# Patient Record
Sex: Male | Born: 1945 | Race: White | Hispanic: No | Smoking: Never smoker
Health system: Southern US, Community
[De-identification: ages and names within clinical notes are randomized; demographics above are authoritative.]

## PROBLEM LIST (undated history)

## (undated) DIAGNOSIS — I10 Essential (primary) hypertension: Secondary | ICD-10-CM

## (undated) DIAGNOSIS — I639 Cerebral infarction, unspecified: Secondary | ICD-10-CM

## (undated) DIAGNOSIS — T7840XA Allergy, unspecified, initial encounter: Secondary | ICD-10-CM

## (undated) HISTORY — PX: HERNIA REPAIR: SHX51

## (undated) HISTORY — DX: Cerebral infarction, unspecified: I63.9

## (undated) HISTORY — DX: Essential (primary) hypertension: I10

## (undated) HISTORY — DX: Allergy, unspecified, initial encounter: T78.40XA

## (undated) SURGERY — Surgical Case
Anesthesia: *Unknown

## (undated) SURGERY — POSTERIOR CERVICAL FUSION/FORAMINOTOMY LEVEL 5
Anesthesia: General | Laterality: Bilateral

---

## 2012-03-19 DIAGNOSIS — I69919 Unspecified symptoms and signs involving cognitive functions following unspecified cerebrovascular disease: Secondary | ICD-10-CM | POA: Diagnosis not present

## 2012-03-19 DIAGNOSIS — Z7901 Long term (current) use of anticoagulants: Secondary | ICD-10-CM | POA: Diagnosis not present

## 2012-03-21 DIAGNOSIS — E559 Vitamin D deficiency, unspecified: Secondary | ICD-10-CM | POA: Diagnosis not present

## 2012-03-21 DIAGNOSIS — E669 Obesity, unspecified: Secondary | ICD-10-CM | POA: Diagnosis not present

## 2012-03-21 DIAGNOSIS — R7301 Impaired fasting glucose: Secondary | ICD-10-CM | POA: Diagnosis not present

## 2012-03-21 DIAGNOSIS — E785 Hyperlipidemia, unspecified: Secondary | ICD-10-CM | POA: Diagnosis not present

## 2012-03-21 DIAGNOSIS — L719 Rosacea, unspecified: Secondary | ICD-10-CM | POA: Diagnosis not present

## 2012-03-21 DIAGNOSIS — I6990 Unspecified sequelae of unspecified cerebrovascular disease: Secondary | ICD-10-CM | POA: Diagnosis not present

## 2012-03-21 DIAGNOSIS — I10 Essential (primary) hypertension: Secondary | ICD-10-CM | POA: Diagnosis not present

## 2012-03-21 DIAGNOSIS — L2089 Other atopic dermatitis: Secondary | ICD-10-CM | POA: Diagnosis not present

## 2012-04-24 DIAGNOSIS — S239XXA Sprain of unspecified parts of thorax, initial encounter: Secondary | ICD-10-CM | POA: Diagnosis not present

## 2012-04-24 DIAGNOSIS — M545 Low back pain: Secondary | ICD-10-CM | POA: Diagnosis not present

## 2012-04-24 DIAGNOSIS — M5137 Other intervertebral disc degeneration, lumbosacral region: Secondary | ICD-10-CM | POA: Diagnosis not present

## 2012-04-24 DIAGNOSIS — S298XXA Other specified injuries of thorax, initial encounter: Secondary | ICD-10-CM | POA: Diagnosis not present

## 2012-04-24 DIAGNOSIS — S335XXA Sprain of ligaments of lumbar spine, initial encounter: Secondary | ICD-10-CM | POA: Diagnosis not present

## 2012-04-24 DIAGNOSIS — IMO0002 Reserved for concepts with insufficient information to code with codable children: Secondary | ICD-10-CM | POA: Diagnosis not present

## 2012-04-24 DIAGNOSIS — W07XXXA Fall from chair, initial encounter: Secondary | ICD-10-CM | POA: Diagnosis not present

## 2012-04-24 DIAGNOSIS — Z7901 Long term (current) use of anticoagulants: Secondary | ICD-10-CM | POA: Diagnosis not present

## 2012-04-24 DIAGNOSIS — Z8673 Personal history of transient ischemic attack (TIA), and cerebral infarction without residual deficits: Secondary | ICD-10-CM | POA: Diagnosis not present

## 2012-05-22 DIAGNOSIS — Z7901 Long term (current) use of anticoagulants: Secondary | ICD-10-CM | POA: Diagnosis not present

## 2012-05-22 DIAGNOSIS — I69919 Unspecified symptoms and signs involving cognitive functions following unspecified cerebrovascular disease: Secondary | ICD-10-CM | POA: Diagnosis not present

## 2012-10-24 DIAGNOSIS — H11249 Scarring of conjunctiva, unspecified eye: Secondary | ICD-10-CM | POA: Diagnosis not present

## 2012-10-24 DIAGNOSIS — I6350 Cerebral infarction due to unspecified occlusion or stenosis of unspecified cerebral artery: Secondary | ICD-10-CM | POA: Diagnosis not present

## 2012-11-13 DIAGNOSIS — E785 Hyperlipidemia, unspecified: Secondary | ICD-10-CM | POA: Diagnosis not present

## 2012-11-13 DIAGNOSIS — Z7901 Long term (current) use of anticoagulants: Secondary | ICD-10-CM | POA: Diagnosis not present

## 2013-01-16 DIAGNOSIS — Z7901 Long term (current) use of anticoagulants: Secondary | ICD-10-CM | POA: Diagnosis not present

## 2013-01-28 DIAGNOSIS — I8310 Varicose veins of unspecified lower extremity with inflammation: Secondary | ICD-10-CM | POA: Diagnosis not present

## 2013-01-28 DIAGNOSIS — L719 Rosacea, unspecified: Secondary | ICD-10-CM | POA: Diagnosis not present

## 2013-04-15 DIAGNOSIS — Z7901 Long term (current) use of anticoagulants: Secondary | ICD-10-CM | POA: Diagnosis not present

## 2013-05-15 DIAGNOSIS — Z7901 Long term (current) use of anticoagulants: Secondary | ICD-10-CM | POA: Diagnosis not present

## 2013-06-16 DIAGNOSIS — Z7901 Long term (current) use of anticoagulants: Secondary | ICD-10-CM | POA: Diagnosis not present

## 2013-07-17 DIAGNOSIS — I10 Essential (primary) hypertension: Secondary | ICD-10-CM | POA: Diagnosis not present

## 2013-07-17 DIAGNOSIS — Z7901 Long term (current) use of anticoagulants: Secondary | ICD-10-CM | POA: Diagnosis not present

## 2013-07-17 DIAGNOSIS — I6350 Cerebral infarction due to unspecified occlusion or stenosis of unspecified cerebral artery: Secondary | ICD-10-CM | POA: Diagnosis not present

## 2013-08-12 DIAGNOSIS — Z7901 Long term (current) use of anticoagulants: Secondary | ICD-10-CM | POA: Diagnosis not present

## 2013-08-12 DIAGNOSIS — Z0000 Encounter for general adult medical examination without abnormal findings: Secondary | ICD-10-CM | POA: Diagnosis not present

## 2013-08-12 DIAGNOSIS — Z5181 Encounter for therapeutic drug level monitoring: Secondary | ICD-10-CM | POA: Diagnosis not present

## 2013-08-12 DIAGNOSIS — I10 Essential (primary) hypertension: Secondary | ICD-10-CM | POA: Diagnosis not present

## 2013-09-17 DIAGNOSIS — E785 Hyperlipidemia, unspecified: Secondary | ICD-10-CM | POA: Diagnosis not present

## 2013-09-17 DIAGNOSIS — Z7901 Long term (current) use of anticoagulants: Secondary | ICD-10-CM | POA: Diagnosis not present

## 2013-10-22 DIAGNOSIS — Z7901 Long term (current) use of anticoagulants: Secondary | ICD-10-CM | POA: Diagnosis not present

## 2013-11-27 DIAGNOSIS — Z7901 Long term (current) use of anticoagulants: Secondary | ICD-10-CM | POA: Diagnosis not present

## 2013-12-24 DIAGNOSIS — Z7901 Long term (current) use of anticoagulants: Secondary | ICD-10-CM | POA: Diagnosis not present

## 2014-01-28 DIAGNOSIS — Z7901 Long term (current) use of anticoagulants: Secondary | ICD-10-CM | POA: Diagnosis not present

## 2014-02-03 DIAGNOSIS — E785 Hyperlipidemia, unspecified: Secondary | ICD-10-CM | POA: Diagnosis not present

## 2014-02-03 DIAGNOSIS — I10 Essential (primary) hypertension: Secondary | ICD-10-CM | POA: Diagnosis not present

## 2014-02-03 DIAGNOSIS — R319 Hematuria, unspecified: Secondary | ICD-10-CM | POA: Diagnosis not present

## 2014-02-03 DIAGNOSIS — Z7901 Long term (current) use of anticoagulants: Secondary | ICD-10-CM | POA: Diagnosis not present

## 2014-03-04 DIAGNOSIS — Z7901 Long term (current) use of anticoagulants: Secondary | ICD-10-CM | POA: Diagnosis not present

## 2014-03-31 DIAGNOSIS — Z7901 Long term (current) use of anticoagulants: Secondary | ICD-10-CM | POA: Diagnosis not present

## 2014-05-13 DIAGNOSIS — Z7901 Long term (current) use of anticoagulants: Secondary | ICD-10-CM | POA: Diagnosis not present

## 2014-06-17 DIAGNOSIS — Z5181 Encounter for therapeutic drug level monitoring: Secondary | ICD-10-CM | POA: Diagnosis not present

## 2014-07-13 DIAGNOSIS — J309 Allergic rhinitis, unspecified: Secondary | ICD-10-CM | POA: Diagnosis not present

## 2014-07-13 DIAGNOSIS — I639 Cerebral infarction, unspecified: Secondary | ICD-10-CM | POA: Diagnosis not present

## 2014-07-13 DIAGNOSIS — I10 Essential (primary) hypertension: Secondary | ICD-10-CM | POA: Diagnosis not present

## 2014-08-04 DIAGNOSIS — Z5181 Encounter for therapeutic drug level monitoring: Secondary | ICD-10-CM | POA: Diagnosis not present

## 2014-08-25 DIAGNOSIS — Z5181 Encounter for therapeutic drug level monitoring: Secondary | ICD-10-CM | POA: Diagnosis not present

## 2014-09-22 DIAGNOSIS — Z5181 Encounter for therapeutic drug level monitoring: Secondary | ICD-10-CM | POA: Diagnosis not present

## 2014-10-28 DIAGNOSIS — Z5181 Encounter for therapeutic drug level monitoring: Secondary | ICD-10-CM | POA: Diagnosis not present

## 2014-11-11 DIAGNOSIS — Z5181 Encounter for therapeutic drug level monitoring: Secondary | ICD-10-CM | POA: Diagnosis not present

## 2014-12-24 DIAGNOSIS — Z5181 Encounter for therapeutic drug level monitoring: Secondary | ICD-10-CM | POA: Diagnosis not present

## 2015-02-03 ENCOUNTER — Other Ambulatory Visit: Payer: Medicare Other

## 2015-02-03 DIAGNOSIS — Z7901 Long term (current) use of anticoagulants: Secondary | ICD-10-CM | POA: Diagnosis not present

## 2015-02-04 LAB — PROTIME-INR
INR: 2.2 — AB (ref 0.8–1.2)
PROTHROMBIN TIME: 23.5 s — AB (ref 9.1–12.0)

## 2015-02-15 ENCOUNTER — Other Ambulatory Visit: Payer: Self-pay | Admitting: Family Medicine

## 2015-02-15 DIAGNOSIS — I10 Essential (primary) hypertension: Secondary | ICD-10-CM

## 2015-02-28 ENCOUNTER — Other Ambulatory Visit: Payer: Self-pay | Admitting: Family Medicine

## 2015-02-28 DIAGNOSIS — I10 Essential (primary) hypertension: Secondary | ICD-10-CM

## 2015-03-28 ENCOUNTER — Other Ambulatory Visit: Payer: Self-pay | Admitting: Family Medicine

## 2015-03-28 DIAGNOSIS — I4891 Unspecified atrial fibrillation: Secondary | ICD-10-CM

## 2015-03-31 ENCOUNTER — Other Ambulatory Visit: Payer: Medicare Other

## 2015-03-31 DIAGNOSIS — Z7901 Long term (current) use of anticoagulants: Secondary | ICD-10-CM | POA: Diagnosis not present

## 2015-04-01 LAB — PROTIME-INR
INR: 2 — AB (ref 0.8–1.2)
Prothrombin Time: 20.9 s — ABNORMAL HIGH (ref 9.1–12.0)

## 2015-05-18 ENCOUNTER — Other Ambulatory Visit: Payer: Self-pay | Admitting: Family Medicine

## 2015-05-26 ENCOUNTER — Other Ambulatory Visit: Payer: Medicare Other

## 2015-05-26 DIAGNOSIS — Z7901 Long term (current) use of anticoagulants: Secondary | ICD-10-CM | POA: Diagnosis not present

## 2015-05-27 LAB — PROTIME-INR
INR: 2 — AB (ref 0.8–1.2)
Prothrombin Time: 20.4 s — ABNORMAL HIGH (ref 9.1–12.0)

## 2015-05-28 ENCOUNTER — Other Ambulatory Visit: Payer: Self-pay | Admitting: Family Medicine

## 2015-06-02 ENCOUNTER — Ambulatory Visit (INDEPENDENT_AMBULATORY_CARE_PROVIDER_SITE_OTHER): Payer: Medicare Other | Admitting: Family Medicine

## 2015-06-02 ENCOUNTER — Encounter: Payer: Self-pay | Admitting: Family Medicine

## 2015-06-02 VITALS — BP 110/70 | HR 80 | Ht 70.0 in

## 2015-06-02 DIAGNOSIS — R69 Illness, unspecified: Secondary | ICD-10-CM

## 2015-06-02 DIAGNOSIS — I4891 Unspecified atrial fibrillation: Secondary | ICD-10-CM | POA: Diagnosis not present

## 2015-06-02 DIAGNOSIS — J301 Allergic rhinitis due to pollen: Secondary | ICD-10-CM

## 2015-06-02 DIAGNOSIS — Z5181 Encounter for therapeutic drug level monitoring: Secondary | ICD-10-CM

## 2015-06-02 DIAGNOSIS — Z7901 Long term (current) use of anticoagulants: Secondary | ICD-10-CM | POA: Diagnosis not present

## 2015-06-02 DIAGNOSIS — I679 Cerebrovascular disease, unspecified: Secondary | ICD-10-CM | POA: Diagnosis not present

## 2015-06-02 DIAGNOSIS — I10 Essential (primary) hypertension: Secondary | ICD-10-CM | POA: Diagnosis not present

## 2015-06-02 MED ORDER — ATENOLOL 25 MG PO TABS: 25.0000 mg | ORAL_TABLET | Freq: Every day | ORAL | Status: DC

## 2015-06-02 MED ORDER — LISINOPRIL 40 MG PO TABS: 40.0000 mg | ORAL_TABLET | Freq: Every day | ORAL | Status: DC

## 2015-06-02 MED ORDER — WARFARIN SODIUM 3 MG PO TABS: 3.0000 mg | ORAL_TABLET | Freq: Every day | ORAL | Status: DC

## 2015-06-02 MED ORDER — FLUTICASONE PROPIONATE 50 MCG/ACT NA SUSP: 1.0000 | Freq: Every day | NASAL | Status: DC

## 2015-06-02 NOTE — Progress Notes (Signed)
Name: [name]   MRN: [id number]    DOB: 1945/11/05   Date:06/02/2015       Progress Note  Subjective  Chief Complaint  Chief Complaint  Patient presents with  . Hypertension  . Cerebrovascular Accident  . Allergic Rhinitis     Hypertension This is a chronic problem. The current episode started more than 1 year ago. The problem has been gradually improving since onset. The problem is controlled. Pertinent negatives include no anxiety, blurred vision, chest pain, headaches, malaise/fatigue, neck pain, orthopnea, palpitations, peripheral edema, PND, shortness of breath or sweats. There are no associated agents to hypertension. Risk factors for coronary artery disease include dyslipidemia and obesity. Past treatments include ACE inhibitors and beta blockers. The current treatment provides mild improvement. There are no compliance problems.  Hypertensive end-organ damage includes CVA. There is no history of angina, kidney disease, CAD/MI, heart failure, left ventricular hypertrophy, PVD, renovascular disease or retinopathy. There is no history of chronic renal disease.  Cerebrovascular Accident This is a chronic problem. The current episode started more than 1 year ago. The problem has been gradually improving. Pertinent negatives include no abdominal pain, anorexia, arthralgias, change in bowel habit, chest pain, chills, congestion, coughing, diaphoresis, fatigue, fever, headaches, joint swelling, myalgias, nausea, neck pain, numbness, rash, sore throat, urinary symptoms, vertigo, visual change, vomiting or weakness. Nothing aggravates the symptoms. He has tried nothing for the symptoms. The treatment provided mild relief.  Hyperlipidemia This is a chronic problem. The problem is controlled. Recent lipid tests were reviewed and are normal. He has no history of chronic renal disease. Pertinent negatives include no chest pain, focal weakness, myalgias or shortness of breath. The current treatment  provides moderate improvement of lipids. There are no compliance problems.  Risk factors for coronary artery disease include hypertension.    No problem-specific assessment & plan notes found for this encounter.   Past Medical History  Diagnosis Date  . Allergy   . Stroke (HCC)   . Hypertension     Past Surgical History  Procedure Laterality Date  . Hernia repair      History reviewed. No pertinent family history.  Social History   Social History  . Marital Status: Widowed    Spouse Name: N/A  . Number of Children: N/A  . Years of Education: N/A   Occupational History  . Not on file.   Social History Main Topics  . Smoking status: Never Smoker   . Smokeless tobacco: Not on file  . Alcohol Use: No  . Drug Use: No  . Sexual Activity: Not Currently   Other Topics Concern  . Not on file   Social History Narrative  . No narrative on file    Allergies  Allergen Reactions  . Latex      Review of Systems  Constitutional: Negative for fever, chills, weight loss, malaise/fatigue, diaphoresis and fatigue.  HENT: Negative for congestion, ear discharge, ear pain and sore throat.   Eyes: Negative for blurred vision.  Respiratory: Negative for cough, sputum production, shortness of breath and wheezing.   Cardiovascular: Negative for chest pain, palpitations, orthopnea, leg swelling and PND.  Gastrointestinal: Negative for heartburn, nausea, vomiting, abdominal pain, diarrhea, constipation, blood in stool, melena, anorexia and change in bowel habit.  Genitourinary: Negative for dysuria, urgency, frequency and hematuria.  Musculoskeletal: Negative for myalgias, back pain, joint pain, joint swelling, arthralgias and neck pain.  Skin: Negative for rash.  Neurological: Negative for dizziness, vertigo, tingling, sensory change, focal weakness,  weakness, numbness and headaches.  Endo/Heme/Allergies: Negative for environmental allergies and polydipsia. Does not bruise/bleed  easily.  Psychiatric/Behavioral: Negative for depression and suicidal ideas. The patient is not nervous/anxious and does not have insomnia.      Objective  Filed Vitals:   06/02/15 1012  BP: 110/70  Pulse: 80  Height: 5\' 10"  (1.778 m)    Physical Exam  Constitutional: He is oriented to person, place, and time and well-developed, well-nourished, and in no distress.  HENT:  Head: Normocephalic.  Right Ear: External ear normal.  Left Ear: External ear normal.  Nose: Nose normal.  Mouth/Throat: Oropharynx is clear and moist.  Eyes: Conjunctivae and EOM are normal. Pupils are equal, round, and reactive to light. Right eye exhibits no discharge. Left eye exhibits no discharge. No scleral icterus.  Neck: Normal range of motion. Neck supple. No JVD present. No tracheal deviation present. No thyromegaly present.  Cardiovascular: Normal rate, regular rhythm, normal heart sounds and intact distal pulses.  Exam reveals no gallop and no friction rub.   No murmur heard. Pulmonary/Chest: Breath sounds normal. No respiratory distress. He has no wheezes. He has no rales.  Abdominal: Soft. Bowel sounds are normal. He exhibits no mass. There is no hepatosplenomegaly. There is no tenderness. There is no rebound, no guarding and no CVA tenderness.  Musculoskeletal: Normal range of motion. He exhibits no edema or tenderness.  Lymphadenopathy:    He has no cervical adenopathy.  Neurological: He is alert and oriented to person, place, and time. He has normal sensation, normal strength and intact cranial nerves. No cranial nerve deficit.  Skin: Skin is warm. No rash noted.  Psychiatric: Mood and affect normal.  Nursing note and vitals reviewed.     Assessment & Plan  Problem List Items Addressed This Visit    None    Visit Diagnoses    Anticoagulated on Coumadin    -  Primary    Relevant Orders    INR/PT    Essential hypertension        Relevant Medications    atenolol (TENORMIN) 25 MG  tablet    lisinopril (PRINIVIL,ZESTRIL) 40 MG tablet    warfarin (COUMADIN) 3 MG tablet    Other Relevant Orders    Renal Function Panel    Allergic rhinitis due to pollen        Relevant Medications    fluticasone (FLONASE) 50 MCG/ACT nasal spray    Cerebral vascular disease        Relevant Medications    atenolol (TENORMIN) 25 MG tablet    lisinopril (PRINIVIL,ZESTRIL) 40 MG tablet    warfarin (COUMADIN) 3 MG tablet    Other Relevant Orders    INR/PT    Atrial fibrillation, unspecified        Relevant Medications    atenolol (TENORMIN) 25 MG tablet    lisinopril (PRINIVIL,ZESTRIL) 40 MG tablet    warfarin (COUMADIN) 3 MG tablet    Taking medication for chronic disease        Relevant Orders    INR/PT         Dr. Hayden Rasmusseneanna [place]on [facility]  06/02/2015

## 2015-06-03 LAB — RENAL FUNCTION PANEL
Albumin: 3.9 g/dL (ref 3.6–4.8)
BUN/Creatinine Ratio: 17 (ref 10–22)
BUN: 16 mg/dL (ref 8–27)
CALCIUM: 9.6 mg/dL (ref 8.6–10.2)
CO2: 26 mmol/L (ref 18–29)
CREATININE: 0.95 mg/dL (ref 0.76–1.27)
Chloride: 99 mmol/L (ref 97–106)
GFR calc Af Amer: 95 mL/min/{1.73_m2} (ref >59)
GFR, EST NON AFRICAN AMERICAN: 82 mL/min/{1.73_m2} (ref >59)
Glucose: 132 mg/dL — ABNORMAL HIGH (ref 65–99)
PHOSPHORUS: 2.5 mg/dL (ref 2.5–4.5)
Potassium: 4.3 mmol/L (ref 3.5–5.2)
SODIUM: 141 mmol/L (ref 136–144)

## 2015-06-03 LAB — PROTIME-INR
INR: 2 — ABNORMAL HIGH (ref 0.8–1.2)
PROTHROMBIN TIME: 20.6 s — AB (ref 9.1–12.0)

## 2015-07-21 ENCOUNTER — Other Ambulatory Visit: Payer: Medicare Other

## 2015-07-21 DIAGNOSIS — Z7901 Long term (current) use of anticoagulants: Secondary | ICD-10-CM | POA: Diagnosis not present

## 2015-07-22 LAB — PROTIME-INR
INR: 1.8 — AB (ref 0.8–1.2)
Prothrombin Time: 18.3 s — ABNORMAL HIGH (ref 9.1–12.0)

## 2015-08-18 ENCOUNTER — Other Ambulatory Visit: Payer: Medicare Other | Admitting: Family Medicine

## 2015-08-18 DIAGNOSIS — Z7901 Long term (current) use of anticoagulants: Secondary | ICD-10-CM | POA: Diagnosis not present

## 2015-08-19 LAB — PROTIME-INR
INR: 2.3 — ABNORMAL HIGH (ref 0.8–1.2)
Prothrombin Time: 23.9 s — ABNORMAL HIGH (ref 9.1–12.0)

## 2015-09-29 ENCOUNTER — Other Ambulatory Visit: Payer: Medicare Other

## 2015-09-29 DIAGNOSIS — Z7901 Long term (current) use of anticoagulants: Secondary | ICD-10-CM

## 2015-09-30 LAB — PROTIME-INR
INR: 2.9 — ABNORMAL HIGH (ref 0.8–1.2)
PROTHROMBIN TIME: 29.9 s — AB (ref 9.1–12.0)

## 2015-11-10 ENCOUNTER — Other Ambulatory Visit: Payer: Medicare Other

## 2015-11-10 DIAGNOSIS — Z7901 Long term (current) use of anticoagulants: Secondary | ICD-10-CM

## 2015-11-11 LAB — PROTIME-INR
INR: 2.7 — ABNORMAL HIGH (ref 0.8–1.2)
Prothrombin Time: 27.8 s — ABNORMAL HIGH (ref 9.1–12.0)

## 2015-11-30 ENCOUNTER — Ambulatory Visit (INDEPENDENT_AMBULATORY_CARE_PROVIDER_SITE_OTHER): Payer: Medicare Other | Admitting: Family Medicine

## 2015-11-30 ENCOUNTER — Encounter: Payer: Self-pay | Admitting: Family Medicine

## 2015-11-30 VITALS — BP 120/64 | HR 76 | Ht 71.0 in | Wt 241.0 lb

## 2015-11-30 DIAGNOSIS — E785 Hyperlipidemia, unspecified: Secondary | ICD-10-CM

## 2015-11-30 DIAGNOSIS — I679 Cerebrovascular disease, unspecified: Secondary | ICD-10-CM | POA: Diagnosis not present

## 2015-11-30 DIAGNOSIS — I10 Essential (primary) hypertension: Secondary | ICD-10-CM | POA: Diagnosis not present

## 2015-11-30 DIAGNOSIS — Z7901 Long term (current) use of anticoagulants: Secondary | ICD-10-CM

## 2015-11-30 DIAGNOSIS — I4891 Unspecified atrial fibrillation: Secondary | ICD-10-CM | POA: Diagnosis not present

## 2015-11-30 DIAGNOSIS — J301 Allergic rhinitis due to pollen: Secondary | ICD-10-CM | POA: Diagnosis not present

## 2015-11-30 MED ORDER — ATENOLOL 25 MG PO TABS: 25.0000 mg | ORAL_TABLET | Freq: Every day | ORAL | Status: DC

## 2015-11-30 MED ORDER — FLUTICASONE PROPIONATE 50 MCG/ACT NA SUSP: 1.0000 | Freq: Every day | NASAL | Status: DC

## 2015-11-30 MED ORDER — LISINOPRIL 40 MG PO TABS: 40.0000 mg | ORAL_TABLET | Freq: Every day | ORAL | Status: DC

## 2015-11-30 MED ORDER — WARFARIN SODIUM 3 MG PO TABS: 3.0000 mg | ORAL_TABLET | Freq: Every day | ORAL | Status: DC

## 2015-11-30 NOTE — Progress Notes (Signed)
Name: [name]   MRN: [id number]    DOB: 12/03/1945   Date:11/30/2015       Progress Note  Subjective  Chief Complaint  Chief Complaint  Patient presents with  . Hypertension  . Allergic Rhinitis   . Anticoagulation    refill coumadin    Hypertension This is a chronic problem. The current episode started more than 1 year ago. The problem has been gradually improving since onset. The problem is controlled. Pertinent negatives include no anxiety, blurred vision, chest pain, headaches, malaise/fatigue, neck pain, orthopnea, palpitations, peripheral edema, PND, shortness of breath or sweats. There are no associated agents to hypertension. There are no known risk factors for coronary artery disease.    No problem-specific assessment & plan notes found for this encounter.   Past Medical History  Diagnosis Date  . Allergy   . Stroke (HCC)   . Hypertension     Past Surgical History  Procedure Laterality Date  . Hernia repair      History reviewed. No pertinent family history.  Social History   Social History  . Marital Status: Widowed    Spouse Name: N/A  . Number of Children: N/A  . Years of Education: N/A   Occupational History  . Not on file.   Social History Main Topics  . Smoking status: Never Smoker   . Smokeless tobacco: Not on file  . Alcohol Use: No  . Drug Use: No  . Sexual Activity: Not Currently   Other Topics Concern  . Not on file   Social History Narrative    Allergies  Allergen Reactions  . Latex      Review of Systems  Constitutional: Negative for malaise/fatigue.  Eyes: Negative for blurred vision.  Respiratory: Negative for shortness of breath.   Cardiovascular: Negative for chest pain, palpitations, orthopnea and PND.  Musculoskeletal: Negative for neck pain.  Neurological: Negative for headaches.     Objective  Filed Vitals:   11/30/15 1115  BP: 120/64  Pulse: 76  Height:  (1.803 m)  Weight: 241 lb (109.317 kg)     Physical Exam  Constitutional: He is oriented to person, place, and time and well-developed, well-nourished, and in no distress.  HENT:  Head: Normocephalic.  Right Ear: External ear normal.  Left Ear: External ear normal.  Nose: Nose normal.  Mouth/Throat: Oropharynx is clear and moist.  Eyes: Conjunctivae and EOM are normal. Pupils are equal, round, and reactive to light. Right eye exhibits no discharge. Left eye exhibits no discharge. No scleral icterus.  Neck: Normal range of motion. Neck supple. No JVD present. No tracheal deviation present. No thyromegaly present.  Cardiovascular: Normal rate, regular rhythm, normal heart sounds and intact distal pulses.  Exam reveals no gallop and no friction rub.   No murmur heard. Pulmonary/Chest: Breath sounds normal. No respiratory distress. He has no wheezes. He has no rales.  Abdominal: Soft. Bowel sounds are normal. He exhibits no mass. There is no hepatosplenomegaly. There is no tenderness. There is no rebound, no guarding and no CVA tenderness.  Musculoskeletal: Normal range of motion. He exhibits no edema or tenderness.  Lymphadenopathy:    He has no cervical adenopathy.  Neurological: He is alert and oriented to person, place, and time. He has normal sensation, normal strength and intact cranial nerves. No cranial nerve deficit.  Skin: Skin is warm. No rash noted.  Psychiatric: Mood and affect normal.  Nursing note and vitals reviewed.     Assessment &  Plan  Problem List Items Addressed This Visit    None    Visit Diagnoses    Essential hypertension    -  Primary    Relevant Medications    atenolol (TENORMIN) 25 MG tablet    lisinopril (PRINIVIL,ZESTRIL) 40 MG tablet    warfarin (COUMADIN) 3 MG tablet    Other Relevant Orders    Renal Function Panel    Cerebral vascular disease        Relevant Medications    atenolol (TENORMIN) 25 MG tablet    lisinopril (PRINIVIL,ZESTRIL) 40 MG tablet    warfarin (COUMADIN) 3 MG  tablet    Other Relevant Orders    Lipid Profile    Anticoagulant long-term use        Relevant Orders    INR/PT    Allergic rhinitis due to pollen        Seasonal allergic rhinitis due to pollen        Relevant Medications    fluticasone (FLONASE) 50 MCG/ACT nasal spray    Atrial fibrillation, unspecified        Relevant Medications    atenolol (TENORMIN) 25 MG tablet    lisinopril (PRINIVIL,ZESTRIL) 40 MG tablet    warfarin (COUMADIN) 3 MG tablet    Hyperlipidemia        Relevant Medications    atenolol (TENORMIN) 25 MG tablet    lisinopril (PRINIVIL,ZESTRIL) 40 MG tablet    warfarin (COUMADIN) 3 MG tablet    Other Relevant Orders    Lipid Profile         Dr. Hayden Rasmusseneanna [place] [facility]o [facility]  11/30/2015

## 2015-12-01 LAB — RENAL FUNCTION PANEL
ALBUMIN: 3.9 g/dL (ref 3.6–4.8)
BUN/Creatinine Ratio: 16 (ref 10–24)
BUN: 16 mg/dL (ref 8–27)
CALCIUM: 9.6 mg/dL (ref 8.6–10.2)
CHLORIDE: 99 mmol/L (ref 96–106)
CO2: 29 mmol/L (ref 18–29)
Creatinine, Ser: 0.97 mg/dL (ref 0.76–1.27)
GFR calc non Af Amer: 80 mL/min/{1.73_m2} (ref >59)
GFR, EST AFRICAN AMERICAN: 92 mL/min/{1.73_m2} (ref >59)
Glucose: 149 mg/dL — ABNORMAL HIGH (ref 65–99)
PHOSPHORUS: 2.4 mg/dL — AB (ref 2.5–4.5)
POTASSIUM: 4.6 mmol/L (ref 3.5–5.2)
Sodium: 142 mmol/L (ref 134–144)

## 2015-12-01 LAB — LIPID PANEL
CHOLESTEROL TOTAL: 167 mg/dL (ref 100–199)
Chol/HDL Ratio: 4.3 ratio units (ref 0.0–5.0)
HDL: 39 mg/dL — AB (ref >39)
LDL Calculated: 96 mg/dL (ref 0–99)
TRIGLYCERIDES: 158 mg/dL — AB (ref 0–149)
VLDL CHOLESTEROL CAL: 32 mg/dL (ref 5–40)

## 2015-12-01 LAB — PROTIME-INR
INR: 2.8 — ABNORMAL HIGH (ref 0.8–1.2)
Prothrombin Time: 28.1 s — ABNORMAL HIGH (ref 9.1–12.0)

## 2016-01-12 ENCOUNTER — Other Ambulatory Visit: Payer: Medicare Other

## 2016-01-12 DIAGNOSIS — Z7901 Long term (current) use of anticoagulants: Secondary | ICD-10-CM | POA: Diagnosis not present

## 2016-01-13 LAB — PROTIME-INR
INR: 2 — ABNORMAL HIGH (ref 0.8–1.2)
PROTHROMBIN TIME: 20 s — AB (ref 9.1–12.0)

## 2016-02-23 ENCOUNTER — Other Ambulatory Visit: Payer: Medicare Other

## 2016-02-23 ENCOUNTER — Other Ambulatory Visit: Payer: Self-pay | Admitting: Internal Medicine

## 2016-02-23 DIAGNOSIS — D689 Coagulation defect, unspecified: Secondary | ICD-10-CM | POA: Diagnosis not present

## 2016-02-23 DIAGNOSIS — Z7901 Long term (current) use of anticoagulants: Secondary | ICD-10-CM | POA: Insufficient documentation

## 2016-02-24 LAB — PROTIME-INR
INR: 2.3 — AB (ref 0.8–1.2)
PROTHROMBIN TIME: 23.5 s — AB (ref 9.1–12.0)

## 2016-04-05 ENCOUNTER — Other Ambulatory Visit: Payer: Medicare Other

## 2016-04-05 DIAGNOSIS — Z7901 Long term (current) use of anticoagulants: Secondary | ICD-10-CM | POA: Diagnosis not present

## 2016-04-06 ENCOUNTER — Other Ambulatory Visit: Payer: Self-pay

## 2016-04-06 LAB — PROTIME-INR
INR: 3.2 — ABNORMAL HIGH (ref 0.8–1.2)
Prothrombin Time: 31.5 s — ABNORMAL HIGH (ref 9.1–12.0)

## 2016-04-26 ENCOUNTER — Other Ambulatory Visit: Payer: Medicare Other

## 2016-04-26 DIAGNOSIS — Z7901 Long term (current) use of anticoagulants: Secondary | ICD-10-CM

## 2016-04-27 LAB — PROTIME-INR
INR: 2.1 — AB (ref 0.8–1.2)
Prothrombin Time: 21.3 s — ABNORMAL HIGH (ref 9.1–12.0)

## 2016-05-30 ENCOUNTER — Other Ambulatory Visit: Payer: Self-pay | Admitting: Family Medicine

## 2016-05-30 DIAGNOSIS — I4891 Unspecified atrial fibrillation: Secondary | ICD-10-CM

## 2016-05-31 ENCOUNTER — Encounter: Payer: Self-pay | Admitting: Family Medicine

## 2016-05-31 ENCOUNTER — Ambulatory Visit (INDEPENDENT_AMBULATORY_CARE_PROVIDER_SITE_OTHER): Payer: Medicare Other | Admitting: Family Medicine

## 2016-05-31 VITALS — BP 120/70 | HR 76 | Ht 71.0 in | Wt 247.0 lb

## 2016-05-31 DIAGNOSIS — Z7901 Long term (current) use of anticoagulants: Secondary | ICD-10-CM

## 2016-05-31 DIAGNOSIS — J301 Allergic rhinitis due to pollen: Secondary | ICD-10-CM | POA: Diagnosis not present

## 2016-05-31 DIAGNOSIS — I10 Essential (primary) hypertension: Secondary | ICD-10-CM | POA: Diagnosis not present

## 2016-05-31 DIAGNOSIS — I679 Cerebrovascular disease, unspecified: Secondary | ICD-10-CM

## 2016-05-31 MED ORDER — ATENOLOL 25 MG PO TABS: 25.0000 mg | ORAL_TABLET | Freq: Every day | ORAL | 1 refills | Status: DC

## 2016-05-31 MED ORDER — WARFARIN SODIUM 3 MG PO TABS: 3.0000 mg | ORAL_TABLET | Freq: Every day | ORAL | 1 refills | Status: DC

## 2016-05-31 MED ORDER — FLUTICASONE PROPIONATE 50 MCG/ACT NA SUSP
1.0000 | Freq: Every day | NASAL | 11 refills | Status: DC
Start: 2016-05-31 — End: 2016-11-30

## 2016-05-31 MED ORDER — LISINOPRIL 40 MG PO TABS: 40.0000 mg | ORAL_TABLET | Freq: Every day | ORAL | 1 refills | Status: DC

## 2016-05-31 NOTE — Progress Notes (Signed)
Name: [name]   MRN: [id number]    DOB: 08/26/1945   Date:05/31/2016       Progress Note  Subjective  Chief Complaint  Chief Complaint  Patient presents with  . Hypertension  . Allergic Rhinitis   . Cerebrovascular Accident    takes Warfarin for prevention    Patient has history of atrial fibrillation.   Hypertension  This is a chronic problem. The current episode started more than 1 year ago. The problem has been gradually improving since onset. The problem is controlled. Pertinent negatives include no anxiety, blurred vision, chest pain, headaches, malaise/fatigue, neck pain, orthopnea, palpitations, peripheral edema, PND, shortness of breath or sweats. There are no associated agents to hypertension. Past treatments include beta blockers and ACE inhibitors. The current treatment provides mild improvement. There are no compliance problems.  There is no history of angina, kidney disease, CAD/MI, CVA, heart failure, left ventricular hypertrophy, PVD, renovascular disease or retinopathy. There is no history of chronic renal disease or a hypertension causing med.  Cerebrovascular Accident  This is a chronic problem. The current episode started more than 1 year ago. The problem has been gradually improving. Pertinent negatives include no abdominal pain, anorexia, arthralgias, change in bowel habit, chest pain, chills, congestion, coughing, diaphoresis, fatigue, fever, headaches, joint swelling, myalgias, nausea, neck pain, numbness, rash, sore throat, swollen glands, urinary symptoms, vertigo, visual change, vomiting or weakness. Nothing aggravates the symptoms. He has tried nothing for the symptoms. The treatment provided mild relief.    No problem-specific Assessment & Plan notes found for this encounter.   Past Medical History:  Diagnosis Date  . Allergy   . Hypertension   . Stroke [facility](HCC)     Past Surgical History:  Procedure Laterality Date  . HERNIA REPAIR      No family  history on file.  Social History   Social History  . Marital status: Widowed    Spouse name: N/A  . Number of children: N/A  . Years of education: N/A   Occupational History  . Not on file.   Social History Main Topics  . Smoking status: Never Smoker  . Smokeless tobacco: Not on file  . Alcohol use No  . Drug use: No  . Sexual activity: Not Currently   Other Topics Concern  . Not on file   Social History Narrative  . No narrative on file    Allergies  Allergen Reactions  . Latex      Review of Systems  Constitutional: Negative for chills, diaphoresis, fatigue, fever, malaise/fatigue and weight loss.  HENT: Negative for congestion, ear discharge, ear pain and sore throat.   Eyes: Negative for blurred vision.  Respiratory: Negative for cough, sputum production, shortness of breath and wheezing.   Cardiovascular: Negative for chest pain, palpitations, orthopnea, leg swelling and PND.  Gastrointestinal: Negative for abdominal pain, anorexia, blood in stool, change in bowel habit, constipation, diarrhea, heartburn, melena, nausea and vomiting.  Genitourinary: Negative for dysuria, frequency, hematuria and urgency.  Musculoskeletal: Negative for arthralgias, back pain, joint pain, joint swelling, myalgias and neck pain.  Skin: Negative for rash.  Neurological: Negative for dizziness, vertigo, tingling, sensory change, focal weakness, weakness, numbness and headaches.  Endo/Heme/Allergies: Negative for environmental allergies and polydipsia. Does not bruise/bleed easily.  Psychiatric/Behavioral: Negative for depression and suicidal ideas. The patient is not nervous/anxious and does not have insomnia.      Objective  Vitals:   05/31/16 1046  BP: 120/70  Pulse: 76  Weight:  247 lb (112 kg)  Height: 5\' 11"  (1.803 m)    Physical Exam  Constitutional: He is oriented to person, place, and time and well-developed, well-nourished, and in no distress.  HENT:  Head:  Normocephalic.  Right Ear: External ear normal.  Left Ear: External ear normal.  Nose: Nose normal.  Mouth/Throat: Oropharynx is clear and moist.  Eyes: Conjunctivae and EOM are normal. Pupils are equal, round, and reactive to light. Right eye exhibits no discharge. Left eye exhibits no discharge. No scleral icterus.  Neck: Normal range of motion. Neck supple. No JVD present. No tracheal deviation present. No thyromegaly present.  Cardiovascular: Normal rate, regular rhythm, normal heart sounds and intact distal pulses.  Exam reveals no gallop and no friction rub.   No murmur heard. Pulmonary/Chest: Breath sounds normal. No respiratory distress. He has no wheezes. He has no rales.  Abdominal: Soft. Bowel sounds are normal. He exhibits no mass. There is no hepatosplenomegaly. There is no tenderness. There is no rebound, no guarding and no CVA tenderness.  Musculoskeletal: Normal range of motion. He exhibits no edema or tenderness.  Lymphadenopathy:    He has no cervical adenopathy.  Neurological: He is alert and oriented to person, place, and time. He has normal sensation, normal strength, normal reflexes and intact cranial nerves. No cranial nerve deficit.  Skin: Skin is warm. No rash noted.  Psychiatric: Mood and affect normal.  Nursing note and vitals reviewed.     Assessment & Plan  Problem List Items Addressed This Visit    None    Visit Diagnoses    Essential hypertension    -  Primary   Relevant Medications   warfarin (COUMADIN) 3 MG tablet   atenolol (TENORMIN) 25 MG tablet   lisinopril (PRINIVIL,ZESTRIL) 40 MG tablet   Other Relevant Orders   Renal Function Panel   Cerebral vascular disease       Relevant Medications   warfarin (COUMADIN) 3 MG tablet   atenolol (TENORMIN) 25 MG tablet   lisinopril (PRINIVIL,ZESTRIL) 40 MG tablet   Other Relevant Orders   INR/PT   Acute seasonal allergic rhinitis due to pollen       Chronic seasonal allergic rhinitis due to pollen        Relevant Medications   fluticasone (FLONASE) 50 MCG/ACT nasal spray   Anticoagulant long-term use       Relevant Orders   INR/PT        Dr. Hayden [facility]  05/31/16

## 2016-06-01 LAB — RENAL FUNCTION PANEL
Albumin: 4 g/dL (ref 3.6–4.8)
BUN / CREAT RATIO: 18 (ref 10–24)
BUN: 18 mg/dL (ref 8–27)
CHLORIDE: 99 mmol/L (ref 96–106)
CO2: 28 mmol/L (ref 18–29)
Calcium: 9.6 mg/dL (ref 8.6–10.2)
Creatinine, Ser: 1.01 mg/dL (ref 0.76–1.27)
GFR calc non Af Amer: 76 mL/min/{1.73_m2} (ref >59)
GFR, EST AFRICAN AMERICAN: 87 mL/min/{1.73_m2} (ref >59)
GLUCOSE: 171 mg/dL — AB (ref 65–99)
POTASSIUM: 4.8 mmol/L (ref 3.5–5.2)
Phosphorus: 2.4 mg/dL — ABNORMAL LOW (ref 2.5–4.5)
SODIUM: 141 mmol/L (ref 134–144)

## 2016-06-01 LAB — PROTIME-INR
INR: 2.2 — ABNORMAL HIGH (ref 0.8–1.2)
Prothrombin Time: 22.4 s — ABNORMAL HIGH (ref 9.1–12.0)

## 2016-07-13 ENCOUNTER — Other Ambulatory Visit: Payer: Medicare Other

## 2016-07-13 DIAGNOSIS — D689 Coagulation defect, unspecified: Secondary | ICD-10-CM

## 2016-07-14 LAB — PROTIME-INR
INR: 2 — AB (ref 0.8–1.2)
Prothrombin Time: 20.7 s — ABNORMAL HIGH (ref 9.1–12.0)

## 2016-08-30 ENCOUNTER — Other Ambulatory Visit: Payer: Self-pay

## 2016-08-30 ENCOUNTER — Other Ambulatory Visit: Payer: Medicare Other

## 2016-08-30 DIAGNOSIS — Z7901 Long term (current) use of anticoagulants: Secondary | ICD-10-CM

## 2016-08-31 ENCOUNTER — Other Ambulatory Visit: Payer: Self-pay | Admitting: Family Medicine

## 2016-08-31 DIAGNOSIS — I4891 Unspecified atrial fibrillation: Secondary | ICD-10-CM

## 2016-08-31 LAB — PROTIME-INR
INR: 2.3 — ABNORMAL HIGH (ref 0.8–1.2)
Prothrombin Time: 23.1 s — ABNORMAL HIGH (ref 9.1–12.0)

## 2016-10-04 ENCOUNTER — Other Ambulatory Visit: Payer: Medicare Other

## 2016-10-04 DIAGNOSIS — Z7901 Long term (current) use of anticoagulants: Secondary | ICD-10-CM | POA: Diagnosis not present

## 2016-10-05 LAB — PROTIME-INR
INR: 1.8 — ABNORMAL HIGH (ref 0.8–1.2)
Prothrombin Time: 18.3 s — ABNORMAL HIGH (ref 9.1–12.0)

## 2016-11-01 ENCOUNTER — Other Ambulatory Visit: Payer: Medicare Other

## 2016-11-01 DIAGNOSIS — Z7901 Long term (current) use of anticoagulants: Secondary | ICD-10-CM

## 2016-11-01 DIAGNOSIS — R69 Illness, unspecified: Secondary | ICD-10-CM | POA: Diagnosis not present

## 2016-11-02 LAB — PROTIME-INR
INR: 1.9 — AB (ref 0.8–1.2)
PROTHROMBIN TIME: 19.3 s — AB (ref 9.1–12.0)

## 2016-11-26 ENCOUNTER — Other Ambulatory Visit: Payer: Self-pay | Admitting: Family Medicine

## 2016-11-30 ENCOUNTER — Ambulatory Visit (INDEPENDENT_AMBULATORY_CARE_PROVIDER_SITE_OTHER): Payer: Medicare Other | Admitting: Family Medicine

## 2016-11-30 ENCOUNTER — Encounter: Payer: Self-pay | Admitting: Family Medicine

## 2016-11-30 VITALS — BP 120/70 | HR 88 | Ht 71.0 in | Wt 238.0 lb

## 2016-11-30 DIAGNOSIS — I10 Essential (primary) hypertension: Secondary | ICD-10-CM | POA: Diagnosis not present

## 2016-11-30 DIAGNOSIS — I679 Cerebrovascular disease, unspecified: Secondary | ICD-10-CM | POA: Diagnosis not present

## 2016-11-30 DIAGNOSIS — Z7901 Long term (current) use of anticoagulants: Secondary | ICD-10-CM

## 2016-11-30 DIAGNOSIS — J301 Allergic rhinitis due to pollen: Secondary | ICD-10-CM

## 2016-11-30 MED ORDER — LISINOPRIL 40 MG PO TABS: 40.0000 mg | ORAL_TABLET | Freq: Every day | ORAL | 1 refills | Status: DC

## 2016-11-30 MED ORDER — ATENOLOL 25 MG PO TABS: 25.0000 mg | ORAL_TABLET | Freq: Every day | ORAL | 1 refills | Status: DC

## 2016-11-30 MED ORDER — WARFARIN SODIUM 3 MG PO TABS: 3.0000 mg | ORAL_TABLET | Freq: Every day | ORAL | 1 refills | Status: DC

## 2016-11-30 MED ORDER — FLUTICASONE PROPIONATE 50 MCG/ACT NA SUSP: 1.0000 | Freq: Every day | NASAL | 11 refills | Status: DC

## 2016-11-30 NOTE — Progress Notes (Signed)
Name: Ronav Furney   MRN: [id number]    DOB: 10-Aug-1945   Date:11/30/2016       Progress Note  Subjective  Chief Complaint  Chief Complaint  Patient presents with  . Hypertension  . Allergic Rhinitis   . Cerebrovascular Accident    takes warfarin for    Hypertension  This is a chronic problem. The current episode started more than 1 year ago. The problem has been waxing and waning since onset. The problem is controlled. Pertinent negatives include no anxiety, blurred vision, chest pain, headaches, malaise/fatigue, neck pain, orthopnea, palpitations, peripheral edema, PND, shortness of breath or sweats. There are no associated agents to hypertension. Risk factors for coronary artery disease include dyslipidemia. Past treatments include ACE inhibitors and beta blockers. The current treatment provides mild improvement. There are no compliance problems.  There is no history of angina, kidney disease, CAD/MI, CVA, heart failure, left ventricular hypertrophy, PVD or retinopathy. There is no history of chronic renal disease, a hypertension causing med or renovascular disease.  Cerebrovascular Accident  This is a chronic problem. The current episode started more than 1 year ago. Pertinent negatives include no abdominal pain, chest pain, chills, coughing, fever, headaches, myalgias, nausea, neck pain, rash or sore throat. Nothing aggravates the symptoms.    No problem-specific Assessment & Plan notes found for this encounter.   Past Medical History:  Diagnosis Date  . Allergy   . Hypertension   . Stroke [facility])     Past Surgical History:  Procedure Laterality Date  . HERNIA REPAIR      No family history on file.  Social History   Social History  . Marital status: Widowed    Spouse name: N/A  . Number of children: N/A  . Years of education: N/A   Occupational History  . Not on file.   Social History Main Topics  . Smoking status: Never Smoker  . Smokeless tobacco: Never Used   . Alcohol use No  . Drug use: No  . Sexual activity: Not Currently   Other Topics Concern  . Not on file   Social History Narrative  . No narrative on file    Allergies  Allergen Reactions  . Latex     Outpatient Medications Prior to Visit  Medication Sig Dispense Refill  . atenolol (TENORMIN) 25 MG tablet Take 1 tablet (25 mg total) by mouth daily. 90 tablet 1  . fluticasone (FLONASE) 50 MCG/ACT nasal spray Place 1 spray into both nostrils daily. 1 g 11  . lisinopril (PRINIVIL,ZESTRIL) 40 MG tablet Take 1 tablet (40 mg total) by mouth daily. 90 tablet 1  . warfarin (COUMADIN) 3 MG tablet Take 1 tablet (3 mg total) by mouth daily. 90 tablet 1  . warfarin (COUMADIN) 3 MG tablet TAKE 1 TABLET DAILY (SCHEDULE APPOINTMENT TO BE SEEN) 90 tablet 0  . warfarin (COUMADIN) 3 MG tablet TAKE 1 TABLET DAILY (SCHEDULE APPOINTMENT TO BE SEEN) 90 tablet 0   No facility-administered medications prior to visit.     Review of Systems  Constitutional: Negative for chills, fever, malaise/fatigue and weight loss.  HENT: Negative for ear discharge, ear pain and sore throat.   Eyes: Negative for blurred vision.  Respiratory: Negative for cough, sputum production, shortness of breath and wheezing.   Cardiovascular: Negative for chest pain, palpitations, orthopnea, leg swelling and PND.  Gastrointestinal: Negative for abdominal pain, blood in stool, constipation, diarrhea, heartburn, melena and nausea.  Genitourinary: Negative for dysuria, frequency, hematuria and urgency.  Musculoskeletal: Negative for back pain, joint pain, myalgias and neck pain.  Skin: Negative for rash.  Neurological: Negative for dizziness, tingling, sensory change, focal weakness and headaches.  Endo/Heme/Allergies: Negative for environmental allergies and polydipsia. Does not bruise/bleed easily.  Psychiatric/Behavioral: Negative for depression and suicidal ideas. The patient is not nervous/anxious and does not have  insomnia.      Objective  Vitals:   11/30/16 1011  BP: 120/70  Pulse: 88  Weight: 238 lb (108 kg)  Height:  (1.803 m)    Physical Exam  Constitutional: He is oriented to person, place, and time and well-developed, well-nourished, and in no distress.  HENT:  Head: Normocephalic.  Right Ear: External ear normal.  Left Ear: External ear normal.  Nose: Nose normal.  Mouth/Throat: Oropharynx is clear and moist.  Eyes: Conjunctivae and EOM are normal. Pupils are equal, round, and reactive to light. Right eye exhibits no discharge. Left eye exhibits no discharge. No scleral icterus.  Neck: Normal range of motion. Neck supple. No JVD present. No tracheal deviation present. No thyromegaly present.  Cardiovascular: Normal rate, regular rhythm, normal heart sounds and intact distal pulses.  Exam reveals no gallop and no friction rub.   No murmur heard. Pulmonary/Chest: Breath sounds normal. No respiratory distress. He has no wheezes. He has no rales.  Abdominal: Soft. Bowel sounds are normal. He exhibits no mass. There is no hepatosplenomegaly. There is no tenderness. There is no rebound, no guarding and no CVA tenderness.  Musculoskeletal: Normal range of motion. He exhibits no edema or tenderness.  Lymphadenopathy:    He has no cervical adenopathy.  Neurological: He is alert and oriented to person, place, and time. He has normal sensation, normal strength, normal reflexes and intact cranial nerves. No cranial nerve deficit.  Skin: Skin is warm. No rash noted.  Psychiatric: Mood and affect normal.  Nursing note and vitals reviewed.     Assessment & Plan  Problem List Items Addressed This Visit    None    Visit Diagnoses    Essential hypertension    -  Primary   Relevant Medications   warfarin (COUMADIN) 3 MG tablet   lisinopril (PRINIVIL,ZESTRIL) 40 MG tablet   atenolol (TENORMIN) 25 MG tablet   Other Relevant Orders   Renal Function Panel   Lipid panel   Cerebral  vascular disease       Relevant Medications   warfarin (COUMADIN) 3 MG tablet   lisinopril (PRINIVIL,ZESTRIL) 40 MG tablet   atenolol (TENORMIN) 25 MG tablet   Other Relevant Orders   Protime-INR   Chronic seasonal allergic rhinitis due to pollen       Relevant Medications   fluticasone (FLONASE) 50 MCG/ACT nasal spray   Chronic anticoagulation       Relevant Orders   Protime-INR      Meds ordered this encounter  Medications  . DISCONTD: atenolol (TENORMIN) 25 MG tablet    Sig: Take 1 tablet (25 mg total) by mouth daily.    Dispense:  90 tablet    Refill:  1  . DISCONTD: lisinopril (PRINIVIL,ZESTRIL) 40 MG tablet    Sig: Take 1 tablet (40 mg total) by mouth daily.    Dispense:  90 tablet    Refill:  1  . DISCONTD: warfarin (COUMADIN) 3 MG tablet    Sig: Take 1 tablet (3 mg total) by mouth daily.    Dispense:  90 tablet    Refill:  1    sched appt  to be seen  . DISCONTD: fluticasone (FLONASE) 50 MCG/ACT nasal spray    Sig: Place 1 spray into both nostrils daily.    Dispense:  1 g    Refill:  11  . warfarin (COUMADIN) 3 MG tablet    Sig: Take 1 tablet (3 mg total) by mouth daily.    Dispense:  90 tablet    Refill:  1  . fluticasone (FLONASE) 50 MCG/ACT nasal spray    Sig: Place 1 spray into both nostrils daily.    Dispense:  16 g    Refill:  11  . lisinopril (PRINIVIL,ZESTRIL) 40 MG tablet    Sig: Take 1 tablet (40 mg total) by mouth daily.    Dispense:  90 tablet    Refill:  1  . atenolol (TENORMIN) 25 MG tablet    Sig: Take 1 tablet (25 mg total) by mouth daily.    Dispense:  90 tablet    Refill:  1      Dr. Elizabeth Sauer [facility]  11/30/16

## 2016-12-01 LAB — RENAL FUNCTION PANEL
ALBUMIN: 4.2 g/dL (ref 3.6–4.8)
BUN/Creatinine Ratio: 16 (ref 10–24)
BUN: 16 mg/dL (ref 8–27)
CALCIUM: 9.6 mg/dL (ref 8.6–10.2)
CHLORIDE: 94 mmol/L — AB (ref 96–106)
CO2: 27 mmol/L (ref 18–29)
Creatinine, Ser: 1.03 mg/dL (ref 0.76–1.27)
GFR calc Af Amer: 85 mL/min/{1.73_m2} (ref >59)
GFR calc non Af Amer: 74 mL/min/{1.73_m2} (ref >59)
GLUCOSE: 140 mg/dL — AB (ref 65–99)
PHOSPHORUS: 1.9 mg/dL — AB (ref 2.5–4.5)
POTASSIUM: 4.5 mmol/L (ref 3.5–5.2)
SODIUM: 134 mmol/L (ref 134–144)

## 2016-12-01 LAB — LIPID PANEL
CHOL/HDL RATIO: 4 ratio (ref 0.0–5.0)
Cholesterol, Total: 159 mg/dL (ref 100–199)
HDL: 40 mg/dL (ref >39)
LDL Calculated: 88 mg/dL (ref 0–99)
TRIGLYCERIDES: 154 mg/dL — AB (ref 0–149)
VLDL Cholesterol Cal: 31 mg/dL (ref 5–40)

## 2016-12-01 LAB — PROTIME-INR
INR: 1.7 — ABNORMAL HIGH (ref 0.8–1.2)
Prothrombin Time: 17.4 s — ABNORMAL HIGH (ref 9.1–12.0)

## 2016-12-14 ENCOUNTER — Other Ambulatory Visit: Payer: Self-pay

## 2016-12-14 DIAGNOSIS — R748 Abnormal levels of other serum enzymes: Secondary | ICD-10-CM | POA: Diagnosis not present

## 2016-12-15 LAB — PHOSPHORUS: Phosphorus: 1.5 mg/dL — ABNORMAL LOW (ref 2.5–4.5)

## 2016-12-18 ENCOUNTER — Other Ambulatory Visit: Payer: Medicare Other

## 2016-12-18 DIAGNOSIS — Z8639 Personal history of other endocrine, nutritional and metabolic disease: Secondary | ICD-10-CM

## 2016-12-19 LAB — PTH, INTACT AND CALCIUM
Calcium: 9.2 mg/dL (ref 8.6–10.2)
PTH: 25 pg/mL (ref 15–65)

## 2017-01-03 ENCOUNTER — Other Ambulatory Visit: Payer: Medicare Other

## 2017-01-03 DIAGNOSIS — Z7901 Long term (current) use of anticoagulants: Secondary | ICD-10-CM

## 2017-01-04 LAB — PROTIME-INR
INR: 1.7 — AB (ref 0.8–1.2)
Prothrombin Time: 17 s — ABNORMAL HIGH (ref 9.1–12.0)

## 2017-02-14 ENCOUNTER — Other Ambulatory Visit: Payer: Medicare Other

## 2017-02-14 DIAGNOSIS — Z7901 Long term (current) use of anticoagulants: Secondary | ICD-10-CM | POA: Diagnosis not present

## 2017-02-15 LAB — PROTIME-INR
INR: 2 — ABNORMAL HIGH (ref 0.8–1.2)
Prothrombin Time: 19.9 s — ABNORMAL HIGH (ref 9.1–12.0)

## 2017-03-28 ENCOUNTER — Other Ambulatory Visit: Payer: Medicare Other

## 2017-03-28 DIAGNOSIS — Z7901 Long term (current) use of anticoagulants: Secondary | ICD-10-CM

## 2017-03-29 LAB — PROTIME-INR
INR: 1.8 — AB (ref 0.8–1.2)
PROTHROMBIN TIME: 18.2 s — AB (ref 9.1–12.0)

## 2017-04-25 ENCOUNTER — Other Ambulatory Visit: Payer: Medicare Other

## 2017-04-25 DIAGNOSIS — Z7901 Long term (current) use of anticoagulants: Secondary | ICD-10-CM

## 2017-04-26 LAB — PROTIME-INR
INR: 2 — AB (ref 0.8–1.2)
Prothrombin Time: 20.3 s — ABNORMAL HIGH (ref 9.1–12.0)

## 2017-05-29 ENCOUNTER — Ambulatory Visit (INDEPENDENT_AMBULATORY_CARE_PROVIDER_SITE_OTHER): Payer: Medicare Other | Admitting: Family Medicine

## 2017-05-29 ENCOUNTER — Encounter: Payer: Self-pay | Admitting: Family Medicine

## 2017-05-29 VITALS — BP 120/70 | HR 76 | Ht 71.0 in | Wt 239.0 lb

## 2017-05-29 DIAGNOSIS — J301 Allergic rhinitis due to pollen: Secondary | ICD-10-CM

## 2017-05-29 DIAGNOSIS — Z7901 Long term (current) use of anticoagulants: Secondary | ICD-10-CM

## 2017-05-29 DIAGNOSIS — I679 Cerebrovascular disease, unspecified: Secondary | ICD-10-CM | POA: Diagnosis not present

## 2017-05-29 DIAGNOSIS — I10 Essential (primary) hypertension: Secondary | ICD-10-CM

## 2017-05-29 MED ORDER — WARFARIN SODIUM 3 MG PO TABS: 3.0000 mg | ORAL_TABLET | Freq: Every day | ORAL | 1 refills | Status: DC

## 2017-05-29 MED ORDER — LISINOPRIL 40 MG PO TABS: 40.0000 mg | ORAL_TABLET | Freq: Every day | ORAL | 1 refills | Status: DC

## 2017-05-29 MED ORDER — ATENOLOL 25 MG PO TABS: 25.0000 mg | ORAL_TABLET | Freq: Every day | ORAL | 1 refills | Status: DC

## 2017-05-29 MED ORDER — FLUTICASONE PROPIONATE 50 MCG/ACT NA SUSP: 1.0000 | Freq: Every day | NASAL | 11 refills | Status: DC

## 2017-05-29 NOTE — Progress Notes (Signed)
Name: [name]   MRN: [id number]    DOB: 1946-06-16   Date:05/29/2017       Progress Note  Subjective  Chief Complaint  Chief Complaint  Patient presents with  . Allergic Rhinitis   . Hypertension  . Cerebrovascular Accident    takes warfarin for this    Hypertension  This is a chronic problem. The current episode started more than 1 year ago. The problem has been gradually improving since onset. The problem is controlled. Pertinent negatives include no anxiety, blurred vision, chest pain, headaches, malaise/fatigue, neck pain, orthopnea, palpitations, peripheral edema, PND, shortness of breath or sweats. There are no associated agents to hypertension. There are no known risk factors for coronary artery disease. Past treatments include ACE inhibitors and beta blockers. The current treatment provides moderate improvement. There are no compliance problems.  There is no history of angina, kidney disease, CAD/MI, CVA, heart failure, left ventricular hypertrophy, PVD or retinopathy. There is no history of chronic renal disease, a hypertension causing med or renovascular disease.  Cerebrovascular Accident  This is a chronic problem. The current episode started more than 1 year ago. The problem occurs intermittently. The problem has been gradually worsening. Pertinent negatives include no abdominal pain, anorexia, arthralgias, change in bowel habit, chest pain, chills, congestion, coughing, diaphoresis, fatigue, fever, headaches, joint swelling, myalgias, nausea, neck pain, numbness, rash, sore throat, swollen glands, urinary symptoms, vertigo, visual change, vomiting or weakness. Nothing aggravates the symptoms. Treatments tried: anticoagulation. The treatment provided moderate relief.  URI   This is a chronic problem. The current episode started more than 1 year ago. The problem has been waxing and waning. Pertinent negatives include no abdominal pain, chest pain, congestion, coughing, headaches,  nausea, neck pain, rash, sore throat, swollen glands or vomiting. He has tried acetaminophen for the symptoms. The treatment provided moderate relief.    No problem-specific Assessment & Plan notes found for this encounter.   Past Medical History:  Diagnosis Date  . Allergy   . Hypertension   . Stroke [facility])     Past Surgical History:  Procedure Laterality Date  . HERNIA REPAIR      No family history on file.  Social History   Social History  . Marital status: Widowed    Spouse name: N/A  . Number of children: N/A  . Years of education: N/A   Occupational History  . Not on file.   Social History Main Topics  . Smoking status: Never Smoker  . Smokeless tobacco: Never Used  . Alcohol use No  . Drug use: No  . Sexual activity: Not Currently   Other Topics Concern  . Not on file   Social History Narrative  . No narrative on file    Allergies  Allergen Reactions  . Latex     Outpatient Medications Prior to Visit  Medication Sig Dispense Refill  . atenolol (TENORMIN) 25 MG tablet Take 1 tablet (25 mg total) by mouth daily. 90 tablet 1  . fluticasone (FLONASE) 50 MCG/ACT nasal spray Place 1 spray into both nostrils daily. 16 g 11  . lisinopril (PRINIVIL,ZESTRIL) 40 MG tablet Take 1 tablet (40 mg total) by mouth daily. 90 tablet 1  . warfarin (COUMADIN) 3 MG tablet Take 1 tablet (3 mg total) by mouth daily. 90 tablet 1   No facility-administered medications prior to visit.     Review of Systems  Constitutional: Negative for chills, diaphoresis, fatigue, fever and malaise/fatigue.  HENT: Negative for congestion and  sore throat.   Eyes: Negative for blurred vision.  Respiratory: Negative for cough and shortness of breath.   Cardiovascular: Negative for chest pain, palpitations, orthopnea and PND.  Gastrointestinal: Negative for abdominal pain, anorexia, change in bowel habit, nausea and vomiting.  Musculoskeletal: Negative for arthralgias, joint swelling,  myalgias and neck pain.  Skin: Negative for rash.  Neurological: Negative for vertigo, weakness, numbness and headaches.     Objective  Vitals:   05/29/17 0916  BP: 120/70  Pulse: 76  Weight: 239 lb (108.4 kg)  Height: 5\' 11"  (1.803 m)    Physical Exam  Constitutional: He is oriented to person, place, and time and well-developed, well-nourished, and in no distress.  HENT:  Head: Normocephalic.  Right Ear: External ear normal.  Left Ear: External ear normal.  Nose: Nose normal.  Mouth/Throat: Oropharynx is clear and moist.  Eyes: Pupils are equal, round, and reactive to light. Conjunctivae and EOM are normal. Right eye exhibits no discharge. Left eye exhibits no discharge. No scleral icterus.  Neck: Normal range of motion. Neck supple. No JVD present. No tracheal deviation present. No thyromegaly present.  Cardiovascular: Normal rate, regular rhythm, normal heart sounds and intact distal pulses.  Exam reveals no gallop and no friction rub.   No murmur heard. Pulmonary/Chest: Breath sounds normal. No respiratory distress. He has no wheezes. He has no rales.  Abdominal: Soft. Bowel sounds are normal. He exhibits no mass. There is no hepatosplenomegaly. There is no tenderness. There is no rebound, no guarding and no CVA tenderness.  Musculoskeletal: Normal range of motion. He exhibits no edema or tenderness.  Lymphadenopathy:    He has no cervical adenopathy.  Neurological: He is alert and oriented to person, place, and time. He has normal sensation, normal strength, normal reflexes and intact cranial nerves. No cranial nerve deficit.  Skin: Skin is warm. No rash noted.  Psychiatric: Mood and affect normal.  Nursing note and vitals reviewed.     Assessment & Plan  Problem List Items Addressed This Visit    None    Visit Diagnoses    Anticoagulant long-term use    -  Primary   Relevant Orders   INR/PT   Cerebral vascular disease       Relevant Medications   warfarin  (COUMADIN) 3 MG tablet   atenolol (TENORMIN) 25 MG tablet   lisinopril (PRINIVIL,ZESTRIL) 40 MG tablet   Other Relevant Orders   INR/PT   Chronic seasonal allergic rhinitis due to pollen       Relevant Medications   fluticasone (FLONASE) 50 MCG/ACT nasal spray   Essential hypertension       Relevant Medications   warfarin (COUMADIN) 3 MG tablet   atenolol (TENORMIN) 25 MG tablet   lisinopril (PRINIVIL,ZESTRIL) 40 MG tablet   Other Relevant Orders   Renal Function Panel      Meds ordered this encounter  Medications  . warfarin (COUMADIN) 3 MG tablet    Sig: Take 1 tablet (3 mg total) by mouth daily.    Dispense:  90 tablet    Refill:  1  . fluticasone (FLONASE) 50 MCG/ACT nasal spray    Sig: Place 1 spray into both nostrils daily.    Dispense:  16 g    Refill:  11  . atenolol (TENORMIN) 25 MG tablet    Sig: Take 1 tablet (25 mg total) by mouth daily.    Dispense:  90 tablet    Refill:  1  . lisinopril (  PRINIVIL,ZESTRIL) 40 MG tablet    Sig: Take 1 tablet (40 mg total) by mouth daily.    Dispense:  90 tablet    Refill:  1      Dr. Elizabeth Sauereanna Jones [facility]  05/29/17

## 2017-05-30 LAB — RENAL FUNCTION PANEL
ALBUMIN: 4 g/dL (ref 3.5–4.8)
BUN/Creatinine Ratio: 18 (ref 10–24)
BUN: 17 mg/dL (ref 8–27)
CALCIUM: 9.5 mg/dL (ref 8.6–10.2)
CHLORIDE: 100 mmol/L (ref 96–106)
CO2: 26 mmol/L (ref 20–29)
Creatinine, Ser: 0.92 mg/dL (ref 0.76–1.27)
GFR calc non Af Amer: 84 mL/min/{1.73_m2} (ref >59)
GFR, EST AFRICAN AMERICAN: 97 mL/min/{1.73_m2} (ref >59)
GLUCOSE: 148 mg/dL — AB (ref 65–99)
PHOSPHORUS: 2.4 mg/dL — AB (ref 2.5–4.5)
POTASSIUM: 4.7 mmol/L (ref 3.5–5.2)
Sodium: 142 mmol/L (ref 134–144)

## 2017-05-30 LAB — PROTIME-INR
INR: 2.5 — ABNORMAL HIGH (ref 0.8–1.2)
Prothrombin Time: 24.2 s — ABNORMAL HIGH (ref 9.1–12.0)

## 2017-07-04 ENCOUNTER — Other Ambulatory Visit: Payer: Medicare Other

## 2017-07-04 DIAGNOSIS — Z7901 Long term (current) use of anticoagulants: Secondary | ICD-10-CM | POA: Diagnosis not present

## 2017-07-05 LAB — PROTIME-INR
INR: 1.8 — AB (ref 0.8–1.2)
PROTHROMBIN TIME: 18.5 s — AB (ref 9.1–12.0)

## 2017-07-24 ENCOUNTER — Other Ambulatory Visit: Payer: Medicare Other

## 2017-07-24 DIAGNOSIS — Z7901 Long term (current) use of anticoagulants: Secondary | ICD-10-CM | POA: Diagnosis not present

## 2017-07-25 LAB — PROTIME-INR
INR: 2 — ABNORMAL HIGH (ref 0.8–1.2)
Prothrombin Time: 20.3 s — ABNORMAL HIGH (ref 9.1–12.0)

## 2017-08-30 ENCOUNTER — Other Ambulatory Visit: Payer: Medicare Other

## 2017-08-30 DIAGNOSIS — Z7901 Long term (current) use of anticoagulants: Secondary | ICD-10-CM | POA: Diagnosis not present

## 2017-08-31 LAB — PROTIME-INR
INR: 1.6 — ABNORMAL HIGH (ref 0.8–1.2)
PROTHROMBIN TIME: 16.3 s — AB (ref 9.1–12.0)

## 2017-09-19 ENCOUNTER — Other Ambulatory Visit: Payer: Medicare Other

## 2017-09-19 DIAGNOSIS — Z7901 Long term (current) use of anticoagulants: Secondary | ICD-10-CM

## 2017-09-20 LAB — PROTIME-INR
INR: 2.8 — AB (ref 0.8–1.2)
PROTHROMBIN TIME: 27.1 s — AB (ref 9.1–12.0)

## 2017-10-25 ENCOUNTER — Other Ambulatory Visit: Payer: Medicare Other

## 2017-10-25 DIAGNOSIS — Z7901 Long term (current) use of anticoagulants: Secondary | ICD-10-CM | POA: Diagnosis not present

## 2017-10-26 LAB — PROTIME-INR
INR: 2.3 — ABNORMAL HIGH (ref 0.8–1.2)
Prothrombin Time: 23 s — ABNORMAL HIGH (ref 9.1–12.0)

## 2017-11-28 ENCOUNTER — Ambulatory Visit: Payer: BLUE CROSS/BLUE SHIELD

## 2017-11-28 ENCOUNTER — Other Ambulatory Visit: Payer: Self-pay | Admitting: Family Medicine

## 2017-11-28 DIAGNOSIS — I10 Essential (primary) hypertension: Secondary | ICD-10-CM

## 2017-11-29 ENCOUNTER — Ambulatory Visit (INDEPENDENT_AMBULATORY_CARE_PROVIDER_SITE_OTHER): Payer: Medicare Other | Admitting: Family Medicine

## 2017-11-29 ENCOUNTER — Encounter: Payer: Self-pay | Admitting: Family Medicine

## 2017-11-29 ENCOUNTER — Other Ambulatory Visit: Payer: Self-pay

## 2017-11-29 VITALS — BP 122/80 | HR 74 | Resp 16 | Ht 71.0 in | Wt 234.0 lb

## 2017-11-29 DIAGNOSIS — I10 Essential (primary) hypertension: Secondary | ICD-10-CM | POA: Diagnosis not present

## 2017-11-29 DIAGNOSIS — I679 Cerebrovascular disease, unspecified: Secondary | ICD-10-CM | POA: Insufficient documentation

## 2017-11-29 DIAGNOSIS — J301 Allergic rhinitis due to pollen: Secondary | ICD-10-CM | POA: Diagnosis not present

## 2017-11-29 DIAGNOSIS — Z7901 Long term (current) use of anticoagulants: Secondary | ICD-10-CM

## 2017-11-29 MED ORDER — WARFARIN SODIUM 3 MG PO TABS: 3.0000 mg | ORAL_TABLET | Freq: Every day | ORAL | 1 refills | Status: DC

## 2017-11-29 MED ORDER — FLUTICASONE PROPIONATE 50 MCG/ACT NA SUSP: 1.0000 | Freq: Every day | NASAL | 11 refills | Status: DC

## 2017-11-29 MED ORDER — ATENOLOL 25 MG PO TABS: 25.0000 mg | ORAL_TABLET | Freq: Every day | ORAL | 1 refills | Status: DC

## 2017-11-29 MED ORDER — WARFARIN SODIUM 1 MG PO TABS: 1.0000 mg | ORAL_TABLET | Freq: Every day | ORAL | 11 refills | Status: DC

## 2017-11-29 MED ORDER — LISINOPRIL 40 MG PO TABS: 40.0000 mg | ORAL_TABLET | Freq: Every day | ORAL | 1 refills | Status: DC

## 2017-11-29 NOTE — Progress Notes (Signed)
Name: [name]   MRN: [name]    DOB: March 05, 1946   Date:11/29/2017       Progress Note  Subjective  Chief Complaint  Chief Complaint  Patient presents with  . Hypertension    Follow Up   . Coagulation Disorder    INR labs needed per patient     Hypertension  This is a chronic problem. The current episode started more than 1 year ago. The problem has been waxing and waning since onset. The problem is controlled. Pertinent negatives include no anxiety, blurred vision, chest pain, headaches, malaise/fatigue, neck pain, orthopnea, palpitations, peripheral edema, PND, shortness of breath or sweats. There are no associated agents to hypertension. Risk factors for coronary artery disease include dyslipidemia, male gender and obesity. Past treatments include ACE inhibitors and beta blockers. The current treatment provides moderate improvement. There are no compliance problems.  There is no history of angina, kidney disease, CAD/MI, CVA, heart failure, left ventricular hypertrophy, PVD or retinopathy. There is no history of chronic renal disease, a hypertension causing med or renovascular disease.    No problem-specific Assessment & Plan notes found for this encounter.   Past Medical History:  Diagnosis Date  . Allergy   . Hypertension   . Stroke [facility])     Past Surgical History:  Procedure Laterality Date  . HERNIA REPAIR      History reviewed. No pertinent family history.  Social History   Socioeconomic History  . Marital status: Widowed    Spouse name: Not on file  . Number of children: Not on file  . Years of education: Not on file  . Highest education level: Not on file  Occupational History  . Not on file  Social Needs  . Financial resource strain: Not on file  . Food insecurity:    Worry: Not on file    Inability: Not on file  . Transportation needs:    Medical: Not on file    Non-medical: Not on file  Tobacco Use  . Smoking status: Never Smoker  .  Smokeless tobacco: Never Used  Substance and Sexual Activity  . Alcohol use: No    Alcohol/week: 0.0 oz  . Drug use: No  . Sexual activity: Not Currently  Lifestyle  . Physical activity:    Days per week: Not on file    Minutes per session: Not on file  . Stress: Not on file  Relationships  . Social connections:    Talks on phone: Not on file    Gets together: Not on file    Attends religious service: Not on file    Active member of club or organization: Not on file    Attends meetings of clubs or organizations: Not on file    Relationship status: Not on file  . Intimate partner violence:    Fear of current or ex partner: Not on file    Emotionally abused: Not on file    Physically abused: Not on file    Forced sexual activity: Not on file  Other Topics Concern  . Not on file  Social History Narrative  . Not on file    Allergies  Allergen Reactions  . Latex     Outpatient Medications Prior to Visit  Medication Sig Dispense Refill  . acetaminophen-codeine (TYLENOL #3) 300-30 MG tablet TAKE ONE TABLET BY MOUTH EVERY 4-6 HOURS AS NEEDED FOR PAIN  0  . atenolol (TENORMIN) 25 MG tablet TAKE 1 TABLET DAILY 90 tablet 0  .  fluticasone (FLONASE) 50 MCG/ACT nasal spray Place 1 spray into both nostrils daily. 16 g 11  . lisinopril (PRINIVIL,ZESTRIL) 40 MG tablet Take 1 tablet (40 mg total) by mouth daily. 90 tablet 1  . warfarin (COUMADIN) 1 MG tablet Take 1 mg by mouth daily. Takes 4 mg MWF 3mg  the rest of week    . warfarin (COUMADIN) 3 MG tablet Take 1 tablet (3 mg total) by mouth daily. 90 tablet 1   No facility-administered medications prior to visit.     Review of Systems  Constitutional: Negative for chills, fever, malaise/fatigue and weight loss.  HENT: Negative for ear discharge, ear pain and sore throat.   Eyes: Negative for blurred vision.  Respiratory: Negative for cough, sputum production, shortness of breath and wheezing.   Cardiovascular: Negative for chest  pain, palpitations, orthopnea, leg swelling and PND.  Gastrointestinal: Negative for abdominal pain, blood in stool, constipation, diarrhea, heartburn, melena and nausea.  Genitourinary: Negative for dysuria, frequency, hematuria and urgency.  Musculoskeletal: Negative for back pain, joint pain, myalgias and neck pain.  Skin: Negative for rash.  Neurological: Negative for dizziness, tingling, sensory change, focal weakness and headaches.  Endo/Heme/Allergies: Negative for environmental allergies and polydipsia. Does not bruise/bleed easily.  Psychiatric/Behavioral: Negative for depression and suicidal ideas. The patient is not nervous/anxious and does not have insomnia.      Objective  Vitals:   11/29/17 1003  BP: 122/80  Pulse: 74  Resp: 16  SpO2: 95%  Weight: 234 lb (106.1 kg)  Height: 5\' 11"  (1.803 m)    Physical Exam  Constitutional: He is oriented to person, place, and time.  HENT:  Head: Normocephalic.  Right Ear: External ear normal.  Left Ear: External ear normal.  Nose: Nose normal.  Mouth/Throat: Oropharynx is clear and moist.  Eyes: Pupils are equal, round, and reactive to light. Conjunctivae and EOM are normal. Right eye exhibits no discharge. Left eye exhibits no discharge. No scleral icterus.  Neck: Normal range of motion. Neck supple. No JVD present. No tracheal deviation present. No thyromegaly present.  Cardiovascular: Normal rate, regular rhythm, normal heart sounds and intact distal pulses. Exam reveals no gallop and no friction rub.  No murmur heard. Pulmonary/Chest: Breath sounds normal. No respiratory distress. He has no wheezes. He has no rales.  Abdominal: Soft. Bowel sounds are normal. He exhibits no mass. There is no hepatosplenomegaly. There is no tenderness. There is no rebound, no guarding and no CVA tenderness.  Musculoskeletal: Normal range of motion. He exhibits no edema or tenderness.  Lymphadenopathy:    He has no cervical adenopathy.   Neurological: He is alert and oriented to person, place, and time. He has normal strength and normal reflexes. No cranial nerve deficit.  Skin: Skin is warm. No rash noted.  Nursing note and vitals reviewed.     Assessment & Plan  Problem List Items Addressed This Visit      Cardiovascular and Mediastinum   Essential hypertension - Primary   Relevant Medications   atenolol (TENORMIN) 25 MG tablet   lisinopril (PRINIVIL,ZESTRIL) 40 MG tablet   warfarin (COUMADIN) 1 MG tablet   warfarin (COUMADIN) 3 MG tablet   Other Relevant Orders   Renal Function Panel   Cerebral vascular disease   Relevant Medications   atenolol (TENORMIN) 25 MG tablet   lisinopril (PRINIVIL,ZESTRIL) 40 MG tablet   warfarin (COUMADIN) 1 MG tablet   warfarin (COUMADIN) 3 MG tablet   Other Relevant Orders   INR/PT  Respiratory   Chronic seasonal allergic rhinitis due to pollen   Relevant Medications   fluticasone (FLONASE) 50 MCG/ACT nasal spray     Other   Anticoagulated on Coumadin   Relevant Medications   warfarin (COUMADIN) 1 MG tablet   warfarin (COUMADIN) 3 MG tablet   Other Relevant Orders   INR/PT      Meds ordered this encounter  Medications  . atenolol (TENORMIN) 25 MG tablet    Sig: Take 1 tablet (25 mg total) by mouth daily.    Dispense:  90 tablet    Refill:  1  . lisinopril (PRINIVIL,ZESTRIL) 40 MG tablet    Sig: Take 1 tablet (40 mg total) by mouth daily.    Dispense:  90 tablet    Refill:  1  . warfarin (COUMADIN) 1 MG tablet    Sig: Take 1 tablet (1 mg total) by mouth daily. Takes 4 mg MWF 3mg  the rest of week    Dispense:  30 tablet    Refill:  11  . fluticasone (FLONASE) 50 MCG/ACT nasal spray    Sig: Place 1 spray into both nostrils daily.    Dispense:  16 g    Refill:  11  . warfarin (COUMADIN) 3 MG tablet    Sig: Take 1 tablet (3 mg total) by mouth daily.    Dispense:  90 tablet    Refill:  1      Dr. Elizabeth Sauer [facility]  11/29/17

## 2017-11-30 LAB — PROTIME-INR
INR: 2.3 — AB (ref 0.8–1.2)
PROTHROMBIN TIME: 22.9 s — AB (ref 9.1–12.0)

## 2017-11-30 LAB — RENAL FUNCTION PANEL
ALBUMIN: 4 g/dL (ref 3.5–4.8)
BUN/Creatinine Ratio: 16 (ref 10–24)
BUN: 15 mg/dL (ref 8–27)
CALCIUM: 10.1 mg/dL (ref 8.6–10.2)
CHLORIDE: 100 mmol/L (ref 96–106)
CO2: 27 mmol/L (ref 20–29)
Creatinine, Ser: 0.94 mg/dL (ref 0.76–1.27)
GFR calc Af Amer: 95 mL/min/{1.73_m2} (ref >59)
GFR calc non Af Amer: 82 mL/min/{1.73_m2} (ref >59)
GLUCOSE: 161 mg/dL — AB (ref 65–99)
PHOSPHORUS: 2.6 mg/dL (ref 2.5–4.5)
POTASSIUM: 4.6 mmol/L (ref 3.5–5.2)
SODIUM: 142 mmol/L (ref 134–144)

## 2018-01-02 ENCOUNTER — Other Ambulatory Visit: Payer: Medicare Other

## 2018-01-02 DIAGNOSIS — Z7901 Long term (current) use of anticoagulants: Secondary | ICD-10-CM | POA: Diagnosis not present

## 2018-01-03 LAB — PROTIME-INR
INR: 2 — AB (ref 0.8–1.2)
Prothrombin Time: 20.3 s — ABNORMAL HIGH (ref 9.1–12.0)

## 2018-01-30 ENCOUNTER — Other Ambulatory Visit: Payer: Medicare Other

## 2018-01-30 DIAGNOSIS — Z7901 Long term (current) use of anticoagulants: Secondary | ICD-10-CM | POA: Diagnosis not present

## 2018-01-31 LAB — PROTIME-INR
INR: 2 — AB (ref 0.8–1.2)
Prothrombin Time: 20 s — ABNORMAL HIGH (ref 9.1–12.0)

## 2018-03-06 ENCOUNTER — Ambulatory Visit: Payer: Medicare Other

## 2018-03-06 DIAGNOSIS — Z7901 Long term (current) use of anticoagulants: Secondary | ICD-10-CM

## 2018-03-07 LAB — PROTIME-INR
INR: 2.2 — AB (ref 0.8–1.2)
PROTHROMBIN TIME: 21.7 s — AB (ref 9.1–12.0)

## 2018-04-03 ENCOUNTER — Other Ambulatory Visit: Payer: Medicare Other

## 2018-04-03 DIAGNOSIS — Z7901 Long term (current) use of anticoagulants: Secondary | ICD-10-CM | POA: Diagnosis not present

## 2018-04-04 LAB — PROTIME-INR
INR: 2.3 — AB (ref 0.8–1.2)
Prothrombin Time: 22.7 s — ABNORMAL HIGH (ref 9.1–12.0)

## 2018-05-01 ENCOUNTER — Ambulatory Visit: Payer: Medicare Other

## 2018-05-01 DIAGNOSIS — Z7901 Long term (current) use of anticoagulants: Secondary | ICD-10-CM

## 2018-05-02 LAB — PROTIME-INR
INR: 1.9 — ABNORMAL HIGH (ref 0.8–1.2)
PROTHROMBIN TIME: 19.2 s — AB (ref 9.1–12.0)

## 2018-05-27 ENCOUNTER — Ambulatory Visit (INDEPENDENT_AMBULATORY_CARE_PROVIDER_SITE_OTHER): Payer: Medicare Other | Admitting: Family Medicine

## 2018-05-27 ENCOUNTER — Encounter: Payer: Self-pay | Admitting: Family Medicine

## 2018-05-27 VITALS — BP 118/70 | HR 76 | Ht 71.0 in | Wt 229.0 lb

## 2018-05-27 DIAGNOSIS — I10 Essential (primary) hypertension: Secondary | ICD-10-CM

## 2018-05-27 DIAGNOSIS — Z23 Encounter for immunization: Secondary | ICD-10-CM

## 2018-05-27 DIAGNOSIS — J301 Allergic rhinitis due to pollen: Secondary | ICD-10-CM | POA: Diagnosis not present

## 2018-05-27 DIAGNOSIS — I679 Cerebrovascular disease, unspecified: Secondary | ICD-10-CM | POA: Diagnosis not present

## 2018-05-27 DIAGNOSIS — Z7901 Long term (current) use of anticoagulants: Secondary | ICD-10-CM | POA: Diagnosis not present

## 2018-05-27 MED ORDER — WARFARIN SODIUM 1 MG PO TABS: 1.0000 mg | ORAL_TABLET | Freq: Every day | ORAL | 11 refills | Status: DC

## 2018-05-27 MED ORDER — FLUTICASONE PROPIONATE 50 MCG/ACT NA SUSP: 1.0000 | Freq: Every day | NASAL | 11 refills | Status: DC

## 2018-05-27 MED ORDER — ATENOLOL 25 MG PO TABS: 25.0000 mg | ORAL_TABLET | Freq: Every day | ORAL | 1 refills | Status: DC

## 2018-05-27 MED ORDER — LISINOPRIL 40 MG PO TABS: 40.0000 mg | ORAL_TABLET | Freq: Every day | ORAL | 1 refills | Status: DC

## 2018-05-27 MED ORDER — WARFARIN SODIUM 3 MG PO TABS: 3.0000 mg | ORAL_TABLET | Freq: Every day | ORAL | 1 refills | Status: DC

## 2018-05-27 NOTE — Progress Notes (Signed)
Date:  05/27/2018   Name:  [name]   DOB:  Feb 22, 1946   MRN:  [id number]   Chief Complaint: Hypertension; Allergic Rhinitis ; and Anticoagulation (takes long term warfarin- both doses) Hypertension  This is a chronic problem. The current episode started more than 1 year ago. The problem is unchanged. The problem is controlled. Pertinent negatives include no anxiety, blurred vision, chest pain, headaches, malaise/fatigue, neck pain, orthopnea, palpitations, peripheral edema, PND, shortness of breath or sweats. There are no associated agents to hypertension. There are no known risk factors for coronary artery disease. Past treatments include ACE inhibitors and beta blockers. The current treatment provides moderate improvement. There are no compliance problems.  There is no history of angina, kidney disease, CAD/MI, CVA, heart failure, left ventricular hypertrophy, PVD or retinopathy. There is no history of chronic renal disease, a hypertension causing med or renovascular disease.  URI   This is a recurrent (for allergic rhintis) problem. The current episode started more than 1 year ago. The problem has been waxing and waning. There has been no fever. Pertinent negatives include no abdominal pain, chest pain, congestion, coughing, diarrhea, dysuria, ear pain, headaches, joint pain, joint swelling, nausea, neck pain, rash, rhinorrhea, sinus pain, sore throat or wheezing.  Neurologic Problem  The patient's pertinent negatives include no altered mental status, clumsiness, focal sensory loss, focal weakness, loss of balance, memory loss, near-syncope, slurred speech, syncope, visual change or weakness. This is a chronic problem. There was no focality noted. Pertinent negatives include no abdominal pain, auditory change, aura, back pain, bladder incontinence, bowel incontinence, chest pain, confusion, diaphoresis, dizziness, fatigue, fever, headaches, light-headedness, nausea, neck pain, palpitations,  shortness of breath or vertigo. Treatments tried: coumadin. The treatment provided mild relief. His past medical history is significant for a clotting disorder and a CVA. There is no history of a bleeding disorder, dementia, head trauma, liver disease, mood changes or seizures.     Review of Systems  Constitutional: Negative for chills, diaphoresis, fatigue, fever and malaise/fatigue.  HENT: Negative for congestion, drooling, ear discharge, ear pain, rhinorrhea, sinus pain and sore throat.   Eyes: Negative for blurred vision.  Respiratory: Negative for cough, shortness of breath and wheezing.   Cardiovascular: Negative for chest pain, palpitations, orthopnea, leg swelling, PND and near-syncope.  Gastrointestinal: Negative for abdominal pain, blood in stool, bowel incontinence, constipation, diarrhea and nausea.  Endocrine: Negative for polydipsia.  Genitourinary: Negative for bladder incontinence, dysuria, frequency, hematuria and urgency.  Musculoskeletal: Negative for back pain, joint pain, myalgias and neck pain.  Skin: Negative for rash.  Allergic/Immunologic: Negative for environmental allergies.  Neurological: Negative for dizziness, vertigo, focal weakness, syncope, weakness, light-headedness, headaches and loss of balance.  Hematological: Does not bruise/bleed easily.  Psychiatric/Behavioral: Negative for confusion, memory loss and suicidal ideas. The patient is not nervous/anxious.     Patient Active Problem List   Diagnosis Date Noted  . Essential hypertension 11/29/2017  . Chronic seasonal allergic rhinitis due to pollen 11/29/2017  . Cerebral vascular disease 11/29/2017  . Anticoagulated on Coumadin 02/23/2016    Allergies  Allergen Reactions  . Latex     Past Surgical History:  Procedure Laterality Date  . HERNIA REPAIR      Social History   Tobacco Use  . Smoking status: Never Smoker  . Smokeless tobacco: Never Used  Substance Use Topics  . Alcohol use: No      Alcohol/week: 0.0 standard drinks  . Drug use: No  Medication list has been reviewed and updated.  Current Meds  Medication Sig  . atenolol (TENORMIN) 25 MG tablet Take 1 tablet (25 mg total) by mouth daily.  . fluticasone (FLONASE) 50 MCG/ACT nasal spray Place 1 spray into both nostrils daily.  Marland Kitchen lisinopril (PRINIVIL,ZESTRIL) 40 MG tablet Take 1 tablet (40 mg total) by mouth daily.  Marland Kitchen warfarin (COUMADIN) 1 MG tablet Take 1 tablet (1 mg total) by mouth daily. Takes 4 mg MWF 3mg  the rest of week  . warfarin (COUMADIN) 3 MG tablet Take 1 tablet (3 mg total) by mouth daily.  . [DISCONTINUED] atenolol (TENORMIN) 25 MG tablet Take 1 tablet (25 mg total) by mouth daily.  . [DISCONTINUED] fluticasone (FLONASE) 50 MCG/ACT nasal spray Place 1 spray into both nostrils daily.  . [DISCONTINUED] lisinopril (PRINIVIL,ZESTRIL) 40 MG tablet Take 1 tablet (40 mg total) by mouth daily.  . [DISCONTINUED] warfarin (COUMADIN) 1 MG tablet Take 1 tablet (1 mg total) by mouth daily. Takes 4 mg MWF 3mg  the rest of week  . [DISCONTINUED] warfarin (COUMADIN) 3 MG tablet Take 1 tablet (3 mg total) by mouth daily.    PHQ 2/9 Scores 05/27/2018 05/29/2017 11/30/2016 11/30/2015  PHQ - 2 Score 0 0 0 0  PHQ- 9 Score - 0 - -    Physical Exam  Constitutional: He is oriented to person, place, and time.  HENT:  Head: Normocephalic.  Right Ear: External ear normal.  Left Ear: External ear normal.  Nose: Nose normal.  Mouth/Throat: Oropharynx is clear and moist.  Eyes: Pupils are equal, round, and reactive to light. Conjunctivae and EOM are normal. Right eye exhibits no discharge. Left eye exhibits no discharge. No scleral icterus.  Neck: Normal range of motion. Neck supple. No JVD present. No tracheal deviation present. No thyromegaly present.  Cardiovascular: Normal rate, regular rhythm, normal heart sounds and intact distal pulses. Exam reveals no gallop and no friction rub.  No murmur heard. Pulmonary/Chest:  Breath sounds normal. No respiratory distress. He has no wheezes. He has no rales.  Abdominal: Soft. Bowel sounds are normal. He exhibits no mass. There is no hepatosplenomegaly. There is no tenderness. There is no rebound, no guarding and no CVA tenderness.  Musculoskeletal: Normal range of motion. He exhibits no edema or tenderness.  Lymphadenopathy:    He has no cervical adenopathy.  Neurological: He is alert and oriented to person, place, and time. He has normal strength and normal reflexes. No cranial nerve deficit.  Skin: Skin is warm. No rash noted.  Nursing note and vitals reviewed.   BP 118/70   Pulse 76   Ht 5\' 11"  (1.803 m)   Wt 229 lb (103.9 kg)   BMI 31.94 kg/m   Assessment and Plan:  1. Cerebral vascular disease Stable on med- refill Warfarin/ draw INR - warfarin (COUMADIN) 3 MG tablet; Take 1 tablet (3 mg total) by mouth daily.  Dispense: 90 tablet; Refill: 1 - warfarin (COUMADIN) 1 MG tablet; Take 1 tablet (1 mg total) by mouth daily. Takes 4 mg MWF 3mg  the rest of week  Dispense: 30 tablet; Refill: 11  2. Essential hypertension Stable on meds- refill Lisinopril and Atenolol/ draw renal panel - lisinopril (PRINIVIL,ZESTRIL) 40 MG tablet; Take 1 tablet (40 mg total) by mouth daily.  Dispense: 90 tablet; Refill: 1 - atenolol (TENORMIN) 25 MG tablet; Take 1 tablet (25 mg total) by mouth daily.  Dispense: 90 tablet; Refill: 1 - Renal Function Panel  3. Chronic seasonal allergic rhinitis  due to pollen Refill nasal spray- stable on meds - fluticasone (FLONASE) 50 MCG/ACT nasal spray; Place 1 spray into both nostrils daily.  Dispense: 16 g; Refill: 11  4. Anticoagulated on Coumadin Stable on meds- refill Warfarin 3mg  and 1mg / draw INR - warfarin (COUMADIN) 3 MG tablet; Take 1 tablet (3 mg total) by mouth daily.  Dispense: 90 tablet; Refill: 1 - warfarin (COUMADIN) 1 MG tablet; Take 1 tablet (1 mg total) by mouth daily. Takes 4 mg MWF 3mg  the rest of week  Dispense: 30  tablet; Refill: 11 - Protime-INR  5. Need for pneumococcal vaccination administered - Pneumococcal conjugate vaccine 13-valent   Dr. Elizabeth Sauer [facility]  05/27/2018

## 2018-05-28 LAB — RENAL FUNCTION PANEL
ALBUMIN: 4 g/dL (ref 3.5–4.8)
BUN/Creatinine Ratio: 16 (ref 10–24)
BUN: 16 mg/dL (ref 8–27)
CALCIUM: 9.7 mg/dL (ref 8.6–10.2)
CHLORIDE: 99 mmol/L (ref 96–106)
CO2: 27 mmol/L (ref 20–29)
Creatinine, Ser: 0.97 mg/dL (ref 0.76–1.27)
GFR calc non Af Amer: 78 mL/min/{1.73_m2} (ref >59)
GFR, EST AFRICAN AMERICAN: 90 mL/min/{1.73_m2} (ref >59)
GLUCOSE: 152 mg/dL — AB (ref 65–99)
PHOSPHORUS: 2.8 mg/dL (ref 2.5–4.5)
POTASSIUM: 4.3 mmol/L (ref 3.5–5.2)
Sodium: 141 mmol/L (ref 134–144)

## 2018-05-28 LAB — PROTIME-INR
INR: 1.9 — AB (ref 0.8–1.2)
PROTHROMBIN TIME: 18.8 s — AB (ref 9.1–12.0)

## 2018-06-19 ENCOUNTER — Other Ambulatory Visit: Payer: Medicare Other

## 2018-06-19 DIAGNOSIS — Z7901 Long term (current) use of anticoagulants: Secondary | ICD-10-CM

## 2018-06-20 LAB — PROTIME-INR
INR: 2.4 — ABNORMAL HIGH (ref 0.8–1.2)
PROTHROMBIN TIME: 23.6 s — AB (ref 9.1–12.0)

## 2018-07-24 ENCOUNTER — Other Ambulatory Visit: Payer: Medicare Other

## 2018-07-24 DIAGNOSIS — Z7901 Long term (current) use of anticoagulants: Secondary | ICD-10-CM | POA: Diagnosis not present

## 2018-07-25 LAB — PROTIME-INR
INR: 2 — ABNORMAL HIGH (ref 0.8–1.2)
Prothrombin Time: 19.4 s — ABNORMAL HIGH (ref 9.1–12.0)

## 2018-08-28 ENCOUNTER — Other Ambulatory Visit: Payer: Medicare Other

## 2018-08-28 DIAGNOSIS — Z7901 Long term (current) use of anticoagulants: Secondary | ICD-10-CM | POA: Diagnosis not present

## 2018-08-29 LAB — PROTIME-INR
INR: 2.5 — AB (ref 0.8–1.2)
Prothrombin Time: 24 s — ABNORMAL HIGH (ref 9.1–12.0)

## 2018-10-02 ENCOUNTER — Other Ambulatory Visit: Payer: Medicare Other

## 2018-10-02 DIAGNOSIS — Z7901 Long term (current) use of anticoagulants: Secondary | ICD-10-CM

## 2018-10-03 LAB — PROTIME-INR
INR: 2.2 — ABNORMAL HIGH (ref 0.8–1.2)
Prothrombin Time: 21.3 s — ABNORMAL HIGH (ref 9.1–12.0)

## 2018-10-14 ENCOUNTER — Other Ambulatory Visit: Payer: Self-pay | Admitting: Family Medicine

## 2018-10-14 DIAGNOSIS — Z7901 Long term (current) use of anticoagulants: Secondary | ICD-10-CM

## 2018-10-14 DIAGNOSIS — I679 Cerebrovascular disease, unspecified: Secondary | ICD-10-CM

## 2019-01-07 ENCOUNTER — Other Ambulatory Visit: Payer: Self-pay | Admitting: Family Medicine

## 2019-01-07 DIAGNOSIS — I10 Essential (primary) hypertension: Secondary | ICD-10-CM

## 2019-01-16 ENCOUNTER — Other Ambulatory Visit: Payer: Self-pay | Admitting: Family Medicine

## 2019-01-16 DIAGNOSIS — I679 Cerebrovascular disease, unspecified: Secondary | ICD-10-CM

## 2019-01-16 DIAGNOSIS — Z7901 Long term (current) use of anticoagulants: Secondary | ICD-10-CM

## 2019-01-19 ENCOUNTER — Other Ambulatory Visit: Payer: Self-pay | Admitting: Family Medicine

## 2019-01-19 DIAGNOSIS — I679 Cerebrovascular disease, unspecified: Secondary | ICD-10-CM

## 2019-01-19 DIAGNOSIS — Z7901 Long term (current) use of anticoagulants: Secondary | ICD-10-CM

## 2019-01-30 ENCOUNTER — Other Ambulatory Visit: Payer: Self-pay | Admitting: Family Medicine

## 2019-01-30 DIAGNOSIS — I10 Essential (primary) hypertension: Secondary | ICD-10-CM

## 2019-04-07 ENCOUNTER — Other Ambulatory Visit: Payer: Self-pay | Admitting: Family Medicine

## 2019-04-07 DIAGNOSIS — I10 Essential (primary) hypertension: Secondary | ICD-10-CM

## 2019-04-10 ENCOUNTER — Encounter: Payer: Self-pay | Admitting: Family Medicine

## 2019-04-10 ENCOUNTER — Other Ambulatory Visit: Payer: Self-pay

## 2019-04-10 ENCOUNTER — Ambulatory Visit (INDEPENDENT_AMBULATORY_CARE_PROVIDER_SITE_OTHER): Payer: Medicare Other | Admitting: Family Medicine

## 2019-04-10 VITALS — BP 128/72 | HR 78 | Ht 71.0 in | Wt 241.0 lb

## 2019-04-10 DIAGNOSIS — I679 Cerebrovascular disease, unspecified: Secondary | ICD-10-CM | POA: Diagnosis not present

## 2019-04-10 DIAGNOSIS — I10 Essential (primary) hypertension: Secondary | ICD-10-CM | POA: Diagnosis not present

## 2019-04-10 DIAGNOSIS — Z7901 Long term (current) use of anticoagulants: Secondary | ICD-10-CM | POA: Diagnosis not present

## 2019-04-10 MED ORDER — WARFARIN SODIUM 3 MG PO TABS: 3.0000 mg | ORAL_TABLET | Freq: Every day | ORAL | 1 refills | Status: DC

## 2019-04-10 MED ORDER — LISINOPRIL 40 MG PO TABS: 40.0000 mg | ORAL_TABLET | Freq: Every day | ORAL | 1 refills | Status: DC

## 2019-04-10 MED ORDER — WARFARIN SODIUM 4 MG PO TABS: ORAL_TABLET | ORAL | 1 refills | Status: DC

## 2019-04-10 MED ORDER — ATENOLOL 25 MG PO TABS: 25.0000 mg | ORAL_TABLET | Freq: Every day | ORAL | 1 refills | Status: DC

## 2019-04-10 NOTE — Progress Notes (Signed)
Date:  04/10/2019   Name:  [name]   DOB:  1945/12/09   MRN:  [id number]   Chief Complaint: Hypertension and cerebral vascular disease  Hypertension This is a chronic problem. The current episode started more than 1 year ago. The problem has been gradually improving since onset. The problem is controlled. Pertinent negatives include no anxiety, blurred vision, chest pain, headaches, malaise/fatigue, neck pain, orthopnea, palpitations, peripheral edema, PND, shortness of breath or sweats. There are no associated agents to hypertension. There are no known risk factors for coronary artery disease. Past treatments include beta blockers and ACE inhibitors. The current treatment provides moderate improvement. There are no compliance problems.  There is no history of angina, kidney disease, CAD/MI, CVA, heart failure, left ventricular hypertrophy, PVD or retinopathy. There is no history of chronic renal disease, a hypertension causing med or renovascular disease.  Neurologic Problem The patient's primary symptoms include weakness. The patient's pertinent negatives include no altered mental status, clumsiness, focal sensory loss, focal weakness, loss of balance, memory loss, near-syncope, slurred speech, syncope or visual change. Primary symptoms comment: right sided. This is a chronic problem. The current episode started more than 1 year ago. The problem is unchanged. There was right-sided, lower extremity and upper extremity focality noted. Pertinent negatives include no abdominal pain, back pain, bladder incontinence, bowel incontinence, chest pain, dizziness, fever, headaches, nausea, neck pain, palpitations or shortness of breath.    Review of Systems  Constitutional: Negative for chills, fever and malaise/fatigue.  HENT: Negative for drooling, ear discharge, ear pain and sore throat.   Eyes: Negative for blurred vision.  Respiratory: Negative for cough, shortness of breath and wheezing.    Cardiovascular: Negative for chest pain, palpitations, orthopnea, leg swelling, PND and near-syncope.  Gastrointestinal: Negative for abdominal pain, blood in stool, bowel incontinence, constipation, diarrhea and nausea.  Endocrine: Negative for polydipsia.  Genitourinary: Negative for bladder incontinence, dysuria, frequency, hematuria and urgency.  Musculoskeletal: Negative for back pain, myalgias and neck pain.  Skin: Negative for rash.  Allergic/Immunologic: Negative for environmental allergies.  Neurological: Positive for weakness. Negative for dizziness, focal weakness, syncope, headaches and loss of balance.  Hematological: Does not bruise/bleed easily.  Psychiatric/Behavioral: Negative for memory loss and suicidal ideas. The patient is not nervous/anxious.     Patient Active Problem List   Diagnosis Date Noted  . Essential hypertension 11/29/2017  . Chronic seasonal allergic rhinitis due to pollen 11/29/2017  . Cerebral vascular disease 11/29/2017  . Anticoagulated on Coumadin 02/23/2016    Allergies  Allergen Reactions  . Latex     Past Surgical History:  Procedure Laterality Date  . HERNIA REPAIR      Social History   Tobacco Use  . Smoking status: Never Smoker  . Smokeless tobacco: Never Used  Substance Use Topics  . Alcohol use: No    Alcohol/week: 0.0 standard drinks  . Drug use: No     Medication list has been reviewed and updated.  Current Meds  Medication Sig  . atenolol (TENORMIN) 25 MG tablet TAKE 1 TABLET DAILY  . fluticasone (FLONASE) 50 MCG/ACT nasal spray Place 1 spray into both nostrils daily.  Marland Kitchen lisinopril (ZESTRIL) 40 MG tablet TAKE 1 TABLET DAILY  . warfarin (COUMADIN) 1 MG tablet Take 4 mg by mouth. 1mg  Monday, Wednesday, and Fridays and 3mg  the rest of the week.   . warfarin (COUMADIN) 3 MG tablet Take 3 mg by mouth daily.    PHQ 2/9 Scores 04/10/2019 05/27/2018  05/29/2017 11/30/2016  PHQ - 2 Score 0 0 0 0  PHQ- 9 Score - - 0 -    BP  Readings from Last 3 Encounters:  04/10/19 128/72  05/27/18 118/70  11/29/17 122/80    Physical Exam Vitals signs and nursing note reviewed.  HENT:     Head: Normocephalic.     Right Ear: Tympanic membrane, ear canal and external ear normal.     Left Ear: Tympanic membrane, ear canal and external ear normal.     Nose: Nose normal. No congestion or rhinorrhea.     Mouth/Throat:     Mouth: Mucous membranes are dry.  Eyes:     General: No scleral icterus.       Right eye: No discharge.        Left eye: No discharge.     Conjunctiva/sclera: Conjunctivae normal.     Pupils: Pupils are equal, round, and reactive to light.  Neck:     Musculoskeletal: Normal range of motion and neck supple.     Thyroid: No thyromegaly.     Vascular: No JVD.     Trachea: No tracheal deviation.  Cardiovascular:     Rate and Rhythm: Normal rate and regular rhythm.     Heart sounds: Normal heart sounds. No murmur. No friction rub. No gallop.   Pulmonary:     Effort: No respiratory distress.     Breath sounds: Normal breath sounds. No wheezing or rales.  Abdominal:     General: Bowel sounds are normal.     Palpations: Abdomen is soft. There is no mass.     Tenderness: There is no abdominal tenderness. There is no guarding or rebound.  Musculoskeletal: Normal range of motion.        General: No tenderness.  Lymphadenopathy:     Cervical: No cervical adenopathy.  Skin:    General: Skin is warm.     Capillary Refill: Capillary refill takes less than 2 seconds.     Findings: No rash.  Neurological:     Mental Status: He is alert and oriented to person, place, and time.     Cranial Nerves: No cranial nerve deficit.     Deep Tendon Reflexes: Reflexes are normal and symmetric.     Wt Readings from Last 3 Encounters:  04/10/19 241 lb (109.3 kg)  05/27/18 229 lb (103.9 kg)  11/29/17 234 lb (106.1 kg)    BP 128/72   Pulse 78   Ht 5\' 11"  (1.803 m)   Wt 241 lb (109.3 kg)   SpO2 95%   BMI 33.61  kg/m   Assessment and Plan:  1. Essential hypertension Chronic.  Controlled.  Continue atenolol 25 mg once a day and lisinopril 40 mg once a day.  Review of patient's renal function is within normal limits and will repeat in 6 months. - atenolol (TENORMIN) 25 MG tablet; Take 1 tablet (25 mg total) by mouth daily.  Dispense: 90 tablet; Refill: 1 - lisinopril (ZESTRIL) 40 MG tablet; Take 1 tablet (40 mg total) by mouth daily.  Dispense: 90 tablet; Refill: 1 - Renal Function Panel  2. Cerebral vascular disease Currently stable on Coumadin on an directed dose depending on INR.  Patient will continue to control dietary cholesterol intake and attempt to lose weight. - warfarin (COUMADIN) 3 MG tablet; Take 1 tablet (3 mg total) by mouth daily.  Dispense: 90 tablet; Refill: 1 - warfarin (COUMADIN) 4 MG tablet; TAKE 1 TABLET AS DIRECTED (TAKE 4 MG  ON MONDAY, WEDNESDAY, AND FRIDAY AND 3 MG THE REST OF THE WEEK)  Dispense: 30 tablet; Refill: 1  3. Anticoagulated on Coumadin Patient on anticoagulation with Coumadin due to previous history of CVA with minimal residual.  Patient was given a handicap sticker application because of residual right-sided weakness.  Will continue any anticoagulation with Coumadin. - warfarin (COUMADIN) 3 MG tablet; Take 1 tablet (3 mg total) by mouth daily.  Dispense: 90 tablet; Refill: 1 - warfarin (COUMADIN) 4 MG tablet; TAKE 1 TABLET AS DIRECTED (TAKE 4 MG ON MONDAY, WEDNESDAY, AND FRIDAY AND 3 MG THE REST OF THE WEEK)  Dispense: 30 tablet; Refill: 1 - INR/PT

## 2019-04-11 LAB — PROTIME-INR
INR: 2 — ABNORMAL HIGH (ref 0.8–1.2)
Prothrombin Time: 20.9 s — ABNORMAL HIGH (ref 9.1–12.0)

## 2019-04-11 LAB — RENAL FUNCTION PANEL
Albumin: 3.8 g/dL (ref 3.7–4.7)
BUN/Creatinine Ratio: 14 (ref 10–24)
BUN: 14 mg/dL (ref 8–27)
CO2: 24 mmol/L (ref 20–29)
Calcium: 9.5 mg/dL (ref 8.6–10.2)
Chloride: 99 mmol/L (ref 96–106)
Creatinine, Ser: 1.02 mg/dL (ref 0.76–1.27)
GFR calc Af Amer: 84 mL/min/{1.73_m2} (ref >59)
GFR calc non Af Amer: 73 mL/min/{1.73_m2} (ref >59)
Glucose: 248 mg/dL — ABNORMAL HIGH (ref 65–99)
Phosphorus: 2.2 mg/dL — ABNORMAL LOW (ref 2.8–4.1)
Potassium: 4.4 mmol/L (ref 3.5–5.2)
Sodium: 137 mmol/L (ref 134–144)

## 2019-07-19 ENCOUNTER — Other Ambulatory Visit: Payer: Self-pay | Admitting: Family Medicine

## 2019-07-19 DIAGNOSIS — J301 Allergic rhinitis due to pollen: Secondary | ICD-10-CM

## 2019-09-17 ENCOUNTER — Other Ambulatory Visit: Payer: Self-pay | Admitting: Family Medicine

## 2019-10-07 ENCOUNTER — Other Ambulatory Visit: Payer: Self-pay | Admitting: Family Medicine

## 2019-10-07 DIAGNOSIS — I10 Essential (primary) hypertension: Secondary | ICD-10-CM

## 2019-10-10 ENCOUNTER — Encounter: Payer: Self-pay | Admitting: Family Medicine

## 2019-10-10 ENCOUNTER — Other Ambulatory Visit: Payer: Self-pay

## 2019-10-10 ENCOUNTER — Ambulatory Visit (INDEPENDENT_AMBULATORY_CARE_PROVIDER_SITE_OTHER): Payer: Medicare Other | Admitting: Family Medicine

## 2019-10-10 VITALS — BP 124/68 | HR 68 | Ht 71.0 in | Wt 228.0 lb

## 2019-10-10 DIAGNOSIS — J301 Allergic rhinitis due to pollen: Secondary | ICD-10-CM

## 2019-10-10 DIAGNOSIS — Z6831 Body mass index (BMI) 31.0-31.9, adult: Secondary | ICD-10-CM

## 2019-10-10 DIAGNOSIS — I10 Essential (primary) hypertension: Secondary | ICD-10-CM | POA: Diagnosis not present

## 2019-10-10 DIAGNOSIS — I679 Cerebrovascular disease, unspecified: Secondary | ICD-10-CM

## 2019-10-10 DIAGNOSIS — Z7901 Long term (current) use of anticoagulants: Secondary | ICD-10-CM | POA: Diagnosis not present

## 2019-10-10 DIAGNOSIS — R739 Hyperglycemia, unspecified: Secondary | ICD-10-CM

## 2019-10-10 DIAGNOSIS — E669 Obesity, unspecified: Secondary | ICD-10-CM | POA: Diagnosis not present

## 2019-10-10 MED ORDER — ATENOLOL 25 MG PO TABS: 25.0000 mg | ORAL_TABLET | Freq: Every day | ORAL | 1 refills | Status: DC

## 2019-10-10 MED ORDER — LISINOPRIL 40 MG PO TABS: 40.0000 mg | ORAL_TABLET | Freq: Every day | ORAL | 1 refills | Status: DC

## 2019-10-10 NOTE — Progress Notes (Signed)
Date:  10/10/2019   Name:  [name]   DOB:  April 06, 1946   MRN:  [id number]   Chief Complaint: Allergic Rhinitis , Hypertension, cerebral vascular disease (takes coumadin as prescribed), and Hyperglycemia  Hypertension This is a chronic problem. The current episode started more than 1 year ago. The problem has been gradually improving since onset. The problem is controlled. Pertinent negatives include no anxiety, blurred vision, chest pain, headaches, malaise/fatigue, neck pain, orthopnea, palpitations, peripheral edema, PND, shortness of breath or sweats. There are no associated agents to hypertension. There are no known risk factors for coronary artery disease. Past treatments include lifestyle changes. The current treatment provides moderate improvement. There are no compliance problems.  Hypertensive end-organ damage includes CVA. There is no history of angina, kidney disease, CAD/MI, heart failure, left ventricular hypertrophy, PVD or retinopathy. There is no history of chronic renal disease, a hypertension causing med or renovascular disease.  Hyperglycemia This is a recurrent problem. The current episode started more than 1 year ago. The problem has been waxing and waning. Pertinent negatives include no abdominal pain, anorexia, arthralgias, change in bowel habit, chest pain, chills, congestion, coughing, diaphoresis, fatigue, fever, headaches, joint swelling, myalgias, nausea, neck pain, numbness, rash, sore throat, swollen glands, urinary symptoms, vertigo, visual change, vomiting or weakness. Nothing aggravates the symptoms. He has tried nothing for the symptoms.  Neurologic Problem The patient's pertinent negatives include no altered mental status, clumsiness, focal sensory loss, focal weakness, loss of balance, memory loss, near-syncope, slurred speech, syncope, visual change or weakness. This is a chronic problem. The current episode started more than 1 year ago. The neurological  problem developed gradually. The problem has been gradually improving since onset. Pertinent negatives include no abdominal pain, back pain, chest pain, diaphoresis, dizziness, fatigue, fever, headaches, nausea, neck pain, palpitations, shortness of breath, vertigo or vomiting.    Lab Results  Component Value Date   CREATININE 1.02 04/10/2019   BUN 14 04/10/2019   NA 137 04/10/2019   K 4.4 04/10/2019   CL 99 04/10/2019   CO2 24 04/10/2019   Lab Results  Component Value Date   CHOL 159 11/30/2016   HDL 40 11/30/2016   LDLCALC 88 11/30/2016   TRIG 154 (H) 11/30/2016   CHOLHDL 4.0 11/30/2016   No results found for: TSH No results found for: HGBA1C   Review of Systems  Constitutional: Negative for chills, diaphoresis, fatigue, fever and malaise/fatigue.  HENT: Negative for congestion, drooling, ear discharge, ear pain, nosebleeds, postnasal drip and sore throat.   Eyes: Negative for blurred vision.  Respiratory: Negative for cough, chest tightness, shortness of breath and wheezing.   Cardiovascular: Negative for chest pain, palpitations, orthopnea, leg swelling, PND and near-syncope.  Gastrointestinal: Negative for abdominal pain, anorexia, blood in stool, change in bowel habit, constipation, diarrhea, nausea and vomiting.  Endocrine: Negative for polydipsia.  Genitourinary: Negative for dysuria, frequency, hematuria and urgency.  Musculoskeletal: Negative for arthralgias, back pain, joint swelling, myalgias and neck pain.  Skin: Negative for rash.  Allergic/Immunologic: Negative for environmental allergies.  Neurological: Negative for dizziness, vertigo, focal weakness, syncope, weakness, numbness, headaches and loss of balance.  Hematological: Does not bruise/bleed easily.  Psychiatric/Behavioral: Negative for memory loss and suicidal ideas. The patient is not nervous/anxious.     Patient Active Problem List   Diagnosis Date Noted  . Essential hypertension 11/29/2017  .  Chronic seasonal allergic rhinitis due to pollen 11/29/2017  . Cerebral vascular disease 11/29/2017  . Anticoagulated on  Coumadin 02/23/2016    Allergies  Allergen Reactions  . Latex     Past Surgical History:  Procedure Laterality Date  . HERNIA REPAIR      Social History   Tobacco Use  . Smoking status: Never Smoker  . Smokeless tobacco: Never Used  Substance Use Topics  . Alcohol use: No    Alcohol/week: 0.0 standard drinks  . Drug use: No     Medication list has been reviewed and updated.  Current Meds  Medication Sig  . atenolol (TENORMIN) 25 MG tablet TAKE 1 TABLET DAILY  . fluticasone (FLONASE) 50 MCG/ACT nasal spray USE 1 SPRAY IN EACH NOSTRIL DAILY  . lisinopril (ZESTRIL) 40 MG tablet Take 1 tablet (40 mg total) by mouth daily.  Marland Kitchen warfarin (COUMADIN) 1 MG tablet TAKE 1 TABLET AS DIRECTED (TAKE 4 MG ON MONDAY, WEDNESDAY, AND FRIDAY AND 3 MG THE REST OF THE WEEK)  . warfarin (COUMADIN) 3 MG tablet Take 1 tablet (3 mg total) by mouth daily.  Marland Kitchen warfarin (COUMADIN) 4 MG tablet TAKE 1 TABLET AS DIRECTED (TAKE 4 MG ON MONDAY, WEDNESDAY, AND FRIDAY AND 3 MG THE REST OF THE WEEK)    PHQ 2/9 Scores 10/10/2019 04/10/2019 05/27/2018 05/29/2017  PHQ - 2 Score 0 0 0 0  PHQ- 9 Score 0 - - 0    BP Readings from Last 3 Encounters:  10/10/19 124/68  04/10/19 128/72  05/27/18 118/70    Physical Exam Vitals and nursing note reviewed.  HENT:     Head: Normocephalic.     Right Ear: Tympanic membrane, ear canal and external ear normal. There is no impacted cerumen.     Left Ear: Tympanic membrane, ear canal and external ear normal. There is no impacted cerumen.     Nose: Nose normal. No congestion or rhinorrhea.  Eyes:     General: No scleral icterus.       Right eye: No discharge.        Left eye: No discharge.     Conjunctiva/sclera: Conjunctivae normal.     Pupils: Pupils are equal, round, and reactive to light.  Neck:     Thyroid: No thyromegaly.     Vascular: No  JVD.     Trachea: No tracheal deviation.  Cardiovascular:     Rate and Rhythm: Normal rate and regular rhythm.     Heart sounds: Normal heart sounds. No murmur. No friction rub. No gallop.   Pulmonary:     Effort: No respiratory distress.     Breath sounds: Normal breath sounds. No wheezing, rhonchi or rales.  Abdominal:     General: Bowel sounds are normal.     Palpations: Abdomen is soft. There is no mass.     Tenderness: There is no abdominal tenderness. There is no right CVA tenderness, left CVA tenderness, guarding or rebound.  Musculoskeletal:        General: No tenderness. Normal range of motion.     Cervical back: Normal range of motion and neck supple.  Lymphadenopathy:     Cervical: No cervical adenopathy.  Skin:    General: Skin is warm.     Findings: No rash.  Neurological:     Mental Status: He is alert and oriented to person, place, and time.     Cranial Nerves: No cranial nerve deficit.     Deep Tendon Reflexes: Reflexes are normal and symmetric.     Wt Readings from Last 3 Encounters:  10/10/19 228 lb (103.4  kg)  04/10/19 241 lb (109.3 kg)  05/27/18 229 lb (103.9 kg)    BP 124/68   Pulse 68   Ht 5\' 11"  (1.803 m)   Wt 228 lb (103.4 kg)   BMI 31.80 kg/m   Assessment and Plan:  1. Essential hypertension Chronic.  Controlled.  Stable.  Continue atenolol 25 mg once a day and lisinopril 40 mg once a day.  Will check renal function panel to assess GFR. - atenolol (TENORMIN) 25 MG tablet; Take 1 tablet (25 mg total) by mouth daily.  Dispense: 90 tablet; Refill: 1 - lisinopril (ZESTRIL) 40 MG tablet; Take 1 tablet (40 mg total) by mouth daily.  Dispense: 90 tablet; Refill: 1  2. Hyperglycemia Chronic.  Controlled in the past.  Stable.  Currently exercising and dieting.  Will evaluate with A1c, renal function panel and lipid panel. - Hemoglobin A1c - Renal Function Panel - Lipid panel  3. Cerebral vascular disease .  Controlled.  Stable.  No progression of  disease.  We'll continue to anticoagulate on Coumadin. - INR/PT  4. Anticoagulated on Coumadin Patient is tolerating Coumadin has not had changes although has been reluctant to come in because of Covid for several months.  We will check an INR PT today. - INR/PT  5. Chronic seasonal allergic rhinitis due to pollen Chronic.  Episodic.  Seasonal.  Continue Flonase 1 puff each nostril daily.  6. Class 1 obesity with serious comorbidity and body mass index (BMI) of 31.0 to 31.9 in adult, unspecified obesity type Health risks of being over weight were discussed and patient was counseled on weight loss options and exercise.  Patient's will continue with his swimming and using discretion on his dietary intake.  Will check lipid panel. - Lipid panel

## 2019-10-10 NOTE — Patient Instructions (Signed)
Carbohydrate Counting for Diabetes Mellitus, Adult  Carbohydrate counting is a method of keeping track of how many carbohydrates you eat. Eating carbohydrates naturally increases the amount of sugar (glucose) in the blood. Counting how many carbohydrates you eat helps keep your blood glucose within normal limits, which helps you manage your diabetes (diabetes mellitus). It is important to know how many carbohydrates you can safely have in each meal. This is different for every person. A diet and nutrition specialist (registered dietitian) can help you make a meal plan and calculate how many carbohydrates you should have at each meal and snack. Carbohydrates are found in the following foods:  Grains, such as breads and cereals.  Dried beans and soy products.  Starchy vegetables, such as potatoes, peas, and corn.  Fruit and fruit juices.  Milk and yogurt.  Sweets and snack foods, such as cake, cookies, candy, chips, and soft drinks. How do I count carbohydrates? There are two ways to count carbohydrates in food. You can use either of the methods or a combination of both. Reading "Nutrition Facts" on packaged food The "Nutrition Facts" list is included on the labels of almost all packaged foods and beverages in the [place] It includes:  The serving size.  Information about nutrients in each serving, including the grams (g) of carbohydrate per serving. To use the "Nutrition Facts":  Decide how many servings you will have.  Multiply the number of servings by the number of carbohydrates per serving.  The resulting number is the total amount of carbohydrates that you will be having. Learning standard serving sizes of other foods When you eat carbohydrate foods that are not packaged or do not include "Nutrition Facts" on the label, you need to measure the servings in order to count the amount of carbohydrates:  Measure the foods that you will eat with a food scale or measuring cup, if  needed.  Decide how many standard-size servings you will eat.  Multiply the number of servings by 15. Most carbohydrate-rich foods have about 15 g of carbohydrates per serving. ? For example, if you eat 8 oz (170 g) of strawberries, you will have eaten 2 servings and 30 g of carbohydrates (2 servings x 15 g = 30 g).  For foods that have more than one food mixed, such as soups and casseroles, you must count the carbohydrates in each food that is included. The following list contains standard serving sizes of common carbohydrate-rich foods. Each of these servings has about 15 g of carbohydrates:   hamburger bun or  English muffin.   oz (15 mL) syrup.   oz (14 g) jelly.  1 slice of bread.  1 six-inch tortilla.  3 oz (85 g) cooked rice or pasta.  4 oz (113 g) cooked dried beans.  4 oz (113 g) starchy vegetable, such as peas, corn, or potatoes.  4 oz (113 g) hot cereal.  4 oz (113 g) mashed potatoes or  of a large baked potato.  4 oz (113 g) canned or frozen fruit.  4 oz (120 mL) fruit juice.  4-6 crackers.  6 chicken nuggets.  6 oz (170 g) unsweetened dry cereal.  6 oz (170 g) plain fat-free yogurt or yogurt sweetened with artificial sweeteners.  8 oz (240 mL) milk.  8 oz (170 g) fresh fruit or one small piece of fruit.  24 oz (680 g) popped popcorn. Example of carbohydrate counting Sample meal  3 oz (85 g) chicken breast.  6 oz (170 g)   brown rice.  4 oz (113 g) corn.  8 oz (240 mL) milk.  8 oz (170 g) strawberries with sugar-free whipped topping. Carbohydrate calculation 1. Identify the foods that contain carbohydrates: ? Rice. ? Corn. ? Milk. ? Strawberries. 2. Calculate how many servings you have of each food: ? 2 servings rice. ? 1 serving corn. ? 1 serving milk. ? 1 serving strawberries. 3. Multiply each number of servings by 15 g: ? 2 servings rice x 15 g = 30 g. ? 1 serving corn x 15 g = 15 g. ? 1 serving milk x 15 g = 15 g. ? 1  serving strawberries x 15 g = 15 g. 4. Add together all of the amounts to find the total grams of carbohydrates eaten: ? 30 g + 15 g + 15 g + 15 g = 75 g of carbohydrates total. Summary  Carbohydrate counting is a method of keeping track of how many carbohydrates you eat.  Eating carbohydrates naturally increases the amount of sugar (glucose) in the blood.  Counting how many carbohydrates you eat helps keep your blood glucose within normal limits, which helps you manage your diabetes.  A diet and nutrition specialist (registered dietitian) can help you make a meal plan and calculate how many carbohydrates you should have at each meal and snack. This information is not intended to replace advice given to you by your health care provider. Make sure you discuss any questions you have with your health care provider. Document Revised: 02/15/2017 Document Reviewed: 01/05/2016 Elsevier Patient Education  2020 [employer]  

## 2019-10-11 LAB — RENAL FUNCTION PANEL
Albumin: 3.9 g/dL (ref 3.7–4.7)
BUN/Creatinine Ratio: 13 (ref 10–24)
BUN: 13 mg/dL (ref 8–27)
CO2: 27 mmol/L (ref 20–29)
Calcium: 10.1 mg/dL (ref 8.6–10.2)
Chloride: 101 mmol/L (ref 96–106)
Creatinine, Ser: 1.04 mg/dL (ref 0.76–1.27)
GFR calc Af Amer: 83 mL/min/{1.73_m2} (ref >59)
GFR calc non Af Amer: 71 mL/min/{1.73_m2} (ref >59)
Glucose: 182 mg/dL — ABNORMAL HIGH (ref 65–99)
Phosphorus: 2.9 mg/dL (ref 2.8–4.1)
Potassium: 4.5 mmol/L (ref 3.5–5.2)
Sodium: 144 mmol/L (ref 134–144)

## 2019-10-11 LAB — LIPID PANEL
Chol/HDL Ratio: 4 ratio (ref 0.0–5.0)
Cholesterol, Total: 173 mg/dL (ref 100–199)
HDL: 43 mg/dL (ref >39)
LDL Chol Calc (NIH): 103 mg/dL — ABNORMAL HIGH (ref 0–99)
Triglycerides: 154 mg/dL — ABNORMAL HIGH (ref 0–149)
VLDL Cholesterol Cal: 27 mg/dL (ref 5–40)

## 2019-10-11 LAB — PROTIME-INR
INR: 1.7 — ABNORMAL HIGH (ref 0.9–1.2)
Prothrombin Time: 17.7 s — ABNORMAL HIGH (ref 9.1–12.0)

## 2019-10-11 LAB — HEMOGLOBIN A1C
Est. average glucose Bld gHb Est-mCnc: 220 mg/dL
Hgb A1c MFr Bld: 9.3 % — ABNORMAL HIGH (ref 4.8–5.6)

## 2019-10-13 ENCOUNTER — Other Ambulatory Visit: Payer: Self-pay

## 2019-10-13 DIAGNOSIS — E119 Type 2 diabetes mellitus without complications: Secondary | ICD-10-CM

## 2019-10-13 MED ORDER — METFORMIN HCL 500 MG PO TABS: 500.0000 mg | ORAL_TABLET | Freq: Two times a day (BID) | ORAL | 0 refills | Status: DC

## 2019-10-15 ENCOUNTER — Ambulatory Visit: Payer: Medicare Other | Admitting: Family Medicine

## 2019-10-28 ENCOUNTER — Ambulatory Visit (INDEPENDENT_AMBULATORY_CARE_PROVIDER_SITE_OTHER): Payer: Medicare Other | Admitting: Family Medicine

## 2019-10-28 ENCOUNTER — Encounter: Payer: Self-pay | Admitting: Family Medicine

## 2019-10-28 ENCOUNTER — Other Ambulatory Visit: Payer: Self-pay

## 2019-10-28 VITALS — BP 110/60 | HR 80 | Ht 71.0 in | Wt 222.0 lb

## 2019-10-28 DIAGNOSIS — E119 Type 2 diabetes mellitus without complications: Secondary | ICD-10-CM | POA: Diagnosis not present

## 2019-10-28 DIAGNOSIS — E782 Mixed hyperlipidemia: Secondary | ICD-10-CM

## 2019-10-28 NOTE — Progress Notes (Signed)
Date:  10/28/2019   Name:  [name]   DOB:  12-10-1945   MRN:  [id number]   Chief Complaint: Diabetes (follow up starting metformin- only taking at supper)  Diabetes He presents for his follow-up diabetic visit. He has type 2 diabetes mellitus. His disease course has been stable. There are no hypoglycemic associated symptoms. Pertinent negatives for hypoglycemia include no dizziness, headaches or nervousness/anxiousness. Pertinent negatives for diabetes include no blurred vision, no chest pain, no fatigue, no foot paresthesias, no foot ulcerations, no polydipsia, no polyphagia, no polyuria, no visual change, no weakness and no weight loss. There are no hypoglycemic complications. Symptoms are stable. There are no diabetic complications. Current diabetic treatment includes oral agent (monotherapy) (metformrn 500 mg daily). His weight is fluctuating minimally. He is following a generally healthy diet. Meal planning includes avoidance of concentrated sweets and carbohydrate counting. He participates in exercise daily. His breakfast blood glucose is taken between 7-8 am. His breakfast blood glucose range is generally 90-110 mg/dl. His dinner blood glucose is taken between 5-6 pm. His dinner blood glucose range is generally 130-140 mg/dl. An ACE inhibitor/angiotensin II receptor blocker is being taken.  Hyperlipidemia This is a chronic problem. The current episode started more than 1 year ago. The problem is uncontrolled. Recent lipid tests were reviewed and are normal. Pertinent negatives include no chest pain, focal sensory loss, focal weakness, leg pain, myalgias or shortness of breath. Current antihyperlipidemic treatment includes diet change. The current treatment provides moderate improvement of lipids. There are no compliance problems.  Risk factors for coronary artery disease include male sex, obesity, dyslipidemia and diabetes mellitus.    Lab Results  Component Value Date   CREATININE  1.04 10/10/2019   BUN 13 10/10/2019   NA 144 10/10/2019   K 4.5 10/10/2019   CL 101 10/10/2019   CO2 27 10/10/2019   Lab Results  Component Value Date   CHOL 173 10/10/2019   HDL 43 10/10/2019   LDLCALC 103 (H) 10/10/2019   TRIG 154 (H) 10/10/2019   CHOLHDL 4.0 10/10/2019   No results found for: TSH Lab Results  Component Value Date   HGBA1C 9.3 (H) 10/10/2019   No results found for: WBC, HGB, HCT, MCV, PLT No results found for: ALT, AST, GGT, ALKPHOS, BILITOT   Review of Systems  Constitutional: Negative for chills, fatigue, fever and weight loss.  HENT: Negative for drooling, ear discharge, ear pain and sore throat.   Eyes: Negative for blurred vision.  Respiratory: Negative for cough, shortness of breath and wheezing.   Cardiovascular: Negative for chest pain, palpitations and leg swelling.  Gastrointestinal: Negative for abdominal pain, blood in stool, constipation, diarrhea and nausea.  Endocrine: Negative for polydipsia, polyphagia and polyuria.  Genitourinary: Negative for dysuria, frequency, hematuria and urgency.  Musculoskeletal: Negative for back pain, myalgias and neck pain.  Skin: Negative for rash.  Allergic/Immunologic: Negative for environmental allergies.  Neurological: Negative for dizziness, focal weakness, weakness and headaches.  Hematological: Does not bruise/bleed easily.  Psychiatric/Behavioral: Negative for suicidal ideas. The patient is not nervous/anxious.     Patient Active Problem List   Diagnosis Date Noted  . Essential hypertension 11/29/2017  . Chronic seasonal allergic rhinitis due to pollen 11/29/2017  . Cerebral vascular disease 11/29/2017  . Anticoagulated on Coumadin 02/23/2016    Allergies  Allergen Reactions  . Latex     Past Surgical History:  Procedure Laterality Date  . HERNIA REPAIR      Social History  Tobacco Use  . Smoking status: Never Smoker  . Smokeless tobacco: Never Used  Substance Use Topics  .  Alcohol use: No    Alcohol/week: 0.0 standard drinks  . Drug use: No     Medication list has been reviewed and updated.  Current Meds  Medication Sig  . atenolol (TENORMIN) 25 MG tablet Take 1 tablet (25 mg total) by mouth daily.  . fluticasone (FLONASE) 50 MCG/ACT nasal spray USE 1 SPRAY IN EACH NOSTRIL DAILY  . lisinopril (ZESTRIL) 40 MG tablet Take 1 tablet (40 mg total) by mouth daily.  . metFORMIN (GLUCOPHAGE) 500 MG tablet Take 1 tablet (500 mg total) by mouth 2 (two) times daily with a meal. (Patient taking differently: Take 500 mg by mouth daily. )  . warfarin (COUMADIN) 1 MG tablet TAKE 1 TABLET AS DIRECTED (TAKE 4 MG ON MONDAY, WEDNESDAY, AND FRIDAY AND 3 MG THE REST OF THE WEEK)  . warfarin (COUMADIN) 3 MG tablet Take 1 tablet (3 mg total) by mouth daily.  Marland Kitchen warfarin (COUMADIN) 4 MG tablet TAKE 1 TABLET AS DIRECTED (TAKE 4 MG ON MONDAY, WEDNESDAY, AND FRIDAY AND 3 MG THE REST OF THE WEEK)    PHQ 2/9 Scores 10/28/2019 10/10/2019 04/10/2019 05/27/2018  PHQ - 2 Score 0 0 0 0  PHQ- 9 Score 0 0 - -    BP Readings from Last 3 Encounters:  10/28/19 110/60  10/10/19 124/68  04/10/19 128/72    Physical Exam Vitals and nursing note reviewed.  HENT:     Head: Normocephalic.     Right Ear: External ear normal.     Left Ear: External ear normal.     Nose: Nose normal.  Eyes:     General: No scleral icterus.       Right eye: No discharge.        Left eye: No discharge.     Conjunctiva/sclera: Conjunctivae normal.     Pupils: Pupils are equal, round, and reactive to light.  Neck:     Thyroid: No thyromegaly.     Vascular: No JVD.     Trachea: No tracheal deviation.  Cardiovascular:     Rate and Rhythm: Normal rate and regular rhythm.     Heart sounds: Normal heart sounds, S1 normal and S2 normal. No murmur. No systolic murmur. No diastolic murmur. No friction rub. No gallop. No S3 or S4 sounds.   Pulmonary:     Effort: No respiratory distress.     Breath sounds: Normal  breath sounds. No wheezing or rales.  Abdominal:     General: Bowel sounds are normal.     Palpations: Abdomen is soft. There is no mass.     Tenderness: There is no abdominal tenderness. There is no guarding or rebound.  Musculoskeletal:        General: No tenderness. Normal range of motion.     Cervical back: Normal range of motion and neck supple.  Lymphadenopathy:     Cervical: No cervical adenopathy.  Skin:    General: Skin is warm.     Findings: No rash.  Neurological:     Mental Status: He is alert and oriented to person, place, and time.     Cranial Nerves: No cranial nerve deficit.     Deep Tendon Reflexes: Reflexes are normal and symmetric.     Wt Readings from Last 3 Encounters:  10/28/19 222 lb (100.7 kg)  10/10/19 228 lb (103.4 kg)  04/10/19 241 lb (109.3 kg)  BP 110/60   Pulse 80   Ht 5\' 11"  (1.803 m)   Wt 222 lb (100.7 kg)   BMI 30.96 kg/m   Assessment and Plan: 1. Diabetes mellitus, new onset (HCC) New onset.  Improving.  Stable by fasting blood sugars and blood sugars during the day.  Fasting blood sugars have been under 120 and 2-hour PC's have been under 140 mg percent.  Is currently taking Metformin 500 mg once a day and is tolerating medication well.  Patient is also been dieting although he was not aware of certain foods are high in sugar such as watermelon patient has been given guidelines as to what he should be eating and avoiding.  We will continue on the current regimen of dietary changes and exercising and Metformin and will recheck in 6 weeks at which time an A1c and lipid panel will be done.  2. 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  We will recheck lipid panel in 6 weeks as well and patient has been given dietary approach to decreasing triglycerides.

## 2019-10-28 NOTE — Patient Instructions (Signed)
Diabetes Mellitus and Nutrition, Adult When you have diabetes (diabetes mellitus), it is very important to have healthy eating habits because your blood sugar (glucose) levels are greatly affected by what you eat and drink. Eating healthy foods in the appropriate amounts, at about the same times every day, can help you:  Control your blood glucose.  Lower your risk of heart disease.  Improve your blood pressure.  Reach or maintain a healthy weight. Every person with diabetes is different, and each person has different needs for a meal plan. Your health care provider may recommend that you work with a diet and nutrition specialist (dietitian) to make a meal plan that is best for you. Your meal plan may vary depending on factors such as:  The calories you need.  The medicines you take.  Your weight.  Your blood glucose, blood pressure, and cholesterol levels.  Your activity level.  Other health conditions you have, such as heart or kidney disease. How do carbohydrates affect me? Carbohydrates, also called carbs, affect your blood glucose level more than any other type of food. Eating carbs naturally raises the amount of glucose in your blood. Carb counting is a method for keeping track of how many carbs you eat. Counting carbs is important to keep your blood glucose at a healthy level, especially if you use insulin or take certain oral diabetes medicines. It is important to know how many carbs you can safely have in each meal. This is different for every person. Your dietitian can help you calculate how many carbs you should have at each meal and for each snack. Foods that contain carbs include:  Bread, cereal, rice, pasta, and crackers.  Potatoes and corn.  Peas, beans, and lentils.  Milk and yogurt.  Fruit and juice.  Desserts, such as cakes, cookies, ice cream, and candy. How does alcohol affect me? Alcohol can cause a sudden decrease in blood glucose (hypoglycemia),  especially if you use insulin or take certain oral diabetes medicines. Hypoglycemia can be a life-threatening condition. Symptoms of hypoglycemia (sleepiness, dizziness, and confusion) are similar to symptoms of having too much alcohol. If your health care provider says that alcohol is safe for you, follow these guidelines:  Limit alcohol intake to no more than 1 drink per day for nonpregnant women and 2 drinks per day for men. One drink equals 12 oz of beer, 5 oz of wine, or 1 oz of hard liquor.  Do not drink on an empty stomach.  Keep yourself hydrated with water, diet soda, or unsweetened iced tea.  Keep in mind that regular soda, juice, and other mixers may contain a lot of sugar and must be counted as carbs. What are tips for following this plan?  Reading food labels  Start by checking the serving size on the "Nutrition Facts" label of packaged foods and drinks. The amount of calories, carbs, fats, and other nutrients listed on the label is based on one serving of the item. Many items contain more than one serving per package.  Check the total grams (g) of carbs in one serving. You can calculate the number of servings of carbs in one serving by dividing the total carbs by 15. For example, if a food has 30 g of total carbs, it would be equal to 2 servings of carbs.  Check the number of grams (g) of saturated and trans fats in one serving. Choose foods that have low or no amount of these fats.  Check the number of   milligrams (mg) of salt (sodium) in one serving. Most people should limit total sodium intake to less than 2,300 mg per day.  Always check the nutrition information of foods labeled as "low-fat" or "nonfat". These foods may be higher in added sugar or refined carbs and should be avoided.  Talk to your dietitian to identify your daily goals for nutrients listed on the label. Shopping  Avoid buying canned, premade, or processed foods. These foods tend to be high in fat, sodium,  and added sugar.  Shop around the outside edge of the grocery store. This includes fresh fruits and vegetables, bulk grains, fresh meats, and fresh dairy. Cooking  Use low-heat cooking methods, such as baking, instead of high-heat cooking methods like deep frying.  Cook using healthy oils, such as olive, canola, or sunflower oil.  Avoid cooking with butter, cream, or high-fat meats. Meal planning  Eat meals and snacks regularly, preferably at the same times every day. Avoid going long periods of time without eating.  Eat foods high in fiber, such as fresh fruits, vegetables, beans, and whole grains. Talk to your dietitian about how many servings of carbs you can eat at each meal.  Eat 4-6 ounces (oz) of lean protein each day, such as lean meat, chicken, fish, eggs, or tofu. One oz of lean protein is equal to: ? 1 oz of meat, chicken, or fish. ? 1 egg. ?  cup of tofu.  Eat some foods each day that contain healthy fats, such as avocado, nuts, seeds, and fish. Lifestyle  Check your blood glucose regularly.  Exercise regularly as told by your health care provider. This may include: ? 150 minutes of moderate-intensity or vigorous-intensity exercise each week. This could be brisk walking, biking, or water aerobics. ? Stretching and doing strength exercises, such as yoga or weightlifting, at least 2 times a week.  Take medicines as told by your health care provider.  Do not use any products that contain nicotine or tobacco, such as cigarettes and e-cigarettes. If you need help quitting, ask your health care provider.  Work with a Veterinary surgeon or diabetes educator to identify strategies to manage stress and any emotional and social challenges. Questions to ask a health care provider  Do I need to meet with a diabetes educator?  Do I need to meet with a dietitian?  What number can I call if I have questions?  When are the best times to check my blood glucose? Where to find more  information:  American Diabetes Association: diabetes.org  Academy of Nutrition and Dietetics: www.eatright.[employer] of Diabetes and Digestive and Kidney Diseases (NIH): CarFlippers.tn Summary  A healthy meal plan will help you control your blood glucose and maintain a healthy lifestyle.  Working with a diet and nutrition specialist (dietitian) can help you make a meal plan that is best for you.  Keep in mind that carbohydrates (carbs) and alcohol have immediate effects on your blood glucose levels. It is important to count carbs and to use alcohol carefully. This information is not intended to replace advice given to you by your health care provider. Make sure you discuss any questions you have with your health care provider. Document Revised: 07/06/2017 Document Reviewed: 08/28/2016 Elsevier Patient Education  2020 Elsevier Inc. GUIDELINES FOR  LOW-CHOLESTEROL, LOW-TRIGLYCERIDE DIETS    FOODS TO USE   MEATS, FISH Choose lean meats (chicken, [place], veal, and non-fatty cuts of beef with excess fat trimmed; one serving = 3 oz of cooked meat).  Also, fresh or frozen fish, canned fish packed in water, and shellfish (lobster, crabs, shrimp, and oysters). Limit use to no more than one serving of one of these per week. Shellfish are high in cholesterol but low in saturated fat and should be used sparingly. Meats and fish should be broiled (pan or oven) or baked on a rack.  EGGS Egg substitutes and egg whites (use freely). Egg yolks (limit two per week).  FRUITS Eat three servings of fresh fruit per day (1 serving =  cup). Be sure to have at least one citrus fruit daily. Frozen and canned fruit with no sugar or syrup added may be used.  VEGETABLES Most vegetables are not limited (see next page). One dark-green (string beans, escarole) or one deep yellow (squash) vegetable is recommended daily. Cauliflower, broccoli, and celery, as well as potato skins, are recommended for their  fiber content. (Fiber is associated with cholesterol reduction) It is preferable to steam vegetables, but they may be boiled, strained, or braised with polyunsaturated vegetable oil (see below).  BEANS Dried peas or beans (1 serving =  cup) may be used as a bread substitute.  NUTS Almonds, walnuts, and peanuts may be used sparingly  (1 serving = 1 Tablespoonful). Use pumpkin, sesame, or sunflower seeds.  BREADS, GRAINS One roll or one slice of whole grain or enriched bread may be used, or three soda crackers or four pieces of melba toast as a substitute. Spaghetti, rice or noodles ( cup) or  large ear of corn may be used as a bread substitute. In preparing these foods do not use butter or shortening, use soft margarine. Also use egg and sugar substitutes.  Choose high fiber grains, such as oats and whole wheat.  CEREALS Use  cup of hot cereal or  cup of cold cereal per day. Add a sugar substitute if desired, with 99% fat free or skim milk.  MILK PRODUCTS Always use 99% fat free or skim milk, dairy products such as low fat cheeses (farmer's uncreamed diet cottage), low-fat yogurt, and powdered skim milk.  FATS, OILS Use soft (not stick) margarine; vegetable oils that are high in polyunsaturated fats (such as safflower, sunflower, soybean, corn, and cottonseed). Always refrigerate meat drippings to harden the fat and remove it before preparing gravies  DESSERTS, SNACKS Limit to two servings per day; substitute each serving for a bread/cereal serving: ice milk, water sherbet (1/4 cup); unflavored gelatin or gelatin flavored with sugar substitute (1/3 cup); pudding prepared with skim milk (1/2 cup); egg white souffls; unbuttered popcorn (1  cups). Substitute carob for chocolate.  BEVERAGES Fresh fruit juices (limit 4 oz per day); black coffee, plain or herbal teas; soft drinks with sugar substitutes; club soda, preferably salt-free; cocoa made with skim milk or nonfat dried milk and water (sugar  substitute added if desired); clear broth. Alcohol: limit two servings per day (see second page).  MISCELLANEOUS  You may use the following freely: vinegar, spices, herbs, nonfat bouillon, mustard, Worcestershire sauce, soy sauce, flavoring essence.                  GUIDELINES FOR  LOW-CHOLESTEROL, LOW TRIGLYCERIDE DIETS    FOODS TO AVOID   MEATS, FISH Marbled beef, pork, bacon, sausage, and other pork products; fatty fowl (duck, goose); skin and fat of [place] and chicken; processed meats; luncheon meats (salami, bologna); frankfurters and fast-food hamburgers (theyre loaded with fat); organ meats (kidneys, liver); canned fish packed in oil.  EGGS Limit egg yolks  to two per week.   FRUITS Coconuts (rich in saturated fats).  VEGETABLES Avoid avocados. Starchy vegetables (potatoes, corn, lima beans, dried peas, beans) may be used only if substitutes for a serving of bread or cereal. (Baked potato skin, however, is desirable for its fiber content.  BEANS Commercial baked beans with sugar and/or pork added.  NUTS Avoid nuts.  Limit peanuts and walnuts to one tablespoonful per day.  BREADS, GRAINS Any baked goods with shortening and/or sugar. Commercial mixes with dried eggs and whole milk. Avoid sweet rolls, doughnuts, breakfast pastries (Danish), and sweetened packaged cereals (the added sugar converts readily to triglycerides).  MILK PRODUCTS Whole milk and whole-milk packaged goods; cream; ice cream; whole-milk puddings, yogurt, or cheeses; nondairy cream substitutes.  FATS, OILS Butter, lard, animal fats, bacon drippings, gravies, cream sauces as well as palm and coconut oils. All these are high in saturated fats. Examine labels on cholesterol free products for hydrogenated fats. (These are oils that have been hardened into solids and in the process have become saturated.)  DESSERTS, SNACKS Fried snack foods like potato chips; chocolate; candies in general; jams, jellies, syrups;  whole- milk puddings; ice cream and milk sherbets; hydrogenated peanut butter.  BEVERAGES Sugared fruit juices and soft drinks; cocoa made with whole milk and/or sugar. When using alcohol (1 oz liquor, 5 oz beer, or 2  oz dry table wine per serving), one serving must be substituted for one bread or cereal serving (limit, two servings of alcohol per day).   SPECIAL NOTES    5. Remember that even non-limited foods should be used in moderation. 6. While on a cholesterol-lowering diet, be sure to avoid animal fats and marbled meats. 7. 3. While on a triglyceride-lowering diet, be sure to avoid sweets and to control the amount of carbohydrates you eat (starchy foods such as flour, bread, potatoes).While on a tri-glyceride-lowering diet, be sure to avoid sweets 8. Buy a good low-fat cookbook, such as the one published by the American Heart Association. 9. Consult your physician if you have any questions.               [facility] Low Glycemic Diet Plan   Low Glycemic Foods (20-49) Moderate Glycemic Foods (50-69) High Glycemic Foods (70-100)      Breakfast Creals Breakfast Cereals Breakfast Cereals  All Bran All-Bran Fruit'n Oats   Bran Buds Bran Chex   Cheerios Corn chex    Fiber One Oatmeal (not instant)   Just Right Mini-Wheats   Corn Flakes Cream of Wheat    Oat Bran Special K Swiss Muesli   Grape Nuts Grape Nut Flakes      Grits Nutri-Grain    Fruits and fruit juice: Fruits Puffed Rice Puffed Wheat    (Limit to 1-2 Servings per day) Banana (under-ride) Dates   Rice Chex Rice Krispies    Apples Apricots (fresh/dried)   Figs Grapes   Shredded Wheat Team    Blackberries Blueberries   Kiwi Mango   Total     Cherries Cranberries   Oranges Raisins     Peaches Pears    Fruits  Plums Prunes   Fruit Juices Pineapple Watermelon    Grapefruit Raspberries   Cranberry Juice Orange Juice   Banana (over-ripe)     Strawberries Tangerines      Apple Juice  Grapefruit Juice   Beans and Legumes Beverages  Tomato Juice    [place]-type baked beans Sodas, sweet tea, pineapple juice   Canned pinto,  kidney, or navy beans   Beans and Legumes (fresh-cooked) Green peas Vegetables  Black-eyed peas Butter Beans    Potato, baked, boiled, fried, mashed  Chick peas Lentils   Vegetables [place] fries  Green beans Lima beans   Beets Carrots   Canned or frozen corn  Kidney beans Navy beans   Sweet potato Yam   Parsnips  Pinto beans Snow peas   Corn on the cob Winter squash      Non-starchy vegetables Grains Breads  Asparagus, avocado, broccoli, cabbage Cornmeal Rice, brown   Most breads (white and whole grain)  cauliflower, celery, cucumber, greens Rice, white Couscous   Bagels Bread sticks    lettuce, mushrooms, peppers, tomatoes  Bread stuffing Kaiser roll    okra, onions, spinach, summer squash Pasta Dinner rolls   [employer], cheese     Grains Ravioli, meat filled Spaghetti, white   Grains  Barley Bulgur    Rice, instant Tapioca, with milk    Rye Wild rice   Nuts    Cashews Macadamia   Candy and most cookies  Nuts and oils    Almonds, peanuts, sunflower seeds Snacks Snacks  hazelnuts, pecans, walnuts Chocolate Ice cream, lowfat   Donuts Corn chips    Oils that are liquid at room temperature Muffin Popcorn   Jelly beans Pretzels      Pastries  Dairy, fish, meat, soy, and eggs    Milk, skim Lowfat cheese    Restaurant and ethnic foods  Yogurt, lowfat, fruit sugar sweetened  Most [place] food (sugar in stir fry    or wok sauce)  Lean red meat Fish    Teriyaki-style meats and vegetables  Skinless chicken and [place], shellfish        Egg whites (up to 3 daily), Soy Products    Egg yolks (up to 7 or _____ per week)

## 2019-11-09 ENCOUNTER — Other Ambulatory Visit: Payer: Self-pay | Admitting: Family Medicine

## 2019-11-09 DIAGNOSIS — E119 Type 2 diabetes mellitus without complications: Secondary | ICD-10-CM

## 2019-11-16 ENCOUNTER — Other Ambulatory Visit: Payer: Self-pay | Admitting: Family Medicine

## 2019-11-16 DIAGNOSIS — J301 Allergic rhinitis due to pollen: Secondary | ICD-10-CM

## 2019-12-02 DIAGNOSIS — Z23 Encounter for immunization: Secondary | ICD-10-CM | POA: Diagnosis not present

## 2019-12-10 ENCOUNTER — Other Ambulatory Visit: Payer: Self-pay

## 2019-12-10 ENCOUNTER — Encounter: Payer: Self-pay | Admitting: Family Medicine

## 2019-12-10 ENCOUNTER — Ambulatory Visit (INDEPENDENT_AMBULATORY_CARE_PROVIDER_SITE_OTHER): Payer: Medicare Other | Admitting: Family Medicine

## 2019-12-10 VITALS — BP 118/52 | HR 64 | Ht 71.0 in | Wt 213.0 lb

## 2019-12-10 DIAGNOSIS — E119 Type 2 diabetes mellitus without complications: Secondary | ICD-10-CM | POA: Diagnosis not present

## 2019-12-10 NOTE — Progress Notes (Signed)
Date:  12/10/2019   Name:  [name]   DOB:  09/08/1945   MRN:  [id number]   Chief Complaint: Follow-up (A1C 9.3- pt went on diet and is not snacking between meals, not taking metformin)  Diabetes He presents for his follow-up diabetic visit. He has type 2 diabetes mellitus. His disease course has been stable. There are no hypoglycemic associated symptoms. Pertinent negatives for hypoglycemia include no dizziness, headaches or nervousness/anxiousness. There are no diabetic associated symptoms. Pertinent negatives for diabetes include no blurred vision, no chest pain, no fatigue, no foot paresthesias, no foot ulcerations, no polydipsia, no polyphagia, no polyuria, no visual change, no weakness and no weight loss. There are no hypoglycemic complications. Symptoms are worsening. There are no diabetic complications. Risk factors for coronary artery disease include diabetes mellitus, hypertension, dyslipidemia and male sex. Current diabetic treatment includes diet. His weight is stable. He is following a generally healthy (doing better) diet. Meal planning includes avoidance of concentrated sweets and carbohydrate counting. He participates in exercise daily. His breakfast blood glucose is taken between 7-8 am. His breakfast blood glucose range is generally 110-130 mg/dl.    Lab Results  Component Value Date   CREATININE 1.04 10/10/2019   BUN 13 10/10/2019   NA 144 10/10/2019   K 4.5 10/10/2019   CL 101 10/10/2019   CO2 27 10/10/2019   Lab Results  Component Value Date   CHOL 173 10/10/2019   HDL 43 10/10/2019   LDLCALC 103 (H) 10/10/2019   TRIG 154 (H) 10/10/2019   CHOLHDL 4.0 10/10/2019   No results found for: TSH Lab Results  Component Value Date   HGBA1C 9.3 (H) 10/10/2019   No results found for: WBC, HGB, HCT, MCV, PLT No results found for: ALT, AST, GGT, ALKPHOS, BILITOT   Review of Systems  Constitutional: Negative for chills, fatigue, fever and weight loss.  HENT:  Negative for drooling, ear discharge, ear pain, postnasal drip, rhinorrhea and sore throat.   Eyes: Negative for blurred vision.  Respiratory: Negative for cough, shortness of breath and wheezing.   Cardiovascular: Negative for chest pain, palpitations and leg swelling.  Gastrointestinal: Negative for abdominal pain, blood in stool, constipation, diarrhea and nausea.  Endocrine: Negative for polydipsia, polyphagia and polyuria.  Genitourinary: Negative for dysuria, frequency, hematuria and urgency.  Musculoskeletal: Negative for back pain, myalgias and neck pain.  Skin: Negative for rash.  Allergic/Immunologic: Negative for environmental allergies.  Neurological: Negative for dizziness, weakness and headaches.  Hematological: Does not bruise/bleed easily.  Psychiatric/Behavioral: Negative for suicidal ideas. The patient is not nervous/anxious.     Patient Active Problem List   Diagnosis Date Noted  . Essential hypertension 11/29/2017  . Chronic seasonal allergic rhinitis due to pollen 11/29/2017  . Cerebral vascular disease 11/29/2017  . Anticoagulated on Coumadin 02/23/2016    Allergies  Allergen Reactions  . Latex     Past Surgical History:  Procedure Laterality Date  . HERNIA REPAIR      Social History   Tobacco Use  . Smoking status: Never Smoker  . Smokeless tobacco: Never Used  Substance Use Topics  . Alcohol use: No    Alcohol/week: 0.0 standard drinks  . Drug use: No     Medication list has been reviewed and updated.  Current Meds  Medication Sig  . atenolol (TENORMIN) 25 MG tablet Take 1 tablet (25 mg total) by mouth daily.  . fluticasone (FLONASE) 50 MCG/ACT nasal spray USE 1 SPRAY IN EACH NOSTRIL  DAILY  . lisinopril (ZESTRIL) 40 MG tablet Take 1 tablet (40 mg total) by mouth daily.  Marland Kitchen warfarin (COUMADIN) 1 MG tablet TAKE 1 TABLET AS DIRECTED (TAKE 4 MG ON MONDAY, WEDNESDAY, AND FRIDAY AND 3 MG THE REST OF THE WEEK)  . warfarin (COUMADIN) 3 MG tablet  Take 1 tablet (3 mg total) by mouth daily.  Marland Kitchen warfarin (COUMADIN) 4 MG tablet TAKE 1 TABLET AS DIRECTED (TAKE 4 MG ON MONDAY, WEDNESDAY, AND FRIDAY AND 3 MG THE REST OF THE WEEK)    PHQ 2/9 Scores 10/28/2019 10/10/2019 04/10/2019 05/27/2018  PHQ - 2 Score 0 0 0 0  PHQ- 9 Score 0 0 - -    BP Readings from Last 3 Encounters:  12/10/19 (!) 118/52  10/28/19 110/60  10/10/19 124/68    Physical Exam Vitals and nursing note reviewed.  Constitutional:      Appearance: He is obese.  HENT:     Head: Normocephalic.     Right Ear: Tympanic membrane, ear canal and external ear normal.     Left Ear: Tympanic membrane, ear canal and external ear normal.     Nose: Nose normal. No congestion or rhinorrhea.  Eyes:     General: No scleral icterus.       Right eye: No discharge.        Left eye: No discharge.     Conjunctiva/sclera: Conjunctivae normal.     Pupils: Pupils are equal, round, and reactive to light.  Neck:     Thyroid: No thyromegaly.     Vascular: No JVD.     Trachea: No tracheal deviation.  Cardiovascular:     Rate and Rhythm: Normal rate and regular rhythm.     Heart sounds: Normal heart sounds. No murmur. No friction rub. No gallop.   Pulmonary:     Effort: No respiratory distress.     Breath sounds: Normal breath sounds. No wheezing, rhonchi or rales.  Abdominal:     General: Bowel sounds are normal.     Palpations: Abdomen is soft. There is no mass.     Tenderness: There is no abdominal tenderness. There is no guarding or rebound.  Musculoskeletal:        General: No tenderness. Normal range of motion.     Cervical back: Normal range of motion and neck supple.  Lymphadenopathy:     Cervical: No cervical adenopathy.  Skin:    General: Skin is warm.     Capillary Refill: Capillary refill takes less than 2 seconds.     Findings: No rash.  Neurological:     Mental Status: He is alert and oriented to person, place, and time.     Cranial Nerves: No cranial nerve deficit.      Deep Tendon Reflexes: Reflexes are normal and symmetric.     Wt Readings from Last 3 Encounters:  12/10/19 213 lb (96.6 kg)  10/28/19 222 lb (100.7 kg)  10/10/19 228 lb (103.4 kg)    BP (!) 118/52   Pulse 64   Ht 5\' 11"  (1.803 m)   Wt 213 lb (96.6 kg)   BMI 29.71 kg/m    Assessment and Plan: 1. Diabetes mellitus, new onset (HCC) New onset.  On April 5 patient was noted to have an A1c of 9.3.  We attempted to initiate Metformin but patient did not want to take another medication and instead try the diet patient has lost weight due to the dietary indiscretion and has been measuring his  blood sugars and they have been in a reasonable range of 100-1 30.  We will recheck an A1c to see if dietary approach and realization of his diabetes has put him in a position that he can control it conservatively. - Hemoglobin A1c

## 2019-12-11 LAB — HEMOGLOBIN A1C
Est. average glucose Bld gHb Est-mCnc: 151 mg/dL
Hgb A1c MFr Bld: 6.9 % — ABNORMAL HIGH (ref 4.8–5.6)

## 2020-01-12 DIAGNOSIS — H53032 Strabismic amblyopia, left eye: Secondary | ICD-10-CM | POA: Diagnosis not present

## 2020-01-12 DIAGNOSIS — H01003 Unspecified blepharitis right eye, unspecified eyelid: Secondary | ICD-10-CM | POA: Diagnosis not present

## 2020-01-12 DIAGNOSIS — H2513 Age-related nuclear cataract, bilateral: Secondary | ICD-10-CM | POA: Diagnosis not present

## 2020-01-12 DIAGNOSIS — H01006 Unspecified blepharitis left eye, unspecified eyelid: Secondary | ICD-10-CM | POA: Diagnosis not present

## 2020-01-18 ENCOUNTER — Other Ambulatory Visit: Payer: Self-pay | Admitting: Family Medicine

## 2020-01-19 NOTE — Telephone Encounter (Signed)
Requested medication (s) are due for refill today: yes  Requested medication (s) are on the active medication list: yes  Last refill: 11/27/19  Future visit scheduled: no  Notes to clinic:  not delegated    Requested Prescriptions  Pending Prescriptions Disp Refills   warfarin (COUMADIN) 1 MG tablet [Pharmacy Med Name: WARFARIN TABS 1MG ] 30 tablet 4    Sig: TAKE 1 TABLET AS DIRECTED (TAKE 4 MG ON MONDAY, WEDNESDAY, AND FRIDAY AND 3 MG THE REST OF THE WEEK)      Hematology:  Anticoagulants - warfarin Failed - 01/18/2020  1:04 PM      Failed - This refill cannot be delegated      Failed - If the patient is managed by Coumadin Clinic - route to their Pool. If not, forward to the provider.      Failed - INR in normal range and within 30 days    INR  Date Value Ref Range Status  10/10/2019 1.7 (H) 0.9 - 1.2 Final    Comment:    Reference interval is for non-anticoagulated patients. Suggested INR therapeutic range for Vitamin K antagonist therapy:    Standard Dose (moderate intensity                   therapeutic range):       2.0 - 3.0    Higher intensity therapeutic range       2.5 - 3.5           Passed - Valid encounter within last 3 months    Recent Outpatient Visits           1 month ago Diabetes mellitus, new onset (HCC)   [facility] 12/10/2019, MD   2 months ago Diabetes mellitus, new onset [facility])   [facility], MD   3 months ago Essential hypertension   [facility] Duanne Limerick, MD   9 months ago Essential hypertension   [facility] Duanne Limerick, MD   1 year ago Cerebral vascular disease   [facility], MD

## 2020-03-24 ENCOUNTER — Other Ambulatory Visit: Payer: Self-pay

## 2020-03-24 ENCOUNTER — Telehealth: Payer: Self-pay | Admitting: Family Medicine

## 2020-03-24 DIAGNOSIS — Z7901 Long term (current) use of anticoagulants: Secondary | ICD-10-CM | POA: Diagnosis not present

## 2020-03-24 NOTE — Progress Notes (Signed)
Recheck for INR/PT. Printed lab and placed up front for pick up.   CM

## 2020-03-24 NOTE — Telephone Encounter (Signed)
Copied from CRM [id number]. Topic: Appointment Scheduling - Scheduling Inquiry for Clinic >> Mar 24, 2020  9:24 AM Crist Infante wrote: Reason for CRM: pt states he needs INR today and Delice Bison usually sets that up for him. Pt states he comes by and picks up order. Would like cb. If he isn't in, ok to lvm.

## 2020-03-24 NOTE — Telephone Encounter (Signed)
Printed INR and placed up front for pick up. Patient aware to pick up today before 11am.  CM

## 2020-03-25 LAB — PROTIME-INR
INR: 1.3 — ABNORMAL HIGH (ref 0.9–1.2)
Prothrombin Time: 13.8 s — ABNORMAL HIGH (ref 9.1–12.0)

## 2020-03-25 NOTE — Progress Notes (Signed)
Spoke with pt. Informed to change coumadin to 3 mg tuesdays and thursdays, and then 4 mg for the rest of the days of the week. He verbalized understanding.   Put patient on the schedule in 6 weeks for pt/inr recheck.   CM

## 2020-05-06 ENCOUNTER — Other Ambulatory Visit: Payer: Self-pay | Admitting: Family Medicine

## 2020-05-06 ENCOUNTER — Other Ambulatory Visit: Payer: Self-pay

## 2020-05-06 ENCOUNTER — Other Ambulatory Visit: Payer: Medicare Other

## 2020-05-06 DIAGNOSIS — Z7901 Long term (current) use of anticoagulants: Secondary | ICD-10-CM

## 2020-05-06 DIAGNOSIS — I679 Cerebrovascular disease, unspecified: Secondary | ICD-10-CM

## 2020-05-06 NOTE — Telephone Encounter (Signed)
Requested medication (s) are due for refill today: Yes  Requested medication (s) are on the active medication list: Yes  Last refill:  04/10/19  Future visit scheduled: No  Notes to clinic:  Prescription has expired.    Requested Prescriptions  Pending Prescriptions Disp Refills   warfarin (COUMADIN) 3 MG tablet [Pharmacy Med Name: WARFARIN TABS 3MG ] 90 tablet 3    Sig: TAKE 1 TABLET DAILY OR AS DIRECTED      Hematology:  Anticoagulants - warfarin Failed - 05/06/2020 11:27 AM      Failed - This refill cannot be delegated      Failed - If the patient is managed by Coumadin Clinic - route to their Pool. If not, forward to the provider.      Failed - INR in normal range and within 30 days    INR  Date Value Ref Range Status  03/24/2020 1.3 (H) 0.9 - 1.2 Final    Comment:    Reference interval is for non-anticoagulated patients. Suggested INR therapeutic range for Vitamin K antagonist therapy:    Standard Dose (moderate intensity                   therapeutic range):       2.0 - 3.0    Higher intensity therapeutic range       2.5 - 3.5           Failed - Valid encounter within last 3 months    Recent Outpatient Visits           4 months ago Diabetes mellitus, new onset (HCC)   [facility] 03/26/2020, MD   6 months ago Diabetes mellitus, new onset (HCC)   [facility] Duanne Limerick, MD   6 months ago Essential hypertension   [facility] Duanne Limerick, MD   1 year ago Essential hypertension   [facility] Duanne Limerick, MD   1 year ago Cerebral vascular disease   [facility] Duanne Limerick, MD                warfarin (COUMADIN) 4 MG tablet [Pharmacy Med Name: WARFARIN TABS 4MG ] 90 tablet 3    Sig: TAKE 1 TABLET DAILY OR AS DIRECTED      Hematology:  Anticoagulants - warfarin Failed - 05/06/2020 11:27 AM      Failed - This refill cannot be delegated      Failed - If the patient is managed by Coumadin  Clinic - route to their Pool. If not, forward to the provider.      Failed - INR in normal range and within 30 days    INR  Date Value Ref Range Status  03/24/2020 1.3 (H) 0.9 - 1.2 Final    Comment:    Reference interval is for non-anticoagulated patients. Suggested INR therapeutic range for Vitamin K antagonist therapy:    Standard Dose (moderate intensity                   therapeutic range):       2.0 - 3.0    Higher intensity therapeutic range       2.5 - 3.5           Failed - Valid encounter within last 3 months    Recent Outpatient Visits           4 months ago Diabetes mellitus, new onset (HCC)   [facility] Duanne Limerick, MD   6 months ago Diabetes mellitus, new onset [facility])   [facility] Duanne Limerick, MD   6 months ago Essential hypertension   [facility] Duanne Limerick, MD   1 year ago Essential hypertension   [facility] Duanne Limerick, MD   1 year ago Cerebral vascular disease   [facility] Duanne Limerick, MD

## 2020-05-07 LAB — PROTIME-INR
INR: 1.5 — ABNORMAL HIGH (ref 0.9–1.2)
Prothrombin Time: 15.2 s — ABNORMAL HIGH (ref 9.1–12.0)

## 2020-06-03 ENCOUNTER — Telehealth: Payer: Self-pay | Admitting: Family Medicine

## 2020-06-03 NOTE — Telephone Encounter (Signed)
Left message for patient to call back and schedule Medicare Annual Wellness Visit (AWV) either virtually/audio only or in office. Whichever the patients preference is.  No history of AWV; please schedule at anytime with [facility]Nurse Health Advisor.  This should be a 40 minute visit 

## 2020-06-09 ENCOUNTER — Other Ambulatory Visit: Payer: Self-pay

## 2020-06-09 ENCOUNTER — Encounter: Payer: Self-pay | Admitting: Family Medicine

## 2020-06-09 ENCOUNTER — Ambulatory Visit (INDEPENDENT_AMBULATORY_CARE_PROVIDER_SITE_OTHER): Payer: Medicare Other | Admitting: Family Medicine

## 2020-06-09 VITALS — BP 120/64 | HR 64 | Ht 71.0 in | Wt 204.0 lb

## 2020-06-09 DIAGNOSIS — I4811 Longstanding persistent atrial fibrillation: Secondary | ICD-10-CM | POA: Diagnosis not present

## 2020-06-09 DIAGNOSIS — R739 Hyperglycemia, unspecified: Secondary | ICD-10-CM

## 2020-06-09 DIAGNOSIS — Z7901 Long term (current) use of anticoagulants: Secondary | ICD-10-CM

## 2020-06-09 DIAGNOSIS — D6859 Other primary thrombophilia: Secondary | ICD-10-CM | POA: Diagnosis not present

## 2020-06-09 DIAGNOSIS — E782 Mixed hyperlipidemia: Secondary | ICD-10-CM

## 2020-06-09 DIAGNOSIS — I679 Cerebrovascular disease, unspecified: Secondary | ICD-10-CM | POA: Diagnosis not present

## 2020-06-09 DIAGNOSIS — J301 Allergic rhinitis due to pollen: Secondary | ICD-10-CM

## 2020-06-09 DIAGNOSIS — I10 Essential (primary) hypertension: Secondary | ICD-10-CM

## 2020-06-09 MED ORDER — FLUTICASONE PROPIONATE 50 MCG/ACT NA SUSP: 1.0000 | Freq: Every day | NASAL | 1 refills | Status: DC

## 2020-06-09 MED ORDER — WARFARIN SODIUM 4 MG PO TABS: ORAL_TABLET | ORAL | 1 refills | Status: DC

## 2020-06-09 MED ORDER — WARFARIN SODIUM 3 MG PO TABS: ORAL_TABLET | ORAL | 1 refills | Status: AC

## 2020-06-09 MED ORDER — ATENOLOL 25 MG PO TABS: 25.0000 mg | ORAL_TABLET | Freq: Every day | ORAL | 1 refills | Status: DC

## 2020-06-09 MED ORDER — LISINOPRIL 40 MG PO TABS: 40.0000 mg | ORAL_TABLET | Freq: Every day | ORAL | 1 refills | Status: DC

## 2020-06-09 NOTE — Progress Notes (Signed)
inr    Date:  06/09/2020   Name:  [name]   DOB:  [birth year]   MRN:  [id number]   Chief Complaint: Diabetes (off metformin), Hypertension, Cerebrovascular Accident, and Allergic Rhinitis   Diabetes He presents for his follow-up diabetic visit. He has type 2 diabetes mellitus. His disease course has been stable. There are no hypoglycemic associated symptoms. Pertinent negatives for hypoglycemia include no dizziness, headaches, nervousness/anxiousness or sweats. Pertinent negatives for diabetes include no blurred vision, no chest pain, no fatigue, no foot paresthesias, no foot ulcerations, no polydipsia, no polyphagia, no polyuria, no visual change, no weakness and no weight loss. There are no hypoglycemic complications. Symptoms are stable. There are no diabetic complications. Pertinent negatives for diabetic complications include no CVA, PVD or retinopathy. Current diabetic treatment includes diet. He is compliant with treatment some of the time.  Hypertension This is a chronic problem. The current episode started more than 1 year ago. The problem has been waxing and waning since onset. The problem is controlled. Pertinent negatives include no anxiety, blurred vision, chest pain, headaches, malaise/fatigue, neck pain, orthopnea, palpitations, peripheral edema, PND, shortness of breath or sweats. There are no associated agents to hypertension. Risk factors for coronary artery disease include dyslipidemia, male gender and diabetes mellitus. Past treatments include ACE inhibitors. The current treatment provides mild improvement. There are no compliance problems.  There is no history of angina, kidney disease, CAD/MI, CVA, heart failure, left ventricular hypertrophy, PVD or retinopathy. There is no history of chronic renal disease, a hypertension causing med or renovascular disease.  Cerebrovascular Accident This is a chronic problem. The current episode started more than 1 year ago. The problem has  been gradually improving. Pertinent negatives include no abdominal pain, anorexia, arthralgias, change in bowel habit, chest pain, chills, congestion, coughing, diaphoresis, fatigue, fever, headaches, joint swelling, myalgias, nausea, neck pain, numbness, rash, sore throat, swollen glands, urinary symptoms, vertigo, visual change, vomiting or weakness.    Lab Results  Component Value Date   CREATININE 1.04 10/10/2019   BUN 13 10/10/2019   NA 144 10/10/2019   K 4.5 10/10/2019   CL 101 10/10/2019   CO2 27 10/10/2019   Lab Results  Component Value Date   CHOL 173 10/10/2019   HDL 43 10/10/2019   LDLCALC 103 (H) 10/10/2019   TRIG 154 (H) 10/10/2019   CHOLHDL 4.0 10/10/2019   No results found for: TSH Lab Results  Component Value Date   HGBA1C 6.9 (H) 12/10/2019   No results found for: WBC, HGB, HCT, MCV, PLT No results found for: ALT, AST, GGT, ALKPHOS, BILITOT   Review of Systems  Constitutional: Negative for chills, diaphoresis, fatigue, fever, malaise/fatigue and weight loss.  HENT: Negative for congestion, drooling, ear discharge, ear pain and sore throat.   Eyes: Negative for blurred vision.  Respiratory: Negative for cough, shortness of breath and wheezing.   Cardiovascular: Negative for chest pain, palpitations, orthopnea, leg swelling and PND.  Gastrointestinal: Negative for abdominal pain, anorexia, blood in stool, change in bowel habit, constipation, diarrhea, nausea and vomiting.  Endocrine: Negative for polydipsia, polyphagia and polyuria.  Genitourinary: Negative for dysuria, frequency, hematuria and urgency.  Musculoskeletal: Negative for arthralgias, back pain, joint swelling, myalgias and neck pain.  Skin: Negative for rash.  Allergic/Immunologic: Negative for environmental allergies.  Neurological: Negative for dizziness, vertigo, weakness, numbness and headaches.  Hematological: Does not bruise/bleed easily.  Psychiatric/Behavioral: Negative for suicidal  ideas. The patient is not nervous/anxious.     Patient  Active Problem List   Diagnosis Date Noted  . Essential hypertension 11/29/2017  . Chronic seasonal allergic rhinitis due to pollen 11/29/2017  . Cerebral vascular disease 11/29/2017  . Anticoagulated on Coumadin 02/23/2016    Allergies  Allergen Reactions  . Latex     Past Surgical History:  Procedure Laterality Date  . HERNIA REPAIR      Social History   Tobacco Use  . Smoking status: Never Smoker  . Smokeless tobacco: Never Used  Substance Use Topics  . Alcohol use: No    Alcohol/week: 0.0 standard drinks  . Drug use: No     Medication list has been reviewed and updated.  Current Meds  Medication Sig  . atenolol (TENORMIN) 25 MG tablet Take 1 tablet (25 mg total) by mouth daily.  . fluticasone (FLONASE) 50 MCG/ACT nasal spray USE 1 SPRAY IN EACH NOSTRIL DAILY  . lisinopril (ZESTRIL) 40 MG tablet Take 1 tablet (40 mg total) by mouth daily.  Marland Kitchen. warfarin (COUMADIN) 1 MG tablet TAKE 1 TABLET AS DIRECTED (TAKE 4 MG ON MONDAY, WEDNESDAY, AND FRIDAY AND 3 MG THE REST OF THE WEEK)  . warfarin (COUMADIN) 3 MG tablet TAKE 1 TABLET DAILY OR AS DIRECTED  . warfarin (COUMADIN) 4 MG tablet TAKE 1 TABLET DAILY OR AS DIRECTED    PHQ 2/9 Scores 06/09/2020 10/28/2019 10/10/2019 04/10/2019  PHQ - 2 Score 0 0 0 0  PHQ- 9 Score 0 0 0 -    GAD 7 : Generalized Anxiety Score 06/09/2020 10/28/2019 10/10/2019  Nervous, Anxious, on Edge 0 0 0  Control/stop worrying 0 0 0  Worry too much - different things 0 0 0  Trouble relaxing 0 0 0  Restless 0 0 0  Easily annoyed or irritable 0 0 0  Afraid - awful might happen 0 0 0  Total GAD 7 Score 0 0 0    BP Readings from Last 3 Encounters:  06/09/20 120/64  12/10/19 (!) 118/52  10/28/19 110/60    Physical Exam Vitals and nursing note reviewed.  HENT:     Head: Normocephalic.     Right Ear: External ear normal.     Left Ear: External ear normal.     Nose: Nose normal. No congestion  or rhinorrhea.  Eyes:     General: No scleral icterus.       Right eye: No discharge.        Left eye: No discharge.     Conjunctiva/sclera: Conjunctivae normal.     Pupils: Pupils are equal, round, and reactive to light.  Neck:     Thyroid: No thyromegaly.     Vascular: No JVD.     Trachea: No tracheal deviation.  Cardiovascular:     Rate and Rhythm: Normal rate and regular rhythm.     Heart sounds: Normal heart sounds, S1 normal and S2 normal. No murmur heard.  No systolic murmur is present.  No diastolic murmur is present.  No friction rub. No gallop. No S3 or S4 sounds.   Pulmonary:     Effort: No respiratory distress.     Breath sounds: Normal breath sounds. No decreased breath sounds, wheezing, rhonchi or rales.  Abdominal:     General: Bowel sounds are normal.     Palpations: Abdomen is soft. There is no mass.     Tenderness: There is no abdominal tenderness. There is no guarding or rebound.  Musculoskeletal:        General: No tenderness. Normal range  of motion.     Cervical back: Normal range of motion and neck supple.  Lymphadenopathy:     Cervical: No cervical adenopathy.  Skin:    General: Skin is warm.     Findings: No rash.  Neurological:     Mental Status: He is alert and oriented to person, place, and time.     Cranial Nerves: No cranial nerve deficit.     Deep Tendon Reflexes: Reflexes are normal and symmetric.     Wt Readings from Last 3 Encounters:  06/09/20 204 lb (92.5 kg)  12/10/19 213 lb (96.6 kg)  10/28/19 222 lb (100.7 kg)    BP 120/64   Pulse 64   Ht 5\' 11"  (1.803 m)   Wt 204 lb (92.5 kg)   BMI 28.45 kg/m   Assessment and Plan: 1. Essential hypertension Chronic.  Controlled.  Stable.  Blood pressure today is 120/64.  Continue atenolol 25 mg once a day and lisinopril 40 mg once a day.  Will check renal function panel for electrolytes and GFR. - Renal Function Panel - atenolol (TENORMIN) 25 MG tablet; Take 1 tablet (25 mg total) by mouth  daily.  Dispense: 90 tablet; Refill: 1 - lisinopril (ZESTRIL) 40 MG tablet; Take 1 tablet (40 mg total) by mouth daily.  Dispense: 90 tablet; Refill: 1  2. Chronic seasonal allergic rhinitis due to pollen Chronic.  Controlled.  Stable.  This is seasonal and patient will continue fluticasone nasally as needed - fluticasone (FLONASE) 50 MCG/ACT nasal spray; Place 1 spray into both nostrils daily.  Dispense: 16 g; Refill: 1  3. Cerebral vascular disease Chronic.  Controlled.  Stable.  Cerebrovascular disease is controlled by maintaining control of lipids, diabetes, and blood pressure.  Patient will continue on warfarin therapy to reduce risk of thrombogenic CVAs. - warfarin (COUMADIN) 3 MG tablet; TAKE 1 TABLET DAILY OR AS DIRECTED  Dispense: 90 tablet; Refill: 1 - warfarin (COUMADIN) 4 MG tablet; TAKE 1 TABLET DAILY OR AS DIRECTED  Dispense: 90 tablet; Refill: 1  4. Thrombophilia (HCC) Patient has acquired thrombophilia with atrial fibrillation longstanding followed by cardiology and is currently on anticoagulation via Coumadin.  5. Anticoagulated on Coumadin Chronic.  Controlled.  Stable.  Continue warfarin as directed will check INR today. - warfarin (COUMADIN) 3 MG tablet; TAKE 1 TABLET DAILY OR AS DIRECTED  Dispense: 90 tablet; Refill: 1 - warfarin (COUMADIN) 4 MG tablet; TAKE 1 TABLET DAILY OR AS DIRECTED  Dispense: 90 tablet; Refill: 1 - Protime-INR  6. Longstanding persistent atrial fibrillation [facility]) Patient is followed by cardiology for longstanding atrial fibrillation.  We will continue with anticoagulation for Coumadin. - warfarin (COUMADIN) 3 MG tablet; TAKE 1 TABLET DAILY OR AS DIRECTED  Dispense: 90 tablet; Refill: 1 - warfarin (COUMADIN) 4 MG tablet; TAKE 1 TABLET DAILY OR AS DIRECTED  Dispense: 90 tablet; Refill: 1 - Protime-INR  7. 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  Controlled.  Stable.  Continue dietary control at this time.  Will discuss  statin after checking lipid panel as well as A1c. - HgB A1c - Lipid Panel With LDL/HDL Ratio  8. Hyperglycemia Chronic.  Episodic.  This varies depending on the patient's diet with A1c is between 9 and 6.  Patient is currently not taking Metformin due to trepidation after speaking with the pharmacist.  Patient feels that dietary control probably is sufficient however I am not certain and we will check an A1c in pending that determine if we resume  Metformin once or twice a day.  We will check microalbuminuria at this time but patient is on lisinopril. - HgB A1c - Microalbumin, urine

## 2020-06-10 LAB — LIPID PANEL WITH LDL/HDL RATIO
Cholesterol, Total: 176 mg/dL (ref 100–199)
HDL: 43 mg/dL (ref >39)
LDL Chol Calc (NIH): 104 mg/dL — ABNORMAL HIGH (ref 0–99)
LDL/HDL Ratio: 2.4 ratio (ref 0.0–3.6)
Triglycerides: 164 mg/dL — ABNORMAL HIGH (ref 0–149)
VLDL Cholesterol Cal: 29 mg/dL (ref 5–40)

## 2020-06-10 LAB — PROTIME-INR
INR: 1.3 — ABNORMAL HIGH (ref 0.9–1.2)
Prothrombin Time: 13.9 s — ABNORMAL HIGH (ref 9.1–12.0)

## 2020-06-10 LAB — MICROALBUMIN, URINE: Microalbumin, Urine: 14.6 ug/mL

## 2020-06-10 LAB — RENAL FUNCTION PANEL
Albumin: 3.8 g/dL (ref 3.7–4.7)
BUN/Creatinine Ratio: 16 (ref 10–24)
BUN: 17 mg/dL (ref 8–27)
CO2: 28 mmol/L (ref 20–29)
Calcium: 9.5 mg/dL (ref 8.6–10.2)
Chloride: 99 mmol/L (ref 96–106)
Creatinine, Ser: 1.04 mg/dL (ref 0.76–1.27)
GFR calc Af Amer: 82 mL/min/{1.73_m2} (ref >59)
GFR calc non Af Amer: 71 mL/min/{1.73_m2} (ref >59)
Glucose: 137 mg/dL — ABNORMAL HIGH (ref 65–99)
Phosphorus: 2.6 mg/dL — ABNORMAL LOW (ref 2.8–4.1)
Potassium: 4.5 mmol/L (ref 3.5–5.2)
Sodium: 139 mmol/L (ref 134–144)

## 2020-06-10 LAB — HEMOGLOBIN A1C
Est. average glucose Bld gHb Est-mCnc: 140 mg/dL
Hgb A1c MFr Bld: 6.5 % — ABNORMAL HIGH (ref 4.8–5.6)

## 2020-06-11 ENCOUNTER — Telehealth: Payer: Self-pay

## 2020-06-11 ENCOUNTER — Other Ambulatory Visit: Payer: Self-pay

## 2020-06-11 DIAGNOSIS — E782 Mixed hyperlipidemia: Secondary | ICD-10-CM

## 2020-06-11 MED ORDER — ROSUVASTATIN CALCIUM 10 MG PO TABS: 10.0000 mg | ORAL_TABLET | ORAL | 0 refills | Status: DC

## 2020-06-11 NOTE — Telephone Encounter (Signed)
Copied from CRM [id number]. Topic: Quick Communication - Office Called Patient (Clinic Use ONLY) >> Jun 11, 2020  7:49 AM Leafy Ro wrote: Reason for CRM: pt is returning tara call concerning blood work results

## 2020-06-11 NOTE — Telephone Encounter (Signed)
Called in crestor once a week- recheck in 4 weeks

## 2020-07-10 ENCOUNTER — Other Ambulatory Visit: Payer: Self-pay | Admitting: Family Medicine

## 2020-07-10 DIAGNOSIS — E782 Mixed hyperlipidemia: Secondary | ICD-10-CM

## 2020-07-10 NOTE — Telephone Encounter (Signed)
Requested medications are due for refill today yes  Requested medications are on the active medication list yes  Last refill 11/5  Last visit 11/3  Future visit scheduled no  Notes to clinic OV states needs to have lab checked again after 4 weeks.

## 2020-07-12 ENCOUNTER — Other Ambulatory Visit: Payer: Medicare Other

## 2020-07-12 ENCOUNTER — Telehealth: Payer: Self-pay

## 2020-07-12 ENCOUNTER — Other Ambulatory Visit: Payer: Self-pay

## 2020-07-12 ENCOUNTER — Other Ambulatory Visit: Payer: Self-pay | Admitting: Family Medicine

## 2020-07-12 DIAGNOSIS — E782 Mixed hyperlipidemia: Secondary | ICD-10-CM

## 2020-07-12 DIAGNOSIS — Z7901 Long term (current) use of anticoagulants: Secondary | ICD-10-CM

## 2020-07-12 NOTE — Telephone Encounter (Signed)
Requested medication (s) are due for refill today:   Yes  Requested medication (s) are on the active medication list:   Yes  Future visit scheduled:   Yes with Dr Judithann Graves in 4 mo.   Last ordered: 01/19/2020 #90, 0 refills  Returned because per protocol this is a non delegated refill.   Requested Prescriptions  Pending Prescriptions Disp Refills   warfarin (COUMADIN) 1 MG tablet [Pharmacy Med Name: WARFARIN TABS 1MG ] 90 tablet 3    Sig: TAKE 1 TABLET AS DIRECTED (TAKE 4 MG ON MONDAY, WEDNESDAY, AND FRIDAY AND 3 MG THE REST OF THE WEEK)      Hematology:  Anticoagulants - warfarin Failed - 07/12/2020  1:08 AM      Failed - This refill cannot be delegated      Failed - If the patient is managed by Coumadin Clinic - route to their Pool. If not, forward to the provider.      Failed - INR in normal range and within 30 days    INR  Date Value Ref Range Status  06/09/2020 1.3 (H) 0.9 - 1.2 Final    Comment:    Reference interval is for non-anticoagulated patients. Suggested INR therapeutic range for Vitamin K antagonist therapy:    Standard Dose (moderate intensity                   therapeutic range):       2.0 - 3.0    Higher intensity therapeutic range       2.5 - 3.5           Passed - Valid encounter within last 3 months    Recent Outpatient Visits           1 month ago Essential hypertension   [facility] 13/10/2019, MD   7 months ago Diabetes mellitus, new onset [facility])   [facility], MD   8 months ago Diabetes mellitus, new onset [facility])   [facility], MD   9 months ago Essential hypertension   [facility] Duanne Limerick, MD   1 year ago Essential hypertension   [facility] Duanne Limerick, MD       Future Appointments             In 4 months Duanne Limerick, MD [facility], [facility]

## 2020-07-12 NOTE — Telephone Encounter (Unsigned)
Copied from CRM [id number]. Topic: General - Inquiry >> Jul 12, 2020  8:47 AM Leafy Ro wrote: Reason for CRM: Pt is calling and would like order to have his coumadin check through [facility]. Pt would like to pick up order today by 11 am

## 2020-07-12 NOTE — Telephone Encounter (Signed)
Put on schedule and hit arrived 

## 2020-07-13 LAB — LIPID PANEL WITH LDL/HDL RATIO
Cholesterol, Total: 190 mg/dL (ref 100–199)
HDL: 52 mg/dL (ref >39)
LDL Chol Calc (NIH): 106 mg/dL — ABNORMAL HIGH (ref 0–99)
LDL/HDL Ratio: 2 ratio (ref 0.0–3.6)
Triglycerides: 183 mg/dL — ABNORMAL HIGH (ref 0–149)
VLDL Cholesterol Cal: 32 mg/dL (ref 5–40)

## 2020-07-13 LAB — PROTIME-INR
INR: 1.7 — ABNORMAL HIGH (ref 0.9–1.2)
Prothrombin Time: 17.2 s — ABNORMAL HIGH (ref 9.1–12.0)

## 2020-07-14 ENCOUNTER — Telehealth: Payer: Self-pay

## 2020-07-14 NOTE — Telephone Encounter (Unsigned)
Copied from CRM [id number]. Topic: General - Inquiry >> Jul 14, 2020 12:11 PM Crist Infante wrote: Reason for CRM: pt states he did not fast for his lipid lab.  Pt ate breakfast right before he came in.  Pt states he can redo when he comes in next time for coum lab

## 2020-07-15 NOTE — Telephone Encounter (Signed)
Tell him we can recheck fasting in 2 weeks

## 2020-08-02 ENCOUNTER — Other Ambulatory Visit: Payer: Self-pay

## 2020-08-02 ENCOUNTER — Ambulatory Visit (INDEPENDENT_AMBULATORY_CARE_PROVIDER_SITE_OTHER): Payer: Medicare Other | Admitting: Family Medicine

## 2020-08-02 ENCOUNTER — Encounter: Payer: Self-pay | Admitting: Family Medicine

## 2020-08-02 VITALS — BP 130/72 | HR 81 | Ht 71.0 in | Wt 209.0 lb

## 2020-08-02 DIAGNOSIS — S81802A Unspecified open wound, left lower leg, initial encounter: Secondary | ICD-10-CM

## 2020-08-02 MED ORDER — CEPHALEXIN 500 MG PO CAPS: 500.0000 mg | ORAL_CAPSULE | Freq: Three times a day (TID) | ORAL | 0 refills | Status: DC

## 2020-08-02 NOTE — Patient Instructions (Signed)
Wound Care, Adult Taking care of your wound properly can help to prevent pain, infection, and scarring. It can also help your wound to heal more quickly. How to care for your wound Wound care      Follow instructions from your health care provider about how to take care of your wound. Make sure you: ? Wash your hands with soap and water before you change the bandage (dressing). If soap and water are not available, use hand sanitizer. ? Change your dressing as told by your health care provider. ? Leave stitches (sutures), skin glue, or adhesive strips in place. These skin closures may need to stay in place for 2 weeks or longer. If adhesive strip edges start to loosen and curl up, you may trim the loose edges. Do not remove adhesive strips completely unless your health care provider tells you to do that.  Check your wound area every day for signs of infection. Check for: ? Redness, swelling, or pain. ? Fluid or blood. ? Warmth. ? Pus or a bad smell.  Ask your health care provider if you should clean the wound with mild soap and water. Doing this may include: ? Using a clean towel to pat the wound dry after cleaning it. Do not rub or scrub the wound. ? Applying a cream or ointment. Do this only as told by your health care provider. ? Covering the incision with a clean dressing.  Ask your health care provider when you can leave the wound uncovered.  Keep the dressing dry until your health care provider says it can be removed. Do not take baths, swim, use a hot tub, or do anything that would put the wound underwater until your health care provider approves. Ask your health care provider if you can take showers. You may only be allowed to take sponge baths. Medicines   If you were prescribed an antibiotic medicine, cream, or ointment, take or use the antibiotic as told by your health care provider. Do not stop taking or using the antibiotic even if your condition improves.  Take  over-the-counter and prescription medicines only as told by your health care provider. If you were prescribed pain medicine, take it 30 or more minutes before you do any wound care or as told by your health care provider. General instructions  Return to your normal activities as told by your health care provider. Ask your health care provider what activities are safe.  Do not scratch or pick at the wound.  Do not use any products that contain nicotine or tobacco, such as cigarettes and e-cigarettes. These may delay wound healing. If you need help quitting, ask your health care provider.  Keep all follow-up visits as told by your health care provider. This is important.  Eat a diet that includes protein, vitamin A, vitamin C, and other nutrient-rich foods to help the wound heal. ? Foods rich in protein include meat, dairy, beans, nuts, and other sources. ? Foods rich in vitamin A include carrots and dark green, leafy vegetables. ? Foods rich in vitamin C include citrus, tomatoes, and other fruits and vegetables. ? Nutrient-rich foods have protein, carbohydrates, fat, vitamins, or minerals. Eat a variety of healthy foods including vegetables, fruits, and whole grains. Contact a health care provider if:  You received a tetanus shot and you have swelling, severe pain, redness, or bleeding at the injection site.  Your pain is not controlled with medicine.  You have redness, swelling, or pain around the wound.    You have fluid or blood coming from the wound.  Your wound feels warm to the touch.  You have pus or a bad smell coming from the wound.  You have a fever or chills.  You are nauseous or you vomit.  You are dizzy. Get help right away if:  You have a red streak going away from your wound.  The edges of the wound open up and separate.  Your wound is bleeding, and the bleeding does not stop with gentle pressure.  You have a rash.  You faint.  You have trouble  breathing. Summary  Always wash your hands with soap and water before changing your bandage (dressing).  To help with healing, eat foods that are rich in protein, vitamin A, vitamin C, and other nutrients.  Check your wound every day for signs of infection. Contact your health care provider if you suspect that your wound is infected. This information is not intended to replace advice given to you by your health care provider. Make sure you discuss any questions you have with your health care provider. Document Revised: 11/11/2018 Document Reviewed: 02/08/2016 Elsevier Patient Education  2020 [employer]  

## 2020-08-02 NOTE — Progress Notes (Signed)
Date:  08/02/2020   Name:  [name]   DOB:  07/04/1946   MRN:  [id number]   Chief Complaint: Wound Check (Trying to adjust exercise bike for granddaughter at home and dropped it on left calf. Did this Friday- Christmas Eve. )  Wound Check He was originally treated 3 to 5 days ago. Previous treatment included wound cleansing or irrigation. There has been no drainage from the wound. There is no redness present. There is no swelling present. There is no pain present.    Lab Results  Component Value Date   CREATININE 1.04 06/09/2020   BUN 17 06/09/2020   NA 139 06/09/2020   K 4.5 06/09/2020   CL 99 06/09/2020   CO2 28 06/09/2020   Lab Results  Component Value Date   CHOL 190 07/12/2020   HDL 52 07/12/2020   LDLCALC 106 (H) 07/12/2020   TRIG 183 (H) 07/12/2020   CHOLHDL 4.0 10/10/2019   No results found for: TSH Lab Results  Component Value Date   HGBA1C 6.5 (H) 06/09/2020   No results found for: WBC, HGB, HCT, MCV, PLT No results found for: ALT, AST, GGT, ALKPHOS, BILITOT   Review of Systems  Constitutional: Negative for chills and fever.  HENT: Negative for drooling, ear discharge, ear pain and sore throat.   Respiratory: Negative for cough, shortness of breath and wheezing.   Cardiovascular: Negative for chest pain, palpitations and leg swelling.  Gastrointestinal: Negative for abdominal pain, blood in stool, constipation, diarrhea and nausea.  Endocrine: Negative for polydipsia.  Genitourinary: Negative for dysuria, frequency, hematuria and urgency.  Musculoskeletal: Negative for back pain, myalgias and neck pain.  Skin: Negative for rash.  Allergic/Immunologic: Negative for environmental allergies.  Neurological: Negative for dizziness and headaches.  Hematological: Does not bruise/bleed easily.  Psychiatric/Behavioral: Negative for suicidal ideas. The patient is not nervous/anxious.     Patient Active Problem List   Diagnosis Date Noted  .  Longstanding persistent atrial fibrillation (HCC) 06/09/2020  . 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 Essential hypertension 11/29/2017  . Chronic seasonal allergic rhinitis due to pollen 11/29/2017  . Cerebral vascular disease 11/29/2017  . Anticoagulated on Coumadin 02/23/2016    Allergies  Allergen Reactions  . Latex     Past Surgical History:  Procedure Laterality Date  . HERNIA REPAIR      Social History   Tobacco Use  . Smoking status: Never Smoker  . Smokeless tobacco: Never Used  Substance Use Topics  . Alcohol use: No    Alcohol/week: 0.0 standard drinks  . Drug use: No     Medication list has been reviewed and updated.  Current Meds  Medication Sig  . atenolol (TENORMIN) 25 MG tablet Take 1 tablet (25 mg total) by mouth daily.  . fluticasone (FLONASE) 50 MCG/ACT nasal spray Place 1 spray into both nostrils daily.  Marland Kitchen lisinopril (ZESTRIL) 40 MG tablet Take 1 tablet (40 mg total) by mouth daily.  . rosuvastatin (CRESTOR) 10 MG tablet TAKE 1 TABLET (10 MG TOTAL) BY MOUTH ONCE A WEEK.  . warfarin (COUMADIN) 1 MG tablet TAKE 1 TABLET AS DIRECTED (TAKE 4 MG ON MONDAY, WEDNESDAY, AND FRIDAY AND 3 MG THE REST OF THE WEEK)  . warfarin (COUMADIN) 3 MG tablet TAKE 1 TABLET DAILY OR AS DIRECTED  . warfarin (COUMADIN) 4 MG tablet TAKE 1 TABLET DAILY OR AS DIRECTED    PHQ 2/9 Scores 08/02/2020 06/09/2020 10/28/2019 10/10/2019  PHQ -  2 Score 0 0 0 0  PHQ- 9 Score 0 0 0 0    GAD 7 : Generalized Anxiety Score 08/02/2020 06/09/2020 10/28/2019 10/10/2019  Nervous, Anxious, on Edge 0 0 0 0  Control/stop worrying 0 0 0 0  Worry too much - different things 0 0 0 0  Trouble relaxing 0 0 0 0  Restless 0 0 0 0  Easily annoyed or irritable 0 0 0 0  Afraid - awful might happen 0 0 0 0  Total GAD 7 Score 0 0 0 0  Anxiety Difficulty Not difficult at all - - -    BP Readings from Last 3 Encounters:  08/02/20 130/72  06/09/20 120/64  12/10/19 (!)  118/52    Physical Exam Vitals and nursing note reviewed.  HENT:     Head: Normocephalic.     Right Ear: External ear normal.     Left Ear: External ear normal.     Nose: Nose normal.     Mouth/Throat:     Mouth: Oropharynx is clear and moist.  Eyes:     General: No scleral icterus.       Right eye: No discharge.        Left eye: No discharge.     Extraocular Movements: EOM normal.     Conjunctiva/sclera: Conjunctivae normal.     Pupils: Pupils are equal, round, and reactive to light.  Neck:     Thyroid: No thyromegaly.     Vascular: No JVD.     Trachea: No tracheal deviation.  Cardiovascular:     Rate and Rhythm: Normal rate and regular rhythm.     Pulses: Intact distal pulses.     Heart sounds: Normal heart sounds. No murmur heard. No friction rub. No gallop.   Pulmonary:     Effort: No respiratory distress.     Breath sounds: Normal breath sounds. No wheezing or rales.  Abdominal:     General: Bowel sounds are normal.     Palpations: Abdomen is soft. There is no hepatosplenomegaly or mass.     Tenderness: There is no abdominal tenderness. There is no CVA tenderness, guarding or rebound.  Musculoskeletal:        General: No tenderness or edema. Normal range of motion.     Cervical back: Normal range of motion and neck supple.  Lymphadenopathy:     Cervical: No cervical adenopathy.  Skin:    General: Skin is warm.     Findings: Wound present. No rash.     Comments: 3 inch superficial avulsion left lower leg  Neurological:     Mental Status: He is alert and oriented to person, place, and time.     Cranial Nerves: No cranial nerve deficit.     Deep Tendon Reflexes: Strength normal and reflexes are normal and symmetric.     Wt Readings from Last 3 Encounters:  08/02/20 209 lb (94.8 kg)  06/09/20 204 lb (92.5 kg)  12/10/19 213 lb (96.6 kg)    BP 130/72   Pulse 81   Ht 5\' 11"  (1.803 m)   Wt 209 lb (94.8 kg)   SpO2 97%   BMI 29.15 kg/m   Assessment and  Plan:  1. Avulsion of skin of left lower leg, initial encounter Encounter for latent avulsion wound of the left lower leg.  #1 area was cleaned with hydrogen peroxide.  #2 area of avulsion edge was anesthetized with 1% Xylocaine plain.  #3 devitalized tissue was excised with  curved iris scissors.  #4 wet-to-dry dressing was applied with peroxide however patient's family will be using saline to the wound.  #5 layers a dry gauze were applied.  #6 roll gauze to secure dressing in place and taped with paper tape not the skin but to dressing.  #7 mesh was applied.  Patient will initiate cephalexin 500 mg 3 times a day for 5 days.  We will recheck patient in 3 days - cephALEXin (KEFLEX) 500 MG capsule; Take 1 capsule (500 mg total) by mouth 3 (three) times daily.  Dispense: 15 capsule; Refill: 0

## 2020-08-05 ENCOUNTER — Ambulatory Visit (INDEPENDENT_AMBULATORY_CARE_PROVIDER_SITE_OTHER): Payer: Medicare Other | Admitting: Family Medicine

## 2020-08-05 ENCOUNTER — Other Ambulatory Visit: Payer: Self-pay

## 2020-08-05 ENCOUNTER — Encounter: Payer: Self-pay | Admitting: Family Medicine

## 2020-08-05 VITALS — BP 120/60 | HR 72 | Ht 71.0 in | Wt 205.0 lb

## 2020-08-05 DIAGNOSIS — S81802D Unspecified open wound, left lower leg, subsequent encounter: Secondary | ICD-10-CM | POA: Diagnosis not present

## 2020-08-05 NOTE — Progress Notes (Signed)
Date:  08/05/2020   Name:  [name]   DOB:  [birth year]   MRN:  [id number]   Chief Complaint: Follow-up (Leg abrasion)  Patient is a 74 year old male who presents for a wound care exam. The patient reports the following problems: healing well. Health maintenance has been reviewed up to date.   Lab Results  Component Value Date   CREATININE 1.04 06/09/2020   BUN 17 06/09/2020   NA 139 06/09/2020   K 4.5 06/09/2020   CL 99 06/09/2020   CO2 28 06/09/2020   Lab Results  Component Value Date   CHOL 190 07/12/2020   HDL 52 07/12/2020   LDLCALC 106 (H) 07/12/2020   TRIG 183 (H) 07/12/2020   CHOLHDL 4.0 10/10/2019   No results found for: TSH Lab Results  Component Value Date   HGBA1C 6.5 (H) 06/09/2020   No results found for: WBC, HGB, HCT, MCV, PLT No results found for: ALT, AST, GGT, ALKPHOS, BILITOT   Review of Systems  Constitutional: Negative for chills and fever.  HENT: Negative for drooling, ear discharge, ear pain and sore throat.   Respiratory: Negative for cough, shortness of breath and wheezing.   Cardiovascular: Negative for chest pain, palpitations and leg swelling.  Gastrointestinal: Negative for abdominal pain, blood in stool, constipation, diarrhea and nausea.  Endocrine: Negative for polydipsia.  Genitourinary: Negative for dysuria, frequency, hematuria and urgency.  Musculoskeletal: Negative for back pain, myalgias and neck pain.  Skin: Negative for rash.  Allergic/Immunologic: Negative for environmental allergies.  Neurological: Negative for dizziness and headaches.  Hematological: Does not bruise/bleed easily.  Psychiatric/Behavioral: Negative for suicidal ideas. The patient is not nervous/anxious.     Patient Active Problem List   Diagnosis Date Noted   Longstanding persistent atrial fibrillation (HCC) 06/09/2020   Moderate mixed hyperlipidemia not requiring statin therapy 06/09/2020   Essential hypertension 11/29/2017   Chronic  seasonal allergic rhinitis due to pollen 11/29/2017   Cerebral vascular disease 11/29/2017   Anticoagulated on Coumadin 02/23/2016    Allergies  Allergen Reactions   Latex     Past Surgical History:  Procedure Laterality Date   HERNIA REPAIR      Social History   Tobacco Use   Smoking status: Never Smoker   Smokeless tobacco: Never Used  Substance Use Topics   Alcohol use: No    Alcohol/week: 0.0 standard drinks   Drug use: No     Medication list has been reviewed and updated.  Current Meds  Medication Sig   atenolol (TENORMIN) 25 MG tablet Take 1 tablet (25 mg total) by mouth daily.   cephALEXin (KEFLEX) 500 MG capsule Take 1 capsule (500 mg total) by mouth 3 (three) times daily.   fluticasone (FLONASE) 50 MCG/ACT nasal spray Place 1 spray into both nostrils daily.   lisinopril (ZESTRIL) 40 MG tablet Take 1 tablet (40 mg total) by mouth daily.   rosuvastatin (CRESTOR) 10 MG tablet TAKE 1 TABLET (10 MG TOTAL) BY MOUTH ONCE A WEEK.   warfarin (COUMADIN) 1 MG tablet TAKE 1 TABLET AS DIRECTED (TAKE 4 MG ON MONDAY, WEDNESDAY, AND FRIDAY AND 3 MG THE REST OF THE WEEK)   warfarin (COUMADIN) 3 MG tablet TAKE 1 TABLET DAILY OR AS DIRECTED   warfarin (COUMADIN) 4 MG tablet TAKE 1 TABLET DAILY OR AS DIRECTED    PHQ 2/9 Scores 08/02/2020 06/09/2020 10/28/2019 10/10/2019  PHQ - 2 Score 0 0 0 0  PHQ- 9 Score 0 0 0  0    GAD 7 : Generalized Anxiety Score 08/02/2020 06/09/2020 10/28/2019 10/10/2019  Nervous, Anxious, on Edge 0 0 0 0  Control/stop worrying 0 0 0 0  Worry too much - different things 0 0 0 0  Trouble relaxing 0 0 0 0  Restless 0 0 0 0  Easily annoyed or irritable 0 0 0 0  Afraid - awful might happen 0 0 0 0  Total GAD 7 Score 0 0 0 0  Anxiety Difficulty Not difficult at all - - -    BP Readings from Last 3 Encounters:  08/05/20 120/60  08/02/20 130/72  06/09/20 120/64    Physical Exam Vitals and nursing note reviewed.  HENT:     Head:  Normocephalic.     Right Ear: There is no impacted cerumen.     Left Ear: There is no impacted cerumen.     Nose: No congestion or rhinorrhea.     Mouth/Throat:     Mouth: Oropharynx is clear and moist.  Eyes:     General: No scleral icterus.       Right eye: No discharge.        Left eye: No discharge.     Extraocular Movements: EOM normal.     Conjunctiva/sclera: Conjunctivae normal.     Pupils: Pupils are equal, round, and reactive to light.  Neck:     Thyroid: No thyromegaly.     Vascular: No JVD.     Trachea: No tracheal deviation.  Cardiovascular:     Rate and Rhythm: Normal rate and regular rhythm.     Pulses: Intact distal pulses.     Heart sounds: Normal heart sounds. No murmur heard. No friction rub. No gallop.   Pulmonary:     Effort: No respiratory distress.     Breath sounds: Normal breath sounds. No wheezing, rhonchi or rales.  Abdominal:     General: Bowel sounds are normal.     Palpations: Abdomen is soft. There is no hepatosplenomegaly or mass.     Tenderness: There is no abdominal tenderness. There is no CVA tenderness, guarding or rebound.  Musculoskeletal:        General: No tenderness or edema. Normal range of motion.  Skin:    General: Skin is warm.     Findings: No rash.     Comments: Healing well with granulation tissue  Neurological:     Mental Status: He is alert and oriented to person, place, and time.     Cranial Nerves: No cranial nerve deficit.     Deep Tendon Reflexes: Strength normal and reflexes are normal and symmetric.     Wt Readings from Last 3 Encounters:  08/05/20 205 lb (93 kg)  08/02/20 209 lb (94.8 kg)  06/09/20 204 lb (92.5 kg)    BP 120/60    Pulse 72    Ht 5\' 11"  (1.803 m)    Wt 205 lb (93 kg)    BMI 28.59 kg/m    Assessment and Plan: 1. Avulsion of skin of left lower leg, subsequent encounter Healing well with no scan and granulation tissue noted.  Edges are clean.  We will redress wet-to-dry and recheck patient in 1  week.

## 2020-08-11 ENCOUNTER — Telehealth: Payer: Self-pay

## 2020-08-11 NOTE — Telephone Encounter (Unsigned)
Copied from CRM [id number]. Topic: General - Inquiry >> Aug 11, 2020 10:52 AM [name] wrote: Reason for CRM: Patient tested positive on last Friday and he has an appointment Friday at 1:20 and would like for Delice Bison to call him back and let him know if it's okay for him to come in. He can be reached at [phone]. Please advise

## 2020-08-11 NOTE — Telephone Encounter (Signed)
Move out to Monday of next week

## 2020-08-13 ENCOUNTER — Ambulatory Visit: Payer: Medicare Other | Admitting: Family Medicine

## 2020-08-16 ENCOUNTER — Ambulatory Visit (INDEPENDENT_AMBULATORY_CARE_PROVIDER_SITE_OTHER): Payer: Medicare Other | Admitting: Family Medicine

## 2020-08-16 ENCOUNTER — Encounter: Payer: Self-pay | Admitting: Family Medicine

## 2020-08-16 ENCOUNTER — Other Ambulatory Visit: Payer: Self-pay

## 2020-08-16 VITALS — BP 120/70 | HR 64 | Ht 71.0 in | Wt 205.0 lb

## 2020-08-16 DIAGNOSIS — L03116 Cellulitis of left lower limb: Secondary | ICD-10-CM

## 2020-08-16 DIAGNOSIS — S81802D Unspecified open wound, left lower leg, subsequent encounter: Secondary | ICD-10-CM | POA: Diagnosis not present

## 2020-08-16 DIAGNOSIS — Z7901 Long term (current) use of anticoagulants: Secondary | ICD-10-CM | POA: Diagnosis not present

## 2020-08-16 MED ORDER — AMOXICILLIN-POT CLAVULANATE 875-125 MG PO TABS: 1.0000 | ORAL_TABLET | Freq: Two times a day (BID) | ORAL | 0 refills | Status: DC

## 2020-08-16 NOTE — Progress Notes (Signed)
Date:  08/16/2020   Name:  [name]   DOB:  [birth year]   MRN:  [id number]   Chief Complaint: Follow-up (Abrasion from fall on L) leg)  HPI  Lab Results  Component Value Date   CREATININE 1.04 06/09/2020   BUN 17 06/09/2020   NA 139 06/09/2020   K 4.5 06/09/2020   CL 99 06/09/2020   CO2 28 06/09/2020   Lab Results  Component Value Date   CHOL 190 07/12/2020   HDL 52 07/12/2020   LDLCALC 106 (H) 07/12/2020   TRIG 183 (H) 07/12/2020   CHOLHDL 4.0 10/10/2019   No results found for: TSH Lab Results  Component Value Date   HGBA1C 6.5 (H) 06/09/2020   No results found for: WBC, HGB, HCT, MCV, PLT No results found for: ALT, AST, GGT, ALKPHOS, BILITOT   Review of Systems  Constitutional: Negative for chills and fever.  HENT: Negative for drooling, ear discharge, ear pain and sore throat.   Respiratory: Negative for cough, shortness of breath and wheezing.   Cardiovascular: Negative for chest pain, palpitations and leg swelling.  Gastrointestinal: Negative for abdominal pain, blood in stool, constipation, diarrhea and nausea.  Endocrine: Negative for polydipsia.  Genitourinary: Negative for dysuria, frequency, hematuria and urgency.  Musculoskeletal: Negative for back pain, myalgias and neck pain.  Skin: Negative for rash.  Allergic/Immunologic: Negative for environmental allergies.  Neurological: Negative for dizziness and headaches.  Hematological: Does not bruise/bleed easily.  Psychiatric/Behavioral: Negative for suicidal ideas. The patient is not nervous/anxious.     Patient Active Problem List   Diagnosis Date Noted  . Longstanding persistent atrial fibrillation (HCC) 06/09/2020  . 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 Essential hypertension 11/29/2017  . Chronic seasonal allergic rhinitis due to pollen 11/29/2017  . Cerebral vascular disease 11/29/2017  . Anticoagulated on Coumadin 02/23/2016    Allergies  Allergen  Reactions  . Latex     Past Surgical History:  Procedure Laterality Date  . HERNIA REPAIR      Social History   Tobacco Use  . Smoking status: Never Smoker  . Smokeless tobacco: Never Used  Substance Use Topics  . Alcohol use: No    Alcohol/week: 0.0 standard drinks  . Drug use: No     Medication list has been reviewed and updated.  Current Meds  Medication Sig  . atenolol (TENORMIN) 25 MG tablet Take 1 tablet (25 mg total) by mouth daily.  . cephALEXin (KEFLEX) 500 MG capsule Take 1 capsule (500 mg total) by mouth 3 (three) times daily.  . fluticasone (FLONASE) 50 MCG/ACT nasal spray Place 1 spray into both nostrils daily.  Marland Kitchen lisinopril (ZESTRIL) 40 MG tablet Take 1 tablet (40 mg total) by mouth daily.  . rosuvastatin (CRESTOR) 10 MG tablet TAKE 1 TABLET (10 MG TOTAL) BY MOUTH ONCE A WEEK.  . warfarin (COUMADIN) 1 MG tablet TAKE 1 TABLET AS DIRECTED (TAKE 4 MG ON MONDAY, WEDNESDAY, AND FRIDAY AND 3 MG THE REST OF THE WEEK)  . warfarin (COUMADIN) 3 MG tablet TAKE 1 TABLET DAILY OR AS DIRECTED  . warfarin (COUMADIN) 4 MG tablet TAKE 1 TABLET DAILY OR AS DIRECTED    PHQ 2/9 Scores 08/02/2020 06/09/2020 10/28/2019 10/10/2019  PHQ - 2 Score 0 0 0 0  PHQ- 9 Score 0 0 0 0    GAD 7 : Generalized Anxiety Score 08/02/2020 06/09/2020 10/28/2019 10/10/2019  Nervous, Anxious, on Edge 0 0 0 0  Control/stop worrying  0 0 0 0  Worry too much - different things 0 0 0 0  Trouble relaxing 0 0 0 0  Restless 0 0 0 0  Easily annoyed or irritable 0 0 0 0  Afraid - awful might happen 0 0 0 0  Total GAD 7 Score 0 0 0 0  Anxiety Difficulty Not difficult at all - - -    BP Readings from Last 3 Encounters:  08/16/20 120/70  08/05/20 120/60  08/02/20 130/72    Physical Exam Vitals and nursing note reviewed.  HENT:     Head: Normocephalic.     Right Ear: External ear normal.     Left Ear: External ear normal.     Mouth/Throat:     Mouth: Oropharynx is clear and moist.  Eyes:     General:  No scleral icterus.       Right eye: No discharge.        Left eye: No discharge.     Extraocular Movements: EOM normal.     Conjunctiva/sclera: Conjunctivae normal.     Pupils: Pupils are equal, round, and reactive to light.  Neck:     Thyroid: No thyromegaly.     Vascular: No JVD.     Trachea: No tracheal deviation.  Cardiovascular:     Rate and Rhythm: Normal rate and regular rhythm.     Pulses: Intact distal pulses.     Heart sounds: Normal heart sounds, S1 normal and S2 normal. No murmur heard.  No systolic murmur is present.  No diastolic murmur is present. No friction rub. No gallop. No S3 or S4 sounds.   Pulmonary:     Effort: No respiratory distress.     Breath sounds: Normal breath sounds. No wheezing, rhonchi or rales.  Abdominal:     General: Bowel sounds are normal.     Palpations: Abdomen is soft. There is no hepatosplenomegaly or mass.     Tenderness: There is no abdominal tenderness. There is no CVA tenderness, guarding or rebound.  Musculoskeletal:        General: No tenderness or edema. Normal range of motion.     Cervical back: Normal range of motion and neck supple.  Lymphadenopathy:     Cervical: No cervical adenopathy.  Skin:    General: Skin is warm.     Findings: Erythema and wound present. No rash.  Neurological:     General: No focal deficit present.     Mental Status: He is alert and oriented to person, place, and time.     Cranial Nerves: No cranial nerve deficit.     Deep Tendon Reflexes: Strength normal and reflexes are normal and symmetric.     Wt Readings from Last 3 Encounters:  08/16/20 205 lb (93 kg)  08/05/20 205 lb (93 kg)  08/02/20 209 lb (94.8 kg)    BP 120/70   Pulse 64   Ht 5\' 11"  (1.803 m)   Wt 205 lb (93 kg)   BMI 28.59 kg/m   Assessment and Plan: 1. Avulsion of skin of left lower leg, subsequent encounter Follow-up for evaluation of evaluation of left lower leg.  Area is healing well with good granulation with  wet-to-dry dressing.  There is some surrounding erythema with tenderness noted.  We will recheck patient in 1 week.  I have suggested that they use Hibiclens or pHisoDerm to cleanse the area prior to irrigation.  2. Cellulitis of left lower extremity New onset.  Patient has erythema  with tenderness surrounding the wound area.  However the wound itself is gradually granulating in.  We will switch over to Augmentin 875 mg twice a day for 10 days and recheck in 1 week. - amoxicillin-clavulanate (AUGMENTIN) 875-125 MG tablet; Take 1 tablet by mouth 2 (two) times daily.  Dispense: 20 tablet; Refill: 0  3. Anticoagulated on Coumadin Patient is anticoagulated on Coumadin and we will check a PT/INR at this time. - INR/PT

## 2020-08-17 LAB — PROTIME-INR
INR: 1.9 — ABNORMAL HIGH (ref 0.9–1.2)
Prothrombin Time: 19.9 s — ABNORMAL HIGH (ref 9.1–12.0)

## 2020-08-23 ENCOUNTER — Ambulatory Visit: Payer: Medicare Other | Admitting: Family Medicine

## 2020-08-25 ENCOUNTER — Other Ambulatory Visit: Payer: Self-pay

## 2020-08-25 ENCOUNTER — Encounter: Payer: Self-pay | Admitting: Family Medicine

## 2020-08-25 ENCOUNTER — Ambulatory Visit (INDEPENDENT_AMBULATORY_CARE_PROVIDER_SITE_OTHER): Payer: Medicare Other | Admitting: Family Medicine

## 2020-08-25 VITALS — BP 160/84 | HR 80 | Ht 71.0 in | Wt 205.0 lb

## 2020-08-25 DIAGNOSIS — S81802D Unspecified open wound, left lower leg, subsequent encounter: Secondary | ICD-10-CM | POA: Diagnosis not present

## 2020-08-25 NOTE — Progress Notes (Signed)
Date:  08/25/2020   Name:  [name]   DOB:  25-Dec-1945   MRN:  [id number]   Chief Complaint: Follow-up (Leg abrasion)  Patient is a 75 year old male who presents for a wound care recheck exam. The patient reports the following problems: gradually healing. Health maintenance has been reviewed up to date.   Lab Results  Component Value Date   CREATININE 1.04 06/09/2020   BUN 17 06/09/2020   NA 139 06/09/2020   K 4.5 06/09/2020   CL 99 06/09/2020   CO2 28 06/09/2020   Lab Results  Component Value Date   CHOL 190 07/12/2020   HDL 52 07/12/2020   LDLCALC 106 (H) 07/12/2020   TRIG 183 (H) 07/12/2020   CHOLHDL 4.0 10/10/2019   No results found for: TSH Lab Results  Component Value Date   HGBA1C 6.5 (H) 06/09/2020   No results found for: WBC, HGB, HCT, MCV, PLT No results found for: ALT, AST, GGT, ALKPHOS, BILITOT   Review of Systems  Constitutional: Negative for chills and fever.  HENT: Negative for drooling, ear discharge, ear pain and sore throat.   Respiratory: Negative for cough, shortness of breath and wheezing.   Cardiovascular: Negative for chest pain, palpitations and leg swelling.  Gastrointestinal: Negative for abdominal pain, blood in stool, constipation, diarrhea and nausea.  Endocrine: Negative for polydipsia.  Genitourinary: Negative for dysuria, frequency, hematuria and urgency.  Musculoskeletal: Negative for back pain, myalgias and neck pain.  Skin: Negative for rash.  Allergic/Immunologic: Negative for environmental allergies.  Neurological: Negative for dizziness and headaches.  Hematological: Does not bruise/bleed easily.  Psychiatric/Behavioral: Negative for suicidal ideas. The patient is not nervous/anxious.     Patient Active Problem List   Diagnosis Date Noted  . Longstanding persistent atrial fibrillation (HCC) 06/09/2020  . 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 Essential hypertension 11/29/2017  .  Chronic seasonal allergic rhinitis due to pollen 11/29/2017  . Cerebral vascular disease 11/29/2017  . Anticoagulated on Coumadin 02/23/2016    Allergies  Allergen Reactions  . Latex     Past Surgical History:  Procedure Laterality Date  . HERNIA REPAIR      Social History   Tobacco Use  . Smoking status: Never Smoker  . Smokeless tobacco: Never Used  Substance Use Topics  . Alcohol use: No    Alcohol/week: 0.0 standard drinks  . Drug use: No     Medication list has been reviewed and updated.  Current Meds  Medication Sig  . amoxicillin-clavulanate (AUGMENTIN) 875-125 MG tablet Take 1 tablet by mouth 2 (two) times daily.  Marland Kitchen atenolol (TENORMIN) 25 MG tablet Take 1 tablet (25 mg total) by mouth daily.  . fluticasone (FLONASE) 50 MCG/ACT nasal spray Place 1 spray into both nostrils daily.  Marland Kitchen lisinopril (ZESTRIL) 40 MG tablet Take 1 tablet (40 mg total) by mouth daily.  . rosuvastatin (CRESTOR) 10 MG tablet TAKE 1 TABLET (10 MG TOTAL) BY MOUTH ONCE A WEEK.  . warfarin (COUMADIN) 1 MG tablet TAKE 1 TABLET AS DIRECTED (TAKE 4 MG ON MONDAY, WEDNESDAY, AND FRIDAY AND 3 MG THE REST OF THE WEEK)  . warfarin (COUMADIN) 3 MG tablet TAKE 1 TABLET DAILY OR AS DIRECTED  . warfarin (COUMADIN) 4 MG tablet TAKE 1 TABLET DAILY OR AS DIRECTED  . [DISCONTINUED] cephALEXin (KEFLEX) 500 MG capsule Take 1 capsule (500 mg total) by mouth 3 (three) times daily.    PHQ 2/9 Scores 08/02/2020 06/09/2020  10/28/2019 10/10/2019  PHQ - 2 Score 0 0 0 0  PHQ- 9 Score 0 0 0 0    GAD 7 : Generalized Anxiety Score 08/02/2020 06/09/2020 10/28/2019 10/10/2019  Nervous, Anxious, on Edge 0 0 0 0  Control/stop worrying 0 0 0 0  Worry too much - different things 0 0 0 0  Trouble relaxing 0 0 0 0  Restless 0 0 0 0  Easily annoyed or irritable 0 0 0 0  Afraid - awful might happen 0 0 0 0  Total GAD 7 Score 0 0 0 0  Anxiety Difficulty Not difficult at all - - -    BP Readings from Last 3 Encounters:  08/25/20  (!) 160/84  08/16/20 120/70  08/05/20 120/60    Physical Exam Vitals and nursing note reviewed.  HENT:     Head: Normocephalic.     Right Ear: Tympanic membrane, ear canal and external ear normal.     Left Ear: Tympanic membrane, ear canal and external ear normal.     Nose: Nose normal. No congestion or rhinorrhea.     Mouth/Throat:     Mouth: Oropharynx is clear and moist.  Eyes:     General: No scleral icterus.       Right eye: No discharge.        Left eye: No discharge.     Extraocular Movements: EOM normal.     Conjunctiva/sclera: Conjunctivae normal.     Pupils: Pupils are equal, round, and reactive to light.  Neck:     Thyroid: No thyromegaly.     Vascular: No JVD.     Trachea: No tracheal deviation.  Cardiovascular:     Rate and Rhythm: Normal rate and regular rhythm.     Pulses: Intact distal pulses.     Heart sounds: Normal heart sounds. No murmur heard. No friction rub. No gallop.   Pulmonary:     Effort: No respiratory distress.     Breath sounds: Normal breath sounds. No wheezing or rales.  Abdominal:     General: Bowel sounds are normal.     Palpations: Abdomen is soft. There is no hepatosplenomegaly or mass.     Tenderness: There is no abdominal tenderness. There is no CVA tenderness, guarding or rebound.  Musculoskeletal:        General: No tenderness or edema. Normal range of motion.     Cervical back: Normal range of motion and neck supple.  Lymphadenopathy:     Cervical: No cervical adenopathy.  Skin:    General: Skin is warm.     Findings: No erythema or rash.     Comments: Gradual epithelialization without surrounding erythema.  Neurological:     Mental Status: He is alert and oriented to person, place, and time.     Cranial Nerves: No cranial nerve deficit.     Deep Tendon Reflexes: Strength normal and reflexes are normal and symmetric.     Wt Readings from Last 3 Encounters:  08/25/20 205 lb (93 kg)  08/16/20 205 lb (93 kg)  08/05/20 205  lb (93 kg)    BP (!) 160/84   Pulse 80   Ht 5\' 11"  (1.803 m)   Wt 205 lb (93 kg)   BMI 28.59 kg/m   Assessment and Plan: 1. Avulsion of skin of left lower leg, subsequent encounter Patient returns for reevaluation of a pulse of laceration of the left lower leg.  This is healing gradually with reepithelialization noted on examination.  We have sorted count is stuck in the general area size of the wound not receding therefore we will refer to wound care clinic for further evaluation.  In the meantime patient's been instructed to continue wet-to-dry dressings. - AMB referral to wound care center

## 2020-08-30 ENCOUNTER — Other Ambulatory Visit: Payer: Self-pay

## 2020-08-30 ENCOUNTER — Encounter: Payer: Medicare Other | Attending: Physician Assistant | Admitting: Physician Assistant

## 2020-08-30 DIAGNOSIS — I872 Venous insufficiency (chronic) (peripheral): Secondary | ICD-10-CM | POA: Diagnosis not present

## 2020-08-30 DIAGNOSIS — L97822 Non-pressure chronic ulcer of other part of left lower leg with fat layer exposed: Secondary | ICD-10-CM | POA: Diagnosis not present

## 2020-08-30 DIAGNOSIS — Z8673 Personal history of transient ischemic attack (TIA), and cerebral infarction without residual deficits: Secondary | ICD-10-CM | POA: Diagnosis not present

## 2020-08-30 DIAGNOSIS — I10 Essential (primary) hypertension: Secondary | ICD-10-CM | POA: Diagnosis not present

## 2020-08-30 NOTE — Progress Notes (Signed)
MARKON, JARES ([id number]) Visit Report for 08/30/2020 Abuse/Suicide Risk Screen Details Patient Name: [name], [name] Date of Service: 08/30/2020 3:00 PM Medical Record Number: [id number] Patient Account Number: [id number] Date of Birth/Sex: 1946/03/06 (75 y.o. Male) Treating RN: Rogers Blocker Primary Care Mir Fullilove: Elizabeth Sauer Other Clinician: Referring Cyniah Gossard: Elizabeth Sauer Treating Daphney Hopke/Extender: Allen Derry Weeks in Treatment: 0 Abuse/Suicide Risk Screen Items Answer ABUSE RISK SCREEN: Has anyone close to you tried to hurt or harm you recentlyo No Do you feel uncomfortable with anyone in your familyo No Has anyone forced you do things that you didnot want to doo No Electronic Signature(s) Signed: 08/30/2020 4:33:18 PM By: Phillis Haggis, Dondra Prader RN Entered By: Phillis Haggis, Dondra Prader on 08/30/2020 15:31:14 [name] ([id number]) -------------------------------------------------------------------------------- Activities of Daily Living Details Patient Name: [name] Date of Service: 08/30/2020 3:00 PM Medical Record Number: [id number] Patient Account Number: [id number] Date of Birth/Sex: 08-08-1945 (75 y.o. Male) Treating RN: Rogers Blocker Primary Care Zyonna Vardaman: Elizabeth Sauer Other Clinician: Referring Eloise Picone: Elizabeth Sauer Treating Erikah Thumm/Extender: Allen Derry Weeks in Treatment: 0 Activities of Daily Living Items Answer Activities of Daily Living (Please select one for each item) Drive Automobile Completely Able Take Medications Completely Able Use Telephone Completely Able Care for Appearance Completely Able Use Toilet Completely Able Bath / Shower Completely Able Dress Self Completely Able Feed Self Completely Able Walk Completely Able Get In / Out Bed Completely Able Housework Completely Able Prepare Meals Completely Able Handle Money Completely Able Shop for Self Completely Able Electronic Signature(s) Signed: 08/30/2020 4:33:18 PM By:  Phillis Haggis, Dondra Prader RN Entered By: Phillis Haggis, Kenia on 08/30/2020 15:31:36 [name] ([id number]) -------------------------------------------------------------------------------- Education Screening Details Patient Name: [name] Date of Service: 08/30/2020 3:00 PM Medical Record Number: [id number] Patient Account Number: [id number] Date of Birth/Sex: July 09, 1946 (75 y.o. Male) Treating RN: Rogers Blocker Primary Care Dimitra Woodstock: Elizabeth Sauer Other Clinician: Referring Kenetha Cozza: Elizabeth Sauer Treating Jeanenne Licea/Extender: Rowan Blase in Treatment: 0 Primary Learner Assessed: Patient Learning Preferences/Education Level/Primary Language Learning Preference: Explanation, Demonstration Highest Education Level: College or Above Preferred Language: English Cognitive Barrier Language Barrier: No Translator Needed: No Memory Deficit: No Emotional Barrier: No Cultural/Religious Beliefs Affecting Medical Care: No Physical Barrier Impaired Vision: Yes Glasses Impaired Hearing: No Decreased Hand dexterity: No Knowledge/Comprehension Knowledge Level: High Comprehension Level: High Ability to understand written instructions: High Ability to understand verbal instructions: High Motivation Anxiety Level: Calm Cooperation: Cooperative Education Importance: Acknowledges Need Interest in Health Problems: Asks Questions Perception: Coherent Willingness to Engage in Self-Management High Activities: Readiness to Engage in Self-Management High Activities: Electronic Signature(s) Signed: 08/30/2020 4:33:18 PM By: Phillis Haggis, Dondra Prader RN Entered By: Phillis Haggis, Dondra Prader on 08/30/2020 15:32:12 [name] ([id number]) -------------------------------------------------------------------------------- Fall Risk Assessment Details Patient Name: [name] Date of Service: 08/30/2020 3:00 PM Medical Record Number: [id number] Patient Account Number: [id number] Date  of Birth/Sex: Apr 19, 1946 (75 y.o. Male) Treating RN: Rogers Blocker Primary Care Delfin Squillace: Elizabeth Sauer Other Clinician: Referring Paulyne Mooty: Elizabeth Sauer Treating Kaylaann Mountz/Extender: Allen Derry Weeks in Treatment: 0 Fall Risk Assessment Items Have you had 2 or more falls in the last 12 monthso 0 No Have you had any fall that resulted in injury in the last 12 monthso 0 No FALLS RISK SCREEN History of falling - immediate or within 3 months 0 No Secondary diagnosis (Do you have 2 or more medical diagnoseso) 0 No Ambulatory aid None/bed rest/wheelchair/nurse 0 No Crutches/cane/walker 15 Yes Furniture 0 No Intravenous therapy Access/Saline/Heparin Lock 0 No Gait/Transferring Normal/ bed rest/ wheelchair 0 Yes Weak (short steps with  or without shuffle, stooped but able to lift head while walking, may 0 No seek support from furniture) Impaired (short steps with shuffle, may have difficulty arising from chair, head down, impaired 0 No balance) Mental Status Oriented to own ability 0 Yes Electronic Signature(s) Signed: 08/30/2020 4:33:18 PM By: Phillis Haggis, Dondra Prader RN Entered By: Phillis Haggis, Dondra Prader on 08/30/2020 15:32:50 [name] ([id number]) -------------------------------------------------------------------------------- Foot Assessment Details Patient Name: [name] Date of Service: 08/30/2020 3:00 PM Medical Record Number: [id number] Patient Account Number: [id number] Date of Birth/Sex: 02-02-1946 (75 y.o. Male) Treating RN: Rogers Blocker Primary Care Alfonse Garringer: Elizabeth Sauer Other Clinician: Referring Margel Joens: Elizabeth Sauer Treating Mardee Clune/Extender: Allen Derry Weeks in Treatment: 0 Foot Assessment Items Site Locations + = Sensation present, - = Sensation absent, C = Callus, U = Ulcer R = Redness, W = Warmth, M = Maceration, PU = Pre-ulcerative lesion F = Fissure, S = Swelling, D = Dryness Assessment Right: Left: Other Deformity: No No Prior Foot Ulcer:  No No Prior Amputation: No No Charcot Joint: No No Ambulatory Status: Ambulatory With Help Assistance Device: Cane Gait: Steady Electronic Signature(s) Signed: 08/30/2020 4:33:18 PM By: Phillis Haggis, Dondra Prader RN Entered By: Phillis Haggis, Dondra Prader on 08/30/2020 15:35:41 [name] ([id number]) -------------------------------------------------------------------------------- Nutrition Risk Screening Details Patient Name: [name] Date of Service: 08/30/2020 3:00 PM Medical Record Number: [id number] Patient Account Number: [id number] Date of Birth/Sex: 03-30-1946 (75 y.o. Male) Treating RN: Rogers Blocker Primary Care Keiona Jenison: Elizabeth Sauer Other Clinician: Referring Zaydee Aina: Elizabeth Sauer Treating Leilynn Pilat/Extender: Allen Derry Weeks in Treatment: 0 Height (in): 71 Weight (lbs): 205 Body Mass Index (BMI): 28.6 Nutrition Risk Screening Items Score Screening NUTRITION RISK SCREEN: I have an illness or condition that made me change the kind and/or amount of food I eat 0 No I eat fewer than two meals per day 0 No I eat few fruits and vegetables, or milk products 0 No I have three or more drinks of beer, liquor or wine almost every day 0 No I have tooth or mouth problems that make it hard for me to eat 0 No I don't always have enough money to buy the food I need 0 No I eat alone most of the time 0 No I take three or more different prescribed or over-the-counter drugs a day 0 No Without wanting to, I have lost or gained 10 pounds in the last six months 0 No I am not always physically able to shop, cook and/or feed myself 0 No Nutrition Protocols Good Risk Protocol 0 No interventions needed Moderate Risk Protocol High Risk Proctocol Risk Level: Good Risk Score: 0 Electronic Signature(s) Signed: 08/30/2020 4:33:18 PM By: Phillis Haggis, Dondra Prader RN Entered By: Phillis Haggis, Dondra Prader on 08/30/2020 15:33:00

## 2020-08-30 NOTE — Progress Notes (Signed)
KASON, BENAK ([id number]) Visit Report for 08/30/2020 Chief Complaint Document Details Patient Name: [name], [name] Date of Service: 08/30/2020 3:00 PM Medical Record Number: [id number] Patient Account Number: [id number] Date of Birth/Sex: [birth year] (75 y.o. Male) Treating RN: Primary Care Provider: Otilio Miu Other Clinician: Referring Provider: Otilio Miu Treating Provider/Extender: Skipper Cliche in Treatment: 0 Information Obtained from: Patient Chief Complaint Left LE Ulcer Electronic Signature(s) Signed: 08/30/2020 4:02:52 PM By: Worthy Keeler PA-C Entered By: Worthy Keeler on 08/30/2020 16:02:52 [name] ([id number]) -------------------------------------------------------------------------------- Debridement Details Patient Name: [name] Date of Service: 08/30/2020 3:00 PM Medical Record Number: [id number] Patient Account Number: [id number] Date of Birth/Sex: 28-Sep-1945 (74 y.o. Male) Treating RN: Dolan Amen Primary Care Provider: Otilio Miu Other Clinician: Referring Provider: Otilio Miu Treating Provider/Extender: Jeri Cos Weeks in Treatment: 0 Debridement Performed for Wound #1 Left,Lateral Lower Leg Assessment: Performed By: Physician Tommie Sams., PA-C Debridement Type: Debridement Level of Consciousness (Pre- Awake and Alert procedure): Pre-procedure Verification/Time Out Yes - 16:06 Taken: Start Time: 16:06 Pain Control: Lidocaine 4% Topical Solution Total Area Debrided (L x W): 2.5 (cm) x 4.1 (cm) = 10.25 (cm) Tissue and other material Viable, Non-Viable, Slough, Subcutaneous, Slough debrided: Level: Skin/Subcutaneous Tissue Debridement Description: Excisional Instrument: Curette Bleeding: Minimum Hemostasis Achieved: Pressure End Time: 16:09 Procedural Pain: 0 Post Procedural Pain: 0 Response to Treatment: Procedure was tolerated well Level of Consciousness (Post- Awake and Alert procedure): Post  Debridement Measurements of Total Wound Length: (cm) 2.5 Width: (cm) 4.1 Depth: (cm) 0.2 Volume: (cm) 1.61 Character of Wound/Ulcer Post Debridement: Stable Post Procedure Diagnosis Same as Pre-procedure Electronic Signature(s) Signed: 08/30/2020 4:33:18 PM By: Georges Mouse, Minus Breeding RN Signed: 08/30/2020 5:09:34 PM By: Worthy Keeler PA-C Entered By: Georges Mouse, Minus Breeding on 08/30/2020 16:09:35 [name] ([id number]) -------------------------------------------------------------------------------- HPI Details Patient Name: [name] Date of Service: 08/30/2020 3:00 PM Medical Record Number: [id number] Patient Account Number: [id number] Date of Birth/Sex: April 22, 1946 (75 y.o. Male) Treating RN: Primary Care Provider: Otilio Miu Other Clinician: Referring Provider: Otilio Miu Treating Provider/Extender: Jeri Cos Weeks in Treatment: 0 History of Present Illness HPI Description: 08/30/2020 upon evaluation today patient presents today for inspection regarding a wound on his left lower extremity. He does have evidence of some chronic venous insufficiency and hemosiderin staining. With that being said he tells me this began after his stroke though he does not appear to be too swollen today this is good news. Fortunately there is no evidence of active infection at this time. No fevers, chills, nausea, vomiting, or diarrhea. He does have a history of hypertension and occasional elevated blood sugar levels although he does not have a formal diagnosis of diabetes according to what he tells me today. Electronic Signature(s) Signed: 08/30/2020 4:50:47 PM By: Worthy Keeler PA-C Entered By: Worthy Keeler on 08/30/2020 16:50:47 [name] ([id number]) -------------------------------------------------------------------------------- Physical Exam Details Patient Name: [name] Date of Service: 08/30/2020 3:00 PM Medical Record Number: [id number] Patient Account Number:  [id number] Date of Birth/Sex: January 28, 1946 (75 y.o. Male) Treating RN: Primary Care Provider: Otilio Miu Other Clinician: Referring Provider: Otilio Miu Treating Provider/Extender: Jeri Cos Weeks in Treatment: 0 Constitutional patient is hypertensive.. pulse regular and within target range for patient.Marland Kitchen respirations regular, non-labored and within target range for patient.Marland Kitchen temperature within target range for patient.. Well-nourished and well-hydrated in no acute distress. Eyes conjunctiva clear no eyelid edema noted. pupils equal round and reactive to light and accommodation. Ears, Nose, Mouth, and Throat no gross abnormality of ear auricles or external  auditory canals. normal hearing noted during conversation. mucus membranes moist. Respiratory normal breathing without difficulty. Cardiovascular 2+ dorsalis pedis/posterior tibialis pulses. trace pitting edema of the bilateral lower extremities. Musculoskeletal normal gait and posture. no significant deformity or arthritic changes, no loss or range of motion, no clubbing. Psychiatric this patient is able to make decisions and demonstrates good insight into disease process. Alert and Oriented x 3. pleasant and cooperative. Notes Upon inspection patient's wound bed actually showed signs of some good granulation at this time. There was also some slough and biofilm buildup on the surface of wound which did require sharp debridement today. The patient actually tolerated the debridement without any significant pain he states the numbing medication really did help. Post debridement wound bed appears to be excellent there were a couple areas of hyper granulation for that reason we are going to go ahead with Hydrofera Blue was the dressing of choice today. Electronic Signature(s) Signed: 08/30/2020 4:51:30 PM By: Worthy Keeler PA-C Entered By: Worthy Keeler on 08/30/2020 16:51:30 [name]  ([id number]) -------------------------------------------------------------------------------- Physician Orders Details Patient Name: [name] Date of Service: 08/30/2020 3:00 PM Medical Record Number: [id number] Patient Account Number: [id number] Date of Birth/Sex: 1946-06-15 (75 y.o. Male) Treating RN: Dolan Amen Primary Care Provider: Otilio Miu Other Clinician: Referring Provider: Otilio Miu Treating Provider/Extender: Skipper Cliche in Treatment: 0 Verbal / Phone Orders: No Diagnosis Coding ICD-10 Coding Code Description I87.2 Venous insufficiency (chronic) (peripheral) L97.822 Non-pressure chronic ulcer of other part of left lower leg with fat layer exposed [place] (primary) hypertension R73.9 Hyperglycemia, unspecified Follow-up Appointments o Return Appointment in 1 week. Bathing/ Shower/ Hygiene o Clean wound with Normal Saline or wound cleanser. Edema Control - Lymphedema / Segmental Compressive Device / Other Wound #1 Left,Lateral Lower Leg o Elevate legs to the level of the heart and pump ankles as often as possible o Elevate leg(s) parallel to the floor when sitting. o Other: - Tubi grip E Wound Treatment Wound #1 - Lower Leg Wound Laterality: Left, Lateral Cleanser: Byram Ancillary Kit - 15 Day Supply (DME) (Generic) 3 x Per Week/30 Days Discharge Instructions: Use supplies as instructed; Kit contains: (15) Saline Bullets; (15) 3x3 Gauze; 15 pr Gloves Cleanser: Normal Saline (Generic) 3 x Per Week/30 Days Discharge Instructions: Wash your hands with soap and water. Remove old dressing, discard into plastic bag and place into trash. Cleanse the wound with Normal Saline prior to applying a clean dressing using gauze sponges, not tissues or cotton balls. Do not scrub or use excessive force. Pat dry using gauze sponges, not tissue or cotton balls. Primary Dressing: Hydrofera Blue Ready Transfer Foam, 2.5x2.5 (in/in) (DME) (Generic) 3 x Per  Week/30 Days Discharge Instructions: Apply Hydrofera Blue Ready to wound bed as directed Secondary Dressing: Conforming Guaze Roll-Large (DME) (Generic) 3 x Per Week/30 Days Discharge Instructions: Apply Conforming Stretch Guaze Bandage as directed Secured With: Tubigrip Size E, 3.5x10 (in/yds) (Generic) 3 x Per Week/30 Days Discharge Instructions: Apply 3 Tubigrip E 3-finger-widths below knee to base of toes to secure dressing and/or for swelling. Laboratory o Bacteria identified in Wound by Culture (MICRO) oooo LOINC Code: 2423-5 oooo Convenience Name: Wound culture routine Electronic Signature(s) Signed: 08/30/2020 4:33:18 PM By: Georges Mouse, Minus Breeding RN Signed: 08/30/2020 5:09:34 PM By: Worthy Keeler PA-C Entered By: Georges Mouse, Minus Breeding on 08/30/2020 16:27:10 [name] ([id number]) RAYMOND, BHARDWAJ ([id number]) -------------------------------------------------------------------------------- Problem List Details Patient Name: [name] Date of Service: 08/30/2020 3:00 PM Medical Record Number: [id number] Patient Account  Number: [id number] Date of Birth/Sex: 1946/02/03 (75 y.o. Male) Treating RN: Primary Care Provider: Otilio Miu Other Clinician: Referring Provider: Otilio Miu Treating Provider/Extender: Skipper Cliche in Treatment: 0 Active Problems ICD-10 Encounter Code Description Active Date MDM Diagnosis I87.2 Venous insufficiency (chronic) (peripheral) 08/30/2020 No Yes L97.822 Non-pressure chronic ulcer of other part of left lower leg with fat layer 08/30/2020 No Yes exposed [place] (primary) hypertension 08/30/2020 No Yes R73.9 Hyperglycemia, unspecified 08/30/2020 No Yes Inactive Problems Resolved Problems Electronic Signature(s) Signed: 08/30/2020 4:02:34 PM By: Worthy Keeler PA-C Entered By: Worthy Keeler on 08/30/2020 16:02:34 [name]  ([id number]) -------------------------------------------------------------------------------- Progress Note Details Patient Name: [name] Date of Service: 08/30/2020 3:00 PM Medical Record Number: [id number] Patient Account Number: [id number] Date of Birth/Sex: 07-05-1946 (75 y.o. Male) Treating RN: Primary Care Provider: Otilio Miu Other Clinician: Referring Provider: Otilio Miu Treating Provider/Extender: Jeri Cos Weeks in Treatment: 0 Subjective Chief Complaint Information obtained from Patient Left LE Ulcer History of Present Illness (HPI) 08/30/2020 upon evaluation today patient presents today for inspection regarding a wound on his left lower extremity. He does have evidence of some chronic venous insufficiency and hemosiderin staining. With that being said he tells me this began after his stroke though he does not appear to be too swollen today this is good news. Fortunately there is no evidence of active infection at this time. No fevers, chills, nausea, vomiting, or diarrhea. He does have a history of hypertension and occasional elevated blood sugar levels although he does not have a formal diagnosis of diabetes according to what he tells me today. Patient History Information obtained from Patient. Allergies latex Social History Never smoker, Marital Status - Widowed, Alcohol Use - Rarely, Drug Use - No History, Caffeine Use - Moderate - coffee. Medical History Eyes Denies history of Cataracts, Glaucoma Ear/Nose/Mouth/Throat Denies history of Chronic sinus problems/congestion, Middle ear problems Hematologic/Lymphatic Patient has history of Human Immunodeficiency Virus Denies history of Anemia, Hemophilia, Lymphedema, Sickle Cell Disease Respiratory Denies history of Aspiration, Asthma, Chronic Obstructive Pulmonary Disease (COPD), Pneumothorax, Sleep Apnea, Tuberculosis Cardiovascular Patient has history of Hypertension Denies history of Angina,  Arrhythmia, Congestive Heart Failure, Deep Vein Thrombosis, Hypotension, Myocardial Infarction, Peripheral Arterial Disease, Peripheral Venous Disease, Phlebitis, Vasculitis Gastrointestinal Denies history of Cirrhosis , Colitis, Crohn s, Hepatitis A, Hepatitis B, Hepatitis C Endocrine Denies history of Type I Diabetes, Type II Diabetes Genitourinary Denies history of End Stage Renal Disease Immunological Denies history of Lupus Erythematosus, Raynaud s, Scleroderma Integumentary (Skin) Denies history of History of Burn, History of pressure wounds Musculoskeletal Denies history of Gout, Rheumatoid Arthritis, Osteoarthritis, Osteomyelitis Neurologic Denies history of Dementia, Neuropathy, Quadriplegia, Paraplegia, Seizure Disorder Psychiatric Denies history of Anorexia/bulimia, Confinement Anxiety Hospitalization/Surgery History - hernia repair. Medical And Surgical History Notes Cardiovascular CVA 1996 Review of Systems (ROS) Constitutional Symptoms (General Health) Denies complaints or symptoms of Fatigue, Fever, Chills, Marked Weight Change. Eyes Complains or has symptoms of Glasses / Contacts. Denies complaints or symptoms of Dry Eyes, Vision Changes. IREN, WHIPP ([id number]) Ear/Nose/Mouth/Throat Denies complaints or symptoms of Difficult clearing ears, Sinusitis. Hematologic/Lymphatic Complains or has symptoms of Bleeding / Clotting Disorders. Denies complaints or symptoms of Human Immunodeficiency Virus. Respiratory Denies complaints or symptoms of Chronic or frequent coughs, Shortness of Breath. Cardiovascular Denies complaints or symptoms of Chest pain, LE edema. Gastrointestinal Denies complaints or symptoms of Frequent diarrhea, Nausea, Vomiting. Endocrine Denies complaints or symptoms of Hepatitis, Thyroid disease, Polydypsia (Excessive Thirst). Genitourinary Denies complaints or symptoms of Kidney failure/ Dialysis,  Incontinence/dribbling. Immunological Denies complaints or symptoms of Hives, Itching. Integumentary (Skin) Complains or has symptoms of Wounds, Bleeding or bruising tendency. Denies complaints or symptoms of Breakdown, Swelling. Musculoskeletal Denies complaints or symptoms of Muscle Pain, Muscle Weakness. Neurologic Denies complaints or symptoms of Numbness/parasthesias, Focal/Weakness. Psychiatric Denies complaints or symptoms of Anxiety, Claustrophobia. Objective Constitutional patient is hypertensive.. pulse regular and within target range for patient.Marland Kitchen respirations regular, non-labored and within target range for patient.Marland Kitchen temperature within target range for patient.. Well-nourished and well-hydrated in no acute distress. Vitals Time Taken: 3:15 PM, Height: 71 in, Source: Stated, Weight: 205 lbs, Source: Stated, BMI: 28.6, Temperature: 97.8 F, Pulse: 76 bpm, Respiratory Rate: 18 breaths/min, Blood Pressure: 151/75 mmHg. Eyes conjunctiva clear no eyelid edema noted. pupils equal round and reactive to light and accommodation. Ears, Nose, Mouth, and Throat no gross abnormality of ear auricles or external auditory canals. normal hearing noted during conversation. mucus membranes moist. Respiratory normal breathing without difficulty. Cardiovascular 2+ dorsalis pedis/posterior tibialis pulses. trace pitting edema of the bilateral lower extremities. Musculoskeletal normal gait and posture. no significant deformity or arthritic changes, no loss or range of motion, no clubbing. Psychiatric this patient is able to make decisions and demonstrates good insight into disease process. Alert and Oriented x 3. pleasant and cooperative. General Notes: Upon inspection patient's wound bed actually showed signs of some good granulation at this time. There was also some slough and biofilm buildup on the surface of wound which did require sharp debridement today. The patient actually tolerated the  debridement without any significant pain he states the numbing medication really did help. Post debridement wound bed appears to be excellent there were a couple areas of hyper granulation for that reason we are going to go ahead with Hydrofera Blue was the dressing of choice today. Integumentary (Hair, Skin) Wound #1 status is Open. Original cause of wound was Skin Tear/Laceration. The wound is located on the Left,Lateral Lower Leg. The wound measures 2.5cm length x 4.1cm width x 0.1cm depth; 8.05cm^2 area and 0.805cm^3 volume. There is Fat Layer (Subcutaneous Tissue) exposed. There is no tunneling or undermining noted. There is a medium amount of serosanguineous drainage noted. There is medium (34-66%) red granulation within the wound bed. There is a medium (34-66%) amount of necrotic tissue within the wound bed including Adherent Slough. DESMOND, SZABO ([id number]) Assessment Active Problems ICD-10 Venous insufficiency (chronic) (peripheral) Non-pressure chronic ulcer of other part of left lower leg with fat layer exposed Essential (primary) hypertension Hyperglycemia, unspecified Procedures Wound #1 Pre-procedure diagnosis of Wound #1 is a Trauma, Other located on the Left,Lateral Lower Leg . There was a Excisional Skin/Subcutaneous Tissue Debridement with a total area of 10.25 sq cm performed by Tommie Sams., PA-C. With the following instrument(s): Curette to remove Viable and Non-Viable tissue/material. Material removed includes Subcutaneous Tissue and Slough and after achieving pain control using Lidocaine 4% Topical Solution. A time out was conducted at 16:06, prior to the start of the procedure. A Minimum amount of bleeding was controlled with Pressure. The procedure was tolerated well with a pain level of 0 throughout and a pain level of 0 following the procedure. Post Debridement Measurements: 2.5cm length x 4.1cm width x 0.2cm depth; 1.61cm^3 volume. Character of Wound/Ulcer  Post Debridement is stable. Post procedure Diagnosis Wound #1: Same as Pre-Procedure Plan Follow-up Appointments: Return Appointment in 1 week. Bathing/ Shower/ Hygiene: Clean wound with Normal Saline or wound cleanser. Edema Control - Lymphedema / Segmental Compressive Device / Other: Wound #1  Left,Lateral Lower Leg: Elevate legs to the level of the heart and pump ankles as often as possible Elevate leg(s) parallel to the floor when sitting. Other: - Tubi grip E Laboratory ordered were: Wound culture routine WOUND #1: - Lower Leg Wound Laterality: Left, Lateral Cleanser: Byram Ancillary Kit - 15 Day Supply (DME) (Generic) 3 x Per Week/30 Days Discharge Instructions: Use supplies as instructed; Kit contains: (15) Saline Bullets; (15) 3x3 Gauze; 15 pr Gloves Cleanser: Normal Saline (Generic) 3 x Per Week/30 Days Discharge Instructions: Wash your hands with soap and water. Remove old dressing, discard into plastic bag and place into trash. Cleanse the wound with Normal Saline prior to applying a clean dressing using gauze sponges, not tissues or cotton balls. Do not scrub or use excessive force. Pat dry using gauze sponges, not tissue or cotton balls. Primary Dressing: Hydrofera Blue Ready Transfer Foam, 2.5x2.5 (in/in) (DME) (Generic) 3 x Per Week/30 Days Discharge Instructions: Apply Hydrofera Blue Ready to wound bed as directed Secondary Dressing: Conforming Guaze Roll-Large (DME) (Generic) 3 x Per Week/30 Days Discharge Instructions: Apply Conforming Stretch Guaze Bandage as directed Secured With: Tubigrip Size E, 3.5x10 (in/yds) (Generic) 3 x Per Week/30 Days Discharge Instructions: Apply 3 Tubigrip E 3-finger-widths below knee to base of toes to secure dressing and/or for swelling. 1. We will get initiate treatment with the [facility] dressing I think that still the best way to go at this time. 2. I am also going to suggest that the patient go ahead and continue with the  elevation when he is sitting to try to help with edema control. We will use Tubigrip although I do not think he really needs a compression wrap at this point that changes in the future we will address that as necessary. 3. I would also recommend that we go ahead and obtain a wound culture post debridement and the patient tolerated that without complication. We will see what this shows if we need to initiate any antibiotics we will do so at that point when I get the results but for now no antibiotics were placed currently. We will see patient back for reevaluation in 1 week here in the clinic. If anything worsens or changes patient will contact our office for additional recommendations. Electronic Signature(s) CASSIDY, TABET ([id number]) Signed: 08/30/2020 4:52:23 PM By: Worthy Keeler PA-C Entered By: Worthy Keeler on 08/30/2020 16:52:23 [name] ([id number]) -------------------------------------------------------------------------------- ROS/PFSH Details Patient Name: [name] Date of Service: 08/30/2020 3:00 PM Medical Record Number: [id number] Patient Account Number: [id number] Date of Birth/Sex: 30-Dec-1945 (74 y.o. Male) Treating RN: Dolan Amen Primary Care Provider: Otilio Miu Other Clinician: Referring Provider: Otilio Miu Treating Provider/Extender: Jeri Cos Weeks in Treatment: 0 Information Obtained From Patient Constitutional Symptoms (General Health) Complaints and Symptoms: Negative for: Fatigue; Fever; Chills; Marked Weight Change Eyes Complaints and Symptoms: Positive for: Glasses / Contacts Negative for: Dry Eyes; Vision Changes Medical History: Negative for: Cataracts; Glaucoma Ear/Nose/Mouth/Throat Complaints and Symptoms: Negative for: Difficult clearing ears; Sinusitis Medical History: Negative for: Chronic sinus problems/congestion; Middle ear problems Hematologic/Lymphatic Complaints and Symptoms: Positive for: Bleeding / Clotting  Disorders Negative for: Human Immunodeficiency Virus Medical History: Positive for: Human Immunodeficiency Virus Negative for: Anemia; Hemophilia; Lymphedema; Sickle Cell Disease Respiratory Complaints and Symptoms: Negative for: Chronic or frequent coughs; Shortness of Breath Medical History: Negative for: Aspiration; Asthma; Chronic Obstructive Pulmonary Disease (COPD); Pneumothorax; Sleep Apnea; Tuberculosis Cardiovascular Complaints and Symptoms: Negative for: Chest pain; LE edema Medical History: Positive for: Hypertension Negative for:  Angina; Arrhythmia; Congestive Heart Failure; Deep Vein Thrombosis; Hypotension; Myocardial Infarction; Peripheral Arterial Disease; Peripheral Venous Disease; Phlebitis; Vasculitis Past Medical History Notes: CVA 1996 Gastrointestinal Complaints and Symptoms: Negative for: Frequent diarrhea; Nausea; Vomiting Medical HistoryJURRELL, ROYSTER ([id number]) Negative for: Cirrhosis ; Colitis; Crohnos; Hepatitis A; Hepatitis B; Hepatitis C Endocrine Complaints and Symptoms: Negative for: Hepatitis; Thyroid disease; Polydypsia (Excessive Thirst) Medical History: Negative for: Type I Diabetes; Type II Diabetes Genitourinary Complaints and Symptoms: Negative for: Kidney failure/ Dialysis; Incontinence/dribbling Medical History: Negative for: End Stage Renal Disease Immunological Complaints and Symptoms: Negative for: Hives; Itching Medical History: Negative for: Lupus Erythematosus; Raynaudos; Scleroderma Integumentary (Skin) Complaints and Symptoms: Positive for: Wounds; Bleeding or bruising tendency Negative for: Breakdown; Swelling Medical History: Negative for: History of Burn; History of pressure wounds Musculoskeletal Complaints and Symptoms: Negative for: Muscle Pain; Muscle Weakness Medical History: Negative for: Gout; Rheumatoid Arthritis; Osteoarthritis; Osteomyelitis Neurologic Complaints and Symptoms: Negative for:  Numbness/parasthesias; Focal/Weakness Medical History: Negative for: Dementia; Neuropathy; Quadriplegia; Paraplegia; Seizure Disorder Psychiatric Complaints and Symptoms: Negative for: Anxiety; Claustrophobia Medical History: Negative for: Anorexia/bulimia; Confinement Anxiety Oncologic Immunizations Pneumococcal Vaccine: Received Pneumococcal Vaccination: Yes Implantable Devices None GARRETH, BURNSWORTH ([id number]) Hospitalization / Surgery History Type of Hospitalization/Surgery hernia repair Family and Social History Never smoker; Marital Status - Widowed; Alcohol Use: Rarely; Drug Use: No History; Caffeine Use: Moderate - coffee Electronic Signature(s) Signed: 08/30/2020 4:33:18 PM By: Georges Mouse, Minus Breeding RN Signed: 08/30/2020 5:09:34 PM By: Worthy Keeler PA-C Entered By: Georges Mouse, Minus Breeding on 08/30/2020 15:31:08 [name] ([id number]) -------------------------------------------------------------------------------- [place] Details Patient Name: [name] Date of Service: 08/30/2020 Medical Record Number: [id number] Patient Account Number: [id number] Date of Birth/Sex: 06/07/1946 (75 y.o. 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 [place] (primary) hypertension R73.9 Hyperglycemia, unspecified Facility Procedures CPT4 Code: 28786767 Description: [place] VISIT-LEV 3 EST PT Modifier: Quantity: 1 CPT4 Code: [id number] Description: 28366 - DEB SUBQ TISSUE 20 SQ CM/< Modifier: Quantity: 1 CPT4 Code: Description: ICD-10 Diagnosis Description L97.822 Non-pressure chronic ulcer of other part of left lower leg with fat  layer Modifier: exposed Quantity: Physician Procedures CPT4 Code: 2947654 Description: 65035 - WC PHYS LEVEL 4 - NEW PT Modifier: 25 Quantity: 1 CPT4 Code: Description: ICD-10 Diagnosis Description 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

## 2020-08-31 ENCOUNTER — Other Ambulatory Visit
Admission: RE | Admit: 2020-08-31 | Discharge: 2020-08-31 | Disposition: A | Discharge status: Home / Self Care | Payer: Medicare Other | Source: Ambulatory Visit | Attending: Physician Assistant | Admitting: Physician Assistant

## 2020-08-31 DIAGNOSIS — L03116 Cellulitis of left lower limb: Secondary | ICD-10-CM | POA: Insufficient documentation

## 2020-09-02 NOTE — Progress Notes (Signed)
[name] ([id number]) Visit Report for 08/30/2020 Allergy List Details Patient Name: [name] Date of Service: 08/30/2020 3:00 PM Medical Record Number: [id number] Patient Account Number: [id number] Date of Birth/Sex: 06/10/1946 (75 y.o. Male) Treating RN: Dolan Amen Primary Care Chandan Fly: Otilio Miu Other Clinician: Referring Olliver Boyadjian: Otilio Miu Treating Haliey Romberg/Extender: Jeri Cos Weeks in Treatment: 0 Allergies Active Allergies latex Type: Food Allergy Notes Electronic Signature(s) Signed: 08/30/2020 4:33:18 PM By: Georges Mouse, Minus Breeding RN Entered By: Georges Mouse, Minus Breeding on 08/30/2020 15:24:34 [name] ([id number]) -------------------------------------------------------------------------------- [facility] Details Patient Name: [name] Date of Service: 08/30/2020 3:00 PM Medical Record Number: [id number] Patient Account Number: [id number] Date of Birth/Sex: 17-Dec-1945 (75 y.o. Male) Treating RN: Carlene Coria Primary Care Bettey Muraoka: Otilio Miu Other Clinician: Referring Courtland Coppa: Otilio Miu Treating Hadas Jessop/Extender: Skipper Cliche in Treatment: 0 Visit Information Patient Arrived: [place] Arrival Time: 15:13 Accompanied By: self Transfer Assistance: None Electronic Signature(s) Signed: 09/02/2020 11:33:31 AM By: Carlene Coria RN Entered By: Carlene Coria on 08/30/2020 15:15:24 [name] ([id number]) -------------------------------------------------------------------------------- Clinic Level of Care Assessment Details Patient Name: [name] Date of Service: 08/30/2020 3:00 PM Medical Record Number: [id number] Patient Account Number: [id number] Date of Birth/Sex: 06/24/1946 (75 y.o. Male) Treating RN: Dolan Amen Primary Care Jeniya Flannigan: Otilio Miu Other Clinician: Referring Shiesha Jahn: Otilio Miu Treating Nasean Zapf/Extender: Skipper Cliche in Treatment: 0 Clinic Level of Care Assessment Items TOOL  3 Quantity Score X - Use when EandM and Procedure is performed on FOLLOW-UP visit 1 0 ASSESSMENTS - Nursing Assessment / Reassessment X - Reassessment of Co-morbidities (includes updates in patient status) 1 10 X- 1 5 Reassessment of Adherence to Treatment Plan ASSESSMENTS - Wound and Skin Assessment / Reassessment '[]'  - Points for Wound Assessment can only be taken for a new wound of unknown or different etiology and a 0 procedure is NOT performed to that wound X- 1 5 Simple Wound Assessment / Reassessment - one wound '[]'  - 0 Complex Wound Assessment / Reassessment - multiple wounds '[]'  - 0 Dermatologic / Skin Assessment (not related to wound area) ASSESSMENTS - Focused Assessment '[]'  - Circumferential Edema Measurements - multi extremities 0 '[]'  - 0 Nutritional Assessment / Counseling / Intervention '[]'  - 0 Lower Extremity Assessment (monofilament, tuning fork, pulses) '[]'  - 0 Peripheral Arterial Disease Assessment (using hand held doppler) ASSESSMENTS - Ostomy and/or Continence Assessment and Care '[]'  - Incontinence Assessment and Management 0 '[]'  - 0 Ostomy Care Assessment and Management (repouching, etc.) PROCESS - Coordination of Care '[]'  - Points for Discharge Coordination can only be taken for a new wound of unknown or different etiology and a 0 procedure is NOT performed to that wound X- 1 15 Simple Patient / Family Education for ongoing care '[]'  - 0 Complex (extensive) Patient / Family Education for ongoing care '[]'  - 0 Staff obtains Programmer, systems, Records, Test Results / Process Orders '[]'  - 0 Staff telephones HHA, Nursing Homes / Clarify orders / etc '[]'  - 0 Routine Transfer to another Facility (non-emergent condition) '[]'  - 0 Routine Hospital Admission (non-emergent condition) '[]'  - 0 New Admissions / Biomedical engineer / Ordering NPWT, Apligraf, etc. '[]'  - 0 Emergency Hospital Admission (emergent condition) X- 1 10 Simple Discharge Coordination '[]'  - 0 Complex  (extensive) Discharge Coordination PROCESS - Special Needs '[]'  - Pediatric / Minor Patient Management 0 '[]'  - 0 Isolation Patient Management '[]'  - 0 Hearing / Language / Visual special needs '[]'  - 0 Assessment of Community assistance (transportation, D/C planning, etc.) [name] ([id number]) '[]'  - 0  Additional assistance / Altered mentation '[]'  - 0 Support Surface(s) Assessment (bed, cushion, seat, etc.) INTERVENTIONS - Wound Cleansing / Measurement '[]'  - Points for Wound Cleaning / Measurement, Wound Dressing, Specimen Collection and Specimen taken to lab 0 can only be taken for a new wound of unknown or different etiology and a procedure is NOT performed to that wound X- 1 5 Simple Wound Cleansing - one wound '[]'  - 0 Complex Wound Cleansing - multiple wounds X- 1 5 Wound Imaging (photographs - any number of wounds) '[]'  - 0 Wound Tracing (instead of photographs) X- 1 5 Simple Wound Measurement - one wound '[]'  - 0 Complex Wound Measurement - multiple wounds INTERVENTIONS - Wound Dressings '[]'  - Small Wound Dressing one or multiple wounds 0 X- 1 15 Medium Wound Dressing one or multiple wounds '[]'  - 0 Large Wound Dressing one or multiple wounds INTERVENTIONS - Miscellaneous '[]'  - External ear exam 0 X- 1 5 Specimen Collection (cultures, biopsies, blood, body fluids, etc.) X- 1 5 Specimen(s) / Culture(s) sent or taken to Lab for analysis '[]'  - 0 Patient Transfer (multiple staff / Civil Service fast streamer / Similar devices) '[]'  - 0 Simple Staple / Suture removal (25 or less) '[]'  - 0 Complex Staple / Suture removal (26 or more) '[]'  - 0 Hypo / Hyperglycemic Management (close monitor of Blood Glucose) X- 1 15 Ankle / Brachial Index (ABI) - do not check if billed separately X- 1 5 Vital Signs Has the patient been seen at the hospital within the last three years: Yes Total Score: 105 Level Of Care: New/Established - Level 3 Electronic Signature(s) Signed: 08/30/2020 4:33:18 PM By: Georges Mouse, Minus Breeding RN Entered By: Georges Mouse, Minus Breeding on 08/30/2020 16:28:03 [name] ([id number]) -------------------------------------------------------------------------------- Encounter Discharge Information Details Patient Name: [name] Date of Service: 08/30/2020 3:00 PM Medical Record Number: [id number] Patient Account Number: [id number] Date of Birth/Sex: [birth year] (74 y.o. Male) Treating RN: Dolan Amen Primary Care Kyler Lerette: Otilio Miu Other Clinician: Referring Kollen Armenti: Otilio Miu Treating Vergia Chea/Extender: Jeri Cos Weeks in Treatment: 0 Encounter Discharge Information Items Post Procedure Vitals Discharge Condition: Stable Temperature (F): 97.8 Ambulatory Status: Cane Pulse (bpm): 76 Discharge Destination: Home Respiratory Rate (breaths/min): 18 Transportation: Private Auto Blood Pressure (mmHg): 151/75 Accompanied By: self Schedule Follow-up Appointment: Yes Clinical Summary of Care: Electronic Signature(s) Signed: 08/30/2020 4:33:18 PM By: Georges Mouse, Minus Breeding RN Entered By: Georges Mouse, Minus Breeding on 08/30/2020 16:29:25 [name] ([id number]) -------------------------------------------------------------------------------- Lower Extremity Assessment Details Patient Name: [name] Date of Service: 08/30/2020 3:00 PM Medical Record Number: [id number] Patient Account Number: [id number] Date of Birth/Sex: 31-Oct-1945 (74 y.o. Male) Treating RN: Dolan Amen Primary Care Chael Urenda: Otilio Miu Other Clinician: Referring Lil Lepage: Otilio Miu Treating Lonnette Shrode/Extender: Jeri Cos Weeks in Treatment: 0 Edema Assessment Assessed: Shirlyn Goltz: Yes] [Right: Yes] Edema: [Left: No] [Right: No] Calf Left: Right: Point of Measurement: 33 cm From Medial Instep 38 cm 36 cm Ankle Left: Right: Point of Measurement: 10 cm From Medial Instep 24 cm 25.5 cm Vascular Assessment Pulses: Dorsalis Pedis Palpable: [Left:Yes]  [Right:Yes] Posterior Tibial Palpable: [Left:Yes] [Right:Yes] Blood Pressure: Brachial: [Left:126] [Right:118] Dorsalis Pedis: 158 [Left:Dorsalis Pedis: 388] Ankle: Posterior Tibial: 186 [Left:Posterior Tibial: 136 1.48] [Right:1.30] Electronic Signature(s) Signed: 08/30/2020 4:33:18 PM By: Georges Mouse, Minus Breeding RN Entered By: Georges Mouse, Minus Breeding on 08/30/2020 15:57:16 [name] ([id number]) -------------------------------------------------------------------------------- Multi Wound Chart Details Patient Name: [name] Date of Service: 08/30/2020 3:00 PM Medical Record Number: [id number] Patient Account Number: [id number] Date of Birth/Sex: [birth year] (75 y.o. Male) Treating RN: Laurin Coder,  Minus Breeding Primary Care Bryant Lipps: Otilio Miu Other Clinician: Referring Icholas Irby: Otilio Miu Treating Jaryan Chicoine/Extender: Jeri Cos Weeks in Treatment: 0 Vital Signs Height(in): 71 Pulse(bpm): 76 Weight(lbs): 205 Blood Pressure(mmHg): 151/75 Body Mass Index(BMI): 29 Temperature(F): 97.8 Respiratory Rate(breaths/min): 18 Photos: [N/A:N/A] Wound Location: Left, Lateral Lower Leg N/A N/A Wounding Event: Skin Tear/Laceration N/A N/A Primary Etiology: Trauma, Other N/A N/A Comorbid History: Human Immunodeficiency Virus, N/A N/A Hypertension Date Acquired: 07/30/2020 N/A N/A Weeks of Treatment: 0 N/A N/A Wound Status: Open N/A N/A Measurements L x W x D (cm) 2.5x4.1x0.1 N/A N/A Area (cm) : 8.05 N/A N/A Volume (cm) : 0.805 N/A N/A Classification: Full Thickness Without Exposed N/A N/A Support Structures Exudate Amount: Medium N/A N/A Exudate Type: Serosanguineous N/A N/A Exudate Color: red, brown N/A N/A Granulation Amount: Medium (34-66%) N/A N/A Granulation Quality: Red N/A N/A Necrotic Amount: Medium (34-66%) N/A N/A Exposed Structures: Fat Layer (Subcutaneous Tissue): N/A N/A Yes Fascia: No Tendon: No Muscle: No Joint: No Bone: No Epithelialization: None N/A  N/A Treatment Notes Electronic Signature(s) Signed: 08/30/2020 4:33:18 PM By: Georges Mouse, Minus Breeding RN Entered By: Georges Mouse, Minus Breeding on 08/30/2020 16:07:50 [name] ([id number]) -------------------------------------------------------------------------------- Multi-Disciplinary Care Plan Details Patient Name: [name] Date of Service: 08/30/2020 3:00 PM Medical Record Number: [id number] Patient Account Number: [id number] Date of Birth/Sex: [birth year] (75 y.o. Male) Treating RN: Dolan Amen Primary Care Bettylou Frew: Otilio Miu Other Clinician: Referring Demtrius Rounds: Otilio Miu Treating Tristina Sahagian/Extender: Skipper Cliche in Treatment: 0 Active Inactive Orientation to the Wound Care Program Nursing Diagnoses: Knowledge deficit related to the wound healing center program Goals: Patient/caregiver will verbalize understanding of the [facility] Date Initiated: 08/30/2020 Target Resolution Date: 08/30/2020 Goal Status: Active Interventions: Provide education on orientation to the wound center Notes: Venous Leg Ulcer Nursing Diagnoses: Actual venous Insuffiency (use after diagnosis is confirmed) Goals: Patient will maintain optimal edema control Date Initiated: 08/30/2020 Target Resolution Date: 09/27/2020 Goal Status: Active Verify adequate tissue perfusion prior to therapeutic compression application Date Initiated: 08/30/2020 Target Resolution Date: 08/30/2020 Goal Status: Active Interventions: Assess peripheral edema status every visit. Compression as ordered Provide education on venous insufficiency Notes: Wound/Skin Impairment Nursing Diagnoses: Impaired tissue integrity Goals: Ulcer/skin breakdown will have a volume reduction of 30% by week 4 Date Initiated: 08/30/2020 Target Resolution Date: 09/27/2020 Goal Status: Active Ulcer/skin breakdown will have a volume reduction of 50% by week 8 Date Initiated: 08/30/2020 Target Resolution  Date: 10/11/2020 Goal Status: Active Interventions: Assess patient/caregiver ability to obtain necessary supplies Assess patient/caregiver ability to perform ulcer/skin care regimen upon admission and as needed Assess ulceration(s) every visit Treatment Activities: TREASURE, INGRUM ([id number]) Skin care regimen initiated : 08/30/2020 Topical wound management initiated : 08/30/2020 Notes: Electronic Signature(s) Signed: 08/30/2020 4:33:18 PM By: Georges Mouse, Minus Breeding RN Entered By: Georges Mouse, Minus Breeding on 08/30/2020 16:07:35 [name] ([id number]) -------------------------------------------------------------------------------- Pain Assessment Details Patient Name: [name] Date of Service: 08/30/2020 3:00 PM Medical Record Number: [id number] Patient Account Number: [id number] Date of Birth/Sex: 1946/02/18 (75 y.o. Male) Treating RN: Carlene Coria Primary Care Kessler Kopinski: Otilio Miu Other Clinician: Referring Aspyn Warnke: Otilio Miu Treating Yanett Conkright/Extender: Jeri Cos Weeks in Treatment: 0 Active Problems Location of Pain Severity and Description of Pain Patient Has Paino No Site Locations Pain Management and Medication Current Pain Management: Electronic Signature(s) Signed: 09/02/2020 11:33:31 AM By: Carlene Coria RN Entered By: Carlene Coria on 08/30/2020 15:15:37 [name] ([id number]) -------------------------------------------------------------------------------- Patient/Caregiver Education Details Patient Name: [name] Date of Service: 08/30/2020 3:00 PM Medical Record Number: [id number] Patient Account Number:  [id number] Date of Birth/Gender: 02-11-1946 (75 y.o. Male) Treating RN: Dolan Amen Primary Care Physician: Otilio Miu Other Clinician: Referring Physician: Otilio Miu Treating Physician/Extender: Skipper Cliche in Treatment: 0 Education Assessment Education Provided To: Patient Education Topics Provided Wound/Skin  Impairment: Methods: Explain/Verbal Responses: State content correctly Electronic Signature(s) Signed: 08/30/2020 4:33:18 PM By: Georges Mouse, Minus Breeding RN Entered By: Georges Mouse, Minus Breeding on 08/30/2020 16:28:39 [name] ([id number]) -------------------------------------------------------------------------------- Wound Assessment Details Patient Name: [name] Date of Service: 08/30/2020 3:00 PM Medical Record Number: [id number] Patient Account Number: [id number] Date of Birth/Sex: 06-08-1946 (75 y.o. Male) Treating RN: Dolan Amen Primary Care Shine Scrogham: Otilio Miu Other Clinician: Referring Ammara Raj: Otilio Miu Treating Remas Sobel/Extender: Jeri Cos Weeks in Treatment: 0 Wound Status Wound Number: 1 Primary Etiology: Trauma, Other Wound Location: Left, Lateral Lower Leg Wound Status: Open Wounding Event: Skin Tear/Laceration Comorbid History: Human Immunodeficiency Virus, Hypertension Date Acquired: 07/30/2020 Weeks Of Treatment: 0 Clustered Wound: No Photos Wound Measurements Length: (cm) 2.5 Width: (cm) 4.1 Depth: (cm) 0.1 Area: (cm) 8.05 Volume: (cm) 0.805 % Reduction in Area: % Reduction in Volume: Epithelialization: None Tunneling: No Undermining: No Wound Description Classification: Full Thickness Without Exposed Support Structures Exudate Amount: Medium Exudate Type: Serosanguineous Exudate Color: red, brown Foul Odor After Cleansing: No Slough/Fibrino Yes Wound Bed Granulation Amount: Medium (34-66%) Exposed Structure Granulation Quality: Red Fascia Exposed: No Necrotic Amount: Medium (34-66%) Fat Layer (Subcutaneous Tissue) Exposed: Yes Necrotic Quality: Adherent Slough Tendon Exposed: No Muscle Exposed: No Joint Exposed: No Bone Exposed: No Treatment Notes Wound #1 (Lower Leg) Wound Laterality: Left, Lateral Cleanser [place] Ancillary Kit - 15 Day Supply Discharge Instruction: Use supplies as instructed; Kit contains: (15)  Saline Bullets; (15) 3x3 Gauze; 15 pr Gloves Normal Saline Discharge Instruction: Wash your hands with soap and water. Remove old dressing, discard into plastic bag and place into trash. Cleanse the wound with Normal Saline prior to applying a clean dressing using gauze sponges, not tissues or cotton balls. Do not ORVIN, NETTER ([id number]) scrub or use excessive force. Pat dry using gauze sponges, not tissue or cotton balls. Peri-Wound Care Topical Primary Dressing Hydrofera Blue Ready Transfer Foam, 2.5x2.5 (in/in) Discharge Instruction: Apply Hydrofera Blue Ready to wound bed as directed Secondary Dressing Conforming [place] Discharge Instruction: Apply Conforming Stretch Guaze Bandage as directed Secured With Tubigrip Size E, 3.5x10 (in/yds) Discharge Instruction: Apply 3 Tubigrip E 3-finger-widths below knee to base of toes to secure dressing and/or for swelling. Compression Wrap Compression Stockings Add-Ons Electronic Signature(s) Signed: 08/30/2020 4:33:18 PM By: Georges Mouse, Minus Breeding RN Entered By: Georges Mouse, Minus Breeding on 08/30/2020 15:39:13 [name] ([id number]) -------------------------------------------------------------------------------- [facility] Details Patient Name: [name] Date of Service: 08/30/2020 3:00 PM Medical Record Number: [id number] Patient Account Number: [id number] Date of Birth/Sex: 09-Dec-1945 (75 y.o. Male) Treating RN: Carlene Coria Primary Care Samaria Anes: Otilio Miu Other Clinician: Referring Piya Mesch: Otilio Miu Treating Javanna Patin/Extender: Skipper Cliche in Treatment: 0 Vital Signs Time Taken: 15:15 Temperature (F): 97.8 Height (in): 71 Pulse (bpm): 76 Source: Stated Respiratory Rate (breaths/min): 18 Weight (lbs): 205 Blood Pressure (mmHg): 151/75 Source: Stated Reference Range: 80 - 120 mg / dl Body Mass Index (BMI): 28.6 Electronic Signature(s) Signed: 09/02/2020 11:33:31 AM By: Carlene Coria RN Entered  By: Carlene Coria on 08/30/2020 15:16:43

## 2020-09-03 LAB — AEROBIC CULTURE W GRAM STAIN (SUPERFICIAL SPECIMEN)
Culture: NO GROWTH
Gram Stain: NONE SEEN

## 2020-09-06 ENCOUNTER — Other Ambulatory Visit: Payer: Self-pay

## 2020-09-06 ENCOUNTER — Encounter: Payer: Medicare Other | Admitting: Physician Assistant

## 2020-09-06 DIAGNOSIS — I872 Venous insufficiency (chronic) (peripheral): Secondary | ICD-10-CM | POA: Diagnosis not present

## 2020-09-06 DIAGNOSIS — I10 Essential (primary) hypertension: Secondary | ICD-10-CM | POA: Diagnosis not present

## 2020-09-06 DIAGNOSIS — L97812 Non-pressure chronic ulcer of other part of right lower leg with fat layer exposed: Secondary | ICD-10-CM | POA: Diagnosis not present

## 2020-09-06 DIAGNOSIS — L97822 Non-pressure chronic ulcer of other part of left lower leg with fat layer exposed: Secondary | ICD-10-CM | POA: Diagnosis not present

## 2020-09-06 DIAGNOSIS — Z8673 Personal history of transient ischemic attack (TIA), and cerebral infarction without residual deficits: Secondary | ICD-10-CM | POA: Diagnosis not present

## 2020-09-08 NOTE — Progress Notes (Signed)
DERELLE, COCKRELL ([id number]) Visit Report for 09/06/2020 Arrival Information Details Patient Name: [name] Date of Service: 09/06/2020 2:45 PM Medical Record Number: [id number] Patient Account Number: [id number] Date of Birth/Sex: 1945-08-22 (75 y.o. M) Treating RN: [name] Primary Care [name]: [name] Other Clinician: Referring [name]: [name] Treating [name]/Extender: [name] in Treatment: 1 Visit Information History Since Last Visit Pain Present Now: No Patient Arrived: Cane Arrival Time: 14:43 Accompanied By: self Transfer Assistance: None Patient Identification Verified: Yes Secondary Verification Process Completed: Yes Electronic Signature(s) Signed: 09/06/2020 4:42:11 PM By: [name], Mark Breeding RN Entered By: [name], [name] on 09/06/2020 14:43:44 [name] ([id number]) -------------------------------------------------------------------------------- Clinic Level of Care Assessment Details Patient Name: [name] Date of Service: 09/06/2020 2:45 PM Medical Record Number: [id number] Patient Account Number: [id number] Date of Birth/Sex: Apr 14, 1946 (74 y.o. M) Treating RN: [name] Primary Care Maat Kafer: [name] Other Clinician: Referring [name]: [name] Treating [name]/Extender: [name] in Treatment: 1 Clinic Level of Care Assessment Items TOOL 4 Quantity Score X - Use when only an EandM is performed on FOLLOW-UP visit 1 0 ASSESSMENTS - Nursing Assessment / Reassessment '[]'  - Reassessment of Co-morbidities (includes updates in patient status) 0 '[]'  - 0 Reassessment of Adherence to Treatment Plan ASSESSMENTS - Wound and Skin Assessment / Reassessment X - Simple Wound Assessment / Reassessment - one wound 1 5 '[]'  - 0 Complex Wound Assessment / Reassessment - multiple wounds '[]'  - 0 Dermatologic / Skin Assessment (not related to wound area) ASSESSMENTS - Focused Assessment '[]'  -  Circumferential Edema Measurements - multi extremities 0 '[]'  - 0 Nutritional Assessment / Counseling / Intervention '[]'  - 0 Lower Extremity Assessment (monofilament, tuning fork, pulses) '[]'  - 0 Peripheral Arterial Disease Assessment (using hand held doppler) ASSESSMENTS - Ostomy and/or Continence Assessment and Care '[]'  - Incontinence Assessment and Management 0 '[]'  - 0 Ostomy Care Assessment and Management (repouching, etc.) PROCESS - Coordination of Care X - Simple Patient / Family Education for ongoing care 1 15 '[]'  - 0 Complex (extensive) Patient / Family Education for ongoing care X- 1 10 Staff obtains Programmer, systems, Records, Test Results / Process Orders '[]'  - 0 Staff telephones HHA, Nursing Homes / Clarify orders / etc '[]'  - 0 Routine Transfer to another Facility (non-emergent condition) '[]'  - 0 Routine Hospital Admission (non-emergent condition) '[]'  - 0 New Admissions / Biomedical engineer / Ordering NPWT, Apligraf, etc. '[]'  - 0 Emergency Hospital Admission (emergent condition) X- 1 10 Simple Discharge Coordination '[]'  - 0 Complex (extensive) Discharge Coordination PROCESS - Special Needs '[]'  - Pediatric / Minor Patient Management 0 '[]'  - 0 Isolation Patient Management '[]'  - 0 Hearing / Language / Visual special needs '[]'  - 0 Assessment of Community assistance (transportation, D/C planning, etc.) '[]'  - 0 Additional assistance / Altered mentation '[]'  - 0 Support Surface(s) Assessment (bed, cushion, seat, etc.) INTERVENTIONS - Wound Cleansing / Measurement Kennedy, Mark ([id number]) X- 1 5 Simple Wound Cleansing - one wound '[]'  - 0 Complex Wound Cleansing - multiple wounds X- 1 5 Wound Imaging (photographs - any number of wounds) '[]'  - 0 Wound Tracing (instead of photographs) X- 1 5 Simple Wound Measurement - one wound '[]'  - 0 Complex Wound Measurement - multiple wounds INTERVENTIONS - Wound Dressings '[]'  - Small Wound Dressing one or multiple wounds 0 X- 1 15 Medium  Wound Dressing one or multiple wounds '[]'  - 0 Large Wound Dressing one or multiple wounds '[]'  - 0 Application of Medications - topical '[]'  -  0 Application of Medications - injection INTERVENTIONS - Miscellaneous '[]'  - External ear exam 0 '[]'  - 0 Specimen Collection (cultures, biopsies, blood, body fluids, etc.) '[]'  - 0 Specimen(s) / Culture(s) sent or taken to Lab for analysis '[]'  - 0 Patient Transfer (multiple staff / Civil Service fast streamer / Similar devices) '[]'  - 0 Simple Staple / Suture removal (25 or less) '[]'  - 0 Complex Staple / Suture removal (26 or more) '[]'  - 0 Hypo / Hyperglycemic Management (close monitor of Blood Glucose) '[]'  - 0 Ankle / Brachial Index (ABI) - do not check if billed separately X- 1 5 Vital Signs Has the patient been seen at the hospital within the last three years: Yes Total Score: 75 Level Of Care: New/Established - Level 2 Electronic Signature(s) Signed: 09/08/2020 9:59:20 AM By: Mark Coria RN Entered By: [name] on 09/06/2020 15:06:45 [name] ([id number]) -------------------------------------------------------------------------------- Lower Extremity Assessment Details Patient Name: [name] Date of Service: 09/06/2020 2:45 PM Medical Record Number: [id number] Patient Account Number: [id number] Date of Birth/Sex: [birth year] (74 y.o. M) Treating RN: [name] Primary Care Ortha Metts: [name] Other Clinician: Referring Annika Selke: [name] Treating Adolph Clutter/Extender: [name] in Treatment: 1 Edema Assessment Assessed: [Left: No] [Right: Yes] Edema: [Left: N] [Right: o] Calf Left: Right: Point of Measurement: 33 cm From Medial Instep 37.5 cm Ankle Left: Right: Point of Measurement: 10 cm From Medial Instep 24 cm Vascular Assessment Pulses: Dorsalis Pedis Palpable: [Right:Yes] Posterior Tibial Palpable: [Right:Yes] Electronic Signature(s) Signed: 09/06/2020 4:42:11 PM By: [name], Mark Breeding RN Entered By:  [name], [name] on 09/06/2020 14:52:32 [name] ([id number]) -------------------------------------------------------------------------------- Multi Wound Chart Details Patient Name: [name] Date of Service: 09/06/2020 2:45 PM Medical Record Number: [id number] Patient Account Number: [id number] Date of Birth/Sex: 1945-08-23 (75 y.o. M) Treating RN: [name] Primary Care Talullah Abate: [name] Other Clinician: Referring Averi Kilty: [name] Treating Emalea Mix/Extender: [name] in Treatment: 1 Vital Signs Height(in): 71 Pulse(bpm): 67 Weight(lbs): 205 Blood Pressure(mmHg): 149/75 Body Mass Index(BMI): 29 Temperature(F): 97.6 Respiratory Rate(breaths/min): 18 Photos: [N/A:N/A] Wound Location: Left, Lateral Lower Leg N/A N/A Wounding Event: Skin Tear/Laceration N/A N/A Primary Etiology: Trauma, Other N/A N/A Comorbid History: Human Immunodeficiency Virus, N/A N/A Hypertension Date Acquired: 07/30/2020 N/A N/A Kennedy of Treatment: 1 N/A N/A Wound Status: Open N/A N/A Measurements L x W x D (cm) 2.4x3.6x0.1 N/A N/A Area (cm) : 6.786 N/A N/A Volume (cm) : 0.679 N/A N/A % Reduction in Area: 15.70% N/A N/A % Reduction in Volume: 15.70% N/A N/A Classification: Full Thickness Without Exposed N/A N/A Support Structures Exudate Amount: Medium N/A N/A Exudate Type: Serosanguineous N/A N/A Exudate Color: red, brown N/A N/A Wound Margin: Flat and Intact N/A N/A Granulation Amount: Large (67-100%) N/A N/A Granulation Quality: Red N/A N/A Necrotic Amount: None Present (0%) N/A N/A Exposed Structures: Fat Layer (Subcutaneous Tissue): N/A N/A Yes Fascia: No Tendon: No Muscle: No Joint: No Bone: No Epithelialization: None N/A N/A Treatment Notes Electronic Signature(s) Signed: 09/08/2020 9:59:20 AM By: Mark Coria RN Entered By: [name] on 09/06/2020 15:05:36 [name]  ([id number]) -------------------------------------------------------------------------------- Multi-Disciplinary Care Plan Details Patient Name: [name] Date of Service: 09/06/2020 2:45 PM Medical Record Number: [id number] Patient Account Number: [id number] Date of Birth/Sex: January 06, 1946 (74 y.o. M) Treating RN: [name] Primary Care Leialoha Hanna: [name] Other Clinician: Referring Rosalena Mccorry: [name] Treating Kemiah Booz/Extender: [name] in Treatment: 1 Active Inactive Venous Leg Ulcer Nursing Diagnoses: Actual venous Insuffiency (use after diagnosis is confirmed) Goals: Patient will maintain optimal edema  control Date Initiated: 08/30/2020 Target Resolution Date: 09/27/2020 Goal Status: Active Verify adequate tissue perfusion prior to therapeutic compression application Date Initiated: 08/30/2020 Date Inactivated: 09/06/2020 Target Resolution Date: 08/30/2020 Goal Status: Met Interventions: Assess peripheral edema status every visit. Compression as ordered Provide education on venous insufficiency Notes: Wound/Skin Impairment Nursing Diagnoses: Impaired tissue integrity Goals: Ulcer/skin breakdown will have a volume reduction of 30% by week 4 Date Initiated: 08/30/2020 Target Resolution Date: 09/27/2020 Goal Status: Active Ulcer/skin breakdown will have a volume reduction of 50% by week 8 Date Initiated: 08/30/2020 Target Resolution Date: 10/11/2020 Goal Status: Active Interventions: Assess patient/caregiver ability to obtain necessary supplies Assess patient/caregiver ability to perform ulcer/skin care regimen upon admission and as needed Assess ulceration(s) every visit Treatment Activities: Skin care regimen initiated : 08/30/2020 Topical wound management initiated : 08/30/2020 Notes: Electronic Signature(s) Signed: 09/08/2020 9:59:20 AM By: Mark Coria RN Entered By: [name] on 09/06/2020 15:05:01 [name]  ([id number]) -------------------------------------------------------------------------------- Pain Assessment Details Patient Name: [name] Date of Service: 09/06/2020 2:45 PM Medical Record Number: [id number] Patient Account Number: [id number] Date of Birth/Sex: 1946/02/17 (75 y.o. M) Treating RN: [name] Primary Care Shalia Bartko: [name] Other Clinician: Referring Christinea Brizuela: [name] Treating Lennyx Verdell/Extender: [name] in Treatment: 1 Active Problems Location of Pain Severity and Description of Pain Patient Has Paino No Site Locations Rate the pain. Current Pain Level: 0 Pain Management and Medication Current Pain Management: Electronic Signature(s) Signed: 09/06/2020 4:42:11 PM By: [name], Mark Breeding RN Entered By: [name], [name] on 09/06/2020 14:44:26 [name] ([id number]) -------------------------------------------------------------------------------- Patient/Caregiver Education Details Patient Name: [name] Date of Service: 09/06/2020 2:45 PM Medical Record Number: [id number] Patient Account Number: [id number] Date of Birth/Gender: [birth year] (75 y.o. M) Treating RN: [name] Primary Care Physician: [name] Other Clinician: Referring Physician: Otilio Kennedy Treating Physician/Extender: [name] in Treatment: 1 Education Assessment Education Provided To: Patient Education Topics Provided Venous: Methods: Explain/Verbal Responses: State content correctly Electronic Signature(s) Signed: 09/08/2020 9:59:20 AM By: Mark Coria RN Entered By: [name] on 09/06/2020 15:07:02 [name] ([id number]) -------------------------------------------------------------------------------- Wound Assessment Details Patient Name: [name] Date of Service: 09/06/2020 2:45 PM Medical Record Number: [id number] Patient Account Number: [id number] Date of Birth/Sex: Sep 16, 1945 (75 y.o. M) Treating RN:  [name] Primary Care Helmer Dull: [name] Other Clinician: Referring Tonie Elsey: [name] Treating Cathyrn Deas/Extender: [name] in Treatment: 1 Wound Status Wound Number: 1 Primary Etiology: Trauma, Other Wound Location: Left, Lateral Lower Leg Wound Status: Open Wounding Event: Skin Tear/Laceration Comorbid History: Human Immunodeficiency Virus, Hypertension Date Acquired: 07/30/2020 Kennedy Of Treatment: 1 Clustered Wound: No Photos Wound Measurements Length: (cm) 2.4 Width: (cm) 3.6 Depth: (cm) 0.1 Area: (cm) 6.786 Volume: (cm) 0.679 % Reduction in Area: 15.7% % Reduction in Volume: 15.7% Epithelialization: None Tunneling: No Undermining: No Wound Description Classification: Full Thickness Without Exposed Support Structu Wound Margin: Flat and Intact Exudate Amount: Medium Exudate Type: Serosanguineous Exudate Color: red, brown res Foul Odor After Cleansing: No Slough/Fibrino Yes Wound Bed Granulation Amount: Large (67-100%) Exposed Structure Granulation Quality: Red Fascia Exposed: No Necrotic Amount: None Present (0%) Fat Layer (Subcutaneous Tissue) Exposed: Yes Tendon Exposed: No Muscle Exposed: No Joint Exposed: No Bone Exposed: No Electronic Signature(s) Signed: 09/06/2020 4:42:11 PM By: [name], Mark Breeding RN Entered By: [name], [name] on 09/06/2020 14:50:49 [name] ([id number]) -------------------------------------------------------------------------------- Vitals Details Patient Name: [name] Date of Service: 09/06/2020 2:45 PM Medical Record Number: [id number] Patient Account Number: [id number] Date of Birth/Sex: [birth year] (75 y.o. M) Treating RN:  [name] Primary Care Jadalee Westcott: [name] Other Clinician: Referring Jaidah Lomax: [name] Treating Bijou Easler/Extender: [name] in Treatment: 1 Vital Signs Time Taken: 14:44 Temperature (F): 97.6 Height (in): 71 Pulse (bpm):  67 Weight (lbs): 205 Respiratory Rate (breaths/min): 18 Body Mass Index (BMI): 28.6 Blood Pressure (mmHg): 149/75 Reference Range: 80 - 120 mg / dl Electronic Signature(s) Signed: 09/06/2020 4:42:11 PM By: [name], Mark Breeding RN Entered By: [name], [name] on 09/06/2020 14:44:16

## 2020-09-08 NOTE — Progress Notes (Signed)
MIRZA, FESSEL ([id number]) Visit Report for 09/06/2020 Chief Complaint Document Details Patient Name: [name], [name] Date of Service: 09/06/2020 2:45 PM Medical Record Number: [id number] Patient Account Number: [id number] Date of Birth/Sex: [birth year] (75 y.o. M) Treating RN: Carlene Coria Primary Care Provider: Otilio Miu Other Clinician: Referring Provider: Otilio Miu Treating Provider/Extender: Skipper Cliche in Treatment: 1 Information Obtained from: Patient Chief Complaint Left LE Ulcer Electronic Signature(s) Signed: 09/06/2020 2:40:57 PM By: Worthy Keeler PA-C Entered By: Worthy Keeler on 09/06/2020 14:40:57 [name] ([id number]) -------------------------------------------------------------------------------- HPI Details Patient Name: [name] Date of Service: 09/06/2020 2:45 PM Medical Record Number: [id number] Patient Account Number: [id number] Date of Birth/Sex: 05-09-1946 (75 y.o. M) Treating RN: Carlene Coria Primary Care Provider: Otilio Miu Other Clinician: Referring Provider: Otilio Miu Treating Provider/Extender: Skipper Cliche in Treatment: 1 History of Present Illness HPI Description: 08/30/2020 upon evaluation today patient presents today for inspection regarding a wound on his left lower extremity. He does have evidence of some chronic venous insufficiency and hemosiderin staining. With that being said he tells me this began after his stroke though he does not appear to be too swollen today this is good news. Fortunately there is no evidence of active infection at this time. No fevers, chills, nausea, vomiting, or diarrhea. He does have a history of hypertension and occasional elevated blood sugar levels although he does not have a formal diagnosis of diabetes according to what he tells me today. 09/06/2020 upon evaluation today patient appears to be doing well with regard to his leg ulcer. I do feel like the Tubigrip has helped and I  do feel like as well but the patient seems to be making good progress all things considered. In the past week this has measured better and seems to be healing quite well that is excellent news. Overall I am extremely pleased at this point. Electronic Signature(s) Signed: 09/06/2020 3:44:51 PM By: Worthy Keeler PA-C Entered By: Worthy Keeler on 09/06/2020 15:44:51 [name] ([id number]) -------------------------------------------------------------------------------- Physical Exam Details Patient Name: [name] Date of Service: 09/06/2020 2:45 PM Medical Record Number: [id number] Patient Account Number: [id number] Date of Birth/Sex: 1945/09/30 (75 y.o. M) Treating RN: Carlene Coria Primary Care Provider: Otilio Miu Other Clinician: Referring Provider: Otilio Miu Treating Provider/Extender: Jeri Cos Weeks in Treatment: 1 Constitutional Well-nourished and well-hydrated in no acute distress. Respiratory normal breathing without difficulty. Psychiatric this patient is able to make decisions and demonstrates good insight into disease process. Alert and Oriented x 3. pleasant and cooperative. Notes Patient's wound bed actually showed signs of good granulation epithelization at this point. There does not appear to be any evidence of active infection which is great news and overall I am extremely pleased with where we stand today. Electronic Signature(s) Signed: 09/06/2020 3:45:25 PM By: Worthy Keeler PA-C Entered By: Worthy Keeler on 09/06/2020 15:45:25 [name] ([id number]) -------------------------------------------------------------------------------- Physician Orders Details Patient Name: [name] Date of Service: 09/06/2020 2:45 PM Medical Record Number: [id number] Patient Account Number: [id number] Date of Birth/Sex: 09/01/1945 (75 y.o. M) Treating RN: Carlene Coria Primary Care Provider: Otilio Miu Other Clinician: Referring Provider: Otilio Miu Treating Provider/Extender: Skipper Cliche in Treatment: 1 Verbal / Phone Orders: No Diagnosis Coding ICD-10 Coding Code Description I87.2 Venous insufficiency (chronic) (peripheral) L97.822 Non-pressure chronic ulcer of other part of left lower leg with fat layer exposed [place] (primary) hypertension R73.9 Hyperglycemia, unspecified Follow-up Appointments o Return Appointment in 1 week. Bathing/ Shower/ Hygiene o Clean wound with Normal  Saline or wound cleanser. Edema Control - Lymphedema / Segmental Compressive Device / Other Wound #1 Left,Lateral Lower Leg o Elevate legs to the level of the heart and pump ankles as often as possible o Elevate leg(s) parallel to the floor when sitting. o Other: - Tubi grip E Wound Treatment Wound #1 - Lower Leg Wound Laterality: Left, Lateral Cleanser: Byram Ancillary Kit - 15 Day Supply (Generic) 3 x Per Week/30 Days Discharge Instructions: Use supplies as instructed; Kit contains: (15) Saline Bullets; (15) 3x3 Gauze; 15 pr Gloves Cleanser: Normal Saline (Generic) 3 x Per Week/30 Days Discharge Instructions: Wash your hands with soap and water. Remove old dressing, discard into plastic bag and place into trash. Cleanse the wound with Normal Saline prior to applying a clean dressing using gauze sponges, not tissues or cotton balls. Do not scrub or use excessive force. Pat dry using gauze sponges, not tissue or cotton balls. Primary Dressing: Hydrofera Blue Ready Transfer Foam, 2.5x2.5 (in/in) (Generic) 3 x Per Week/30 Days Discharge Instructions: Apply Hydrofera Blue Ready to wound bed as directed Secondary Dressing: Conforming Guaze Roll-Large (Generic) 3 x Per Week/30 Days Discharge Instructions: Apply Conforming Stretch Guaze Bandage as directed Secured With: Tubigrip Size E, 3.5x10 (in/yds) (Generic) 3 x Per Week/30 Days Discharge Instructions: Apply 3 Tubigrip E 3-finger-widths below knee to base of toes to secure  dressing and/or for swelling. Electronic Signature(s) Signed: 09/06/2020 4:15:06 PM By: Worthy Keeler PA-C Signed: 09/08/2020 9:59:20 AM By: Carlene Coria RN Entered By: Carlene Coria on 09/06/2020 15:06:16 [name] ([id number]) -------------------------------------------------------------------------------- Problem List Details Patient Name: [name] Date of Service: 09/06/2020 2:45 PM Medical Record Number: [id number] Patient Account Number: [id number] Date of Birth/Sex: 1945/09/28 (75 y.o. M) Treating RN: Carlene Coria Primary Care Provider: Otilio Miu Other Clinician: Referring Provider: Otilio Miu Treating Provider/Extender: Skipper Cliche in Treatment: 1 Active Problems ICD-10 Encounter Code Description Active Date MDM Diagnosis I87.2 Venous insufficiency (chronic) (peripheral) 08/30/2020 No Yes L97.822 Non-pressure chronic ulcer of other part of left lower leg with fat layer 08/30/2020 No Yes exposed [place] (primary) hypertension 08/30/2020 No Yes R73.9 Hyperglycemia, unspecified 08/30/2020 No Yes Inactive Problems Resolved Problems Electronic Signature(s) Signed: 09/06/2020 2:40:51 PM By: Worthy Keeler PA-C Entered By: Worthy Keeler on 09/06/2020 14:40:51 [name] ([id number]) -------------------------------------------------------------------------------- Progress Note Details Patient Name: [name] Date of Service: 09/06/2020 2:45 PM Medical Record Number: [id number] Patient Account Number: [id number] Date of Birth/Sex: [birth year] (75 y.o. M) Treating RN: Carlene Coria Primary Care Provider: Otilio Miu Other Clinician: Referring Provider: Otilio Miu Treating Provider/Extender: Skipper Cliche in Treatment: 1 Subjective Chief Complaint Information obtained from Patient Left LE Ulcer History of Present Illness (HPI) 08/30/2020 upon evaluation today patient presents today for inspection regarding a wound on his left lower  extremity. He does have evidence of some chronic venous insufficiency and hemosiderin staining. With that being said he tells me this began after his stroke though he does not appear to be too swollen today this is good news. Fortunately there is no evidence of active infection at this time. No fevers, chills, nausea, vomiting, or diarrhea. He does have a history of hypertension and occasional elevated blood sugar levels although he does not have a formal diagnosis of diabetes according to what he tells me today. 09/06/2020 upon evaluation today patient appears to be doing well with regard to his leg ulcer. I do feel like the Tubigrip has helped and I do feel like as well but the patient seems to  be making good progress all things considered. In the past week this has measured better and seems to be healing quite well that is excellent news. Overall I am extremely pleased at this point. Objective Constitutional Well-nourished and well-hydrated in no acute distress. Vitals Time Taken: 2:44 PM, Height: 71 in, Weight: 205 lbs, BMI: 28.6, Temperature: 97.6 F, Pulse: 67 bpm, Respiratory Rate: 18 breaths/min, Blood Pressure: 149/75 mmHg. Respiratory normal breathing without difficulty. Psychiatric this patient is able to make decisions and demonstrates good insight into disease process. Alert and Oriented x 3. pleasant and cooperative. General Notes: Patient's wound bed actually showed signs of good granulation epithelization at this point. There does not appear to be any evidence of active infection which is great news and overall I am extremely pleased with where we stand today. Integumentary (Hair, Skin) Wound #1 status is Open. Original cause of wound was Skin Tear/Laceration. The wound is located on the Left,Lateral Lower Leg. The wound measures 2.4cm length x 3.6cm width x 0.1cm depth; 6.786cm^2 area and 0.679cm^3 volume. There is Fat Layer (Subcutaneous Tissue) exposed. There is no tunneling  or undermining noted. There is a medium amount of serosanguineous drainage noted. The wound margin is flat and intact. There is large (67-100%) red granulation within the wound bed. There is no necrotic tissue within the wound bed. Assessment Active Problems ICD-10 Venous insufficiency (chronic) (peripheral) Non-pressure chronic ulcer of other part of left lower leg with fat layer exposed [place] (primary) hypertension Hyperglycemia, unspecified KATLIN, CISZEWSKI ([id number]) Plan Follow-up Appointments: Return Appointment in 1 week. Bathing/ Shower/ Hygiene: Clean wound with Normal Saline or wound cleanser. Edema Control - Lymphedema / Segmental Compressive Device / Other: Wound #1 Left,Lateral Lower Leg: Elevate legs to the level of the heart and pump ankles as often as possible Elevate leg(s) parallel to the floor when sitting. Other: - Tubi grip E WOUND #1: - Lower Leg Wound Laterality: Left, Lateral Cleanser: Byram Ancillary Kit - 15 Day Supply (Generic) 3 x Per Week/30 Days Discharge Instructions: Use supplies as instructed; Kit contains: (15) Saline Bullets; (15) 3x3 Gauze; 15 pr Gloves Cleanser: Normal Saline (Generic) 3 x Per Week/30 Days Discharge Instructions: Wash your hands with soap and water. Remove old dressing, discard into plastic bag and place into trash. Cleanse the wound with Normal Saline prior to applying a clean dressing using gauze sponges, not tissues or cotton balls. Do not scrub or use excessive force. Pat dry using gauze sponges, not tissue or cotton balls. Primary Dressing: Hydrofera Blue Ready Transfer Foam, 2.5x2.5 (in/in) (Generic) 3 x Per Week/30 Days Discharge Instructions: Apply Hydrofera Blue Ready to wound bed as directed Secondary Dressing: Conforming Guaze Roll-Large (Generic) 3 x Per Week/30 Days Discharge Instructions: Apply Conforming Stretch Guaze Bandage as directed Secured With: Tubigrip Size E, 3.5x10 (in/yds) (Generic) 3 x Per Week/30  Days Discharge Instructions: Apply 3 Tubigrip E 3-finger-widths below knee to base of toes to secure dressing and/or for swelling. 1. I would recommend currently that we go ahead and continue with the [facility] I feel like this is doing a great job for the patient currently. 2. I am also can recommend that we have the patient continue to use the Tubigrip he does tell me it is kind of hard to get this off but nonetheless I think it is extremely beneficial some hopeful that we can continue as such to see this improvement. We will see patient back for reevaluation in 1 week here in the clinic. If anything worsens  or changes patient will contact our office for additional recommendations. Electronic Signature(s) Signed: 09/06/2020 3:46:07 PM By: Worthy Keeler PA-C Entered By: Worthy Keeler on 09/06/2020 15:46:07 [name] ([id number]) -------------------------------------------------------------------------------- SuperBill Details Patient Name: [name] Date of Service: 09/06/2020 Medical Record Number: [id number] Patient Account Number: [id number] Date of Birth/Sex: 12-23-1945 (75 y.o. M) Treating RN: Carlene Coria Primary Care Provider: Otilio Miu Other Clinician: Referring Provider: Otilio Miu Treating Provider/Extender: Jeri Cos Weeks in Treatment: 1 Diagnosis Coding ICD-10 Codes Code Description I87.2 Venous insufficiency (chronic) (peripheral) L97.822 Non-pressure chronic ulcer of other part of left lower leg with fat layer exposed [place] (primary) hypertension R73.9 Hyperglycemia, unspecified Facility Procedures CPT4 Code: [id number] Description: [phone] - WOUND CARE VISIT-LEV 2 EST PT Modifier: Quantity: 1 Physician Procedures CPT4 Code: 2009417 Description: 91995 - WC PHYS LEVEL 3 - EST PT Modifier: Quantity: 1 CPT4 Code: Description: ICD-10 Diagnosis Description I87.2 Venous insufficiency (chronic) (peripheral) L97.822 Non-pressure chronic  ulcer of other part of left lower leg with fat lay I10 Essential (primary) hypertension R73.9 Hyperglycemia, unspecified Modifier: er exposed Quantity: Electronic Signature(s) Signed: 09/06/2020 3:46:22 PM By: Worthy Keeler PA-C Entered By: Worthy Keeler on 09/06/2020 15:46:22

## 2020-09-10 ENCOUNTER — Ambulatory Visit: Payer: Medicare Other | Admitting: Family Medicine

## 2020-09-13 ENCOUNTER — Encounter: Payer: Medicare Other | Attending: Physician Assistant | Admitting: Physician Assistant

## 2020-09-13 ENCOUNTER — Other Ambulatory Visit: Payer: Self-pay

## 2020-09-13 DIAGNOSIS — R739 Hyperglycemia, unspecified: Secondary | ICD-10-CM | POA: Diagnosis not present

## 2020-09-13 DIAGNOSIS — I10 Essential (primary) hypertension: Secondary | ICD-10-CM | POA: Diagnosis not present

## 2020-09-13 DIAGNOSIS — L97822 Non-pressure chronic ulcer of other part of left lower leg with fat layer exposed: Secondary | ICD-10-CM | POA: Insufficient documentation

## 2020-09-13 DIAGNOSIS — I872 Venous insufficiency (chronic) (peripheral): Secondary | ICD-10-CM | POA: Diagnosis not present

## 2020-09-13 NOTE — Progress Notes (Addendum)
Mark, Kennedy ([id number]) Visit Report for 09/13/2020 Chief Complaint Document Details Patient Name: [name], [name] Date of Service: 09/13/2020 10:00 AM Medical Record Number: [id number] Patient Account Number: [id number] Date of Birth/Sex: Sep 22, 1945 (75 y.o. M) Treating RN: Carlene Coria Primary Care Provider: Otilio Miu Other Clinician: Referring Provider: Otilio Miu Treating Provider/Extender: Skipper Cliche in Treatment: 2 Information Obtained from: Patient Chief Complaint Left LE Ulcer Electronic Signature(s) Signed: 09/13/2020 10:15:31 AM By: Worthy Keeler PA-C Entered By: Worthy Keeler on 09/13/2020 10:15:30 [name] ([id number]) -------------------------------------------------------------------------------- HPI Details Patient Name: [name] Date of Service: 09/13/2020 10:00 AM Medical Record Number: [id number] Patient Account Number: [id number] Date of Birth/Sex: 21-Jul-1946 (74 y.o. M) Treating RN: Carlene Coria Primary Care Provider: Otilio Miu Other Clinician: Referring Provider: Otilio Miu Treating Provider/Extender: Skipper Cliche in Treatment: 2 History of Present Illness HPI Description: 08/30/2020 upon evaluation today patient presents today for inspection regarding a wound on his left lower extremity. He does have evidence of some chronic venous insufficiency and hemosiderin staining. With that being said he tells me this began after his stroke though he does not appear to be too swollen today this is good news. Fortunately there is no evidence of active infection at this time. No fevers, chills, nausea, vomiting, or diarrhea. He does have a history of hypertension and occasional elevated blood sugar levels although he does not have a formal diagnosis of diabetes according to what he tells me today. 09/06/2020 upon evaluation today patient appears to be doing well with regard to his leg ulcer. I do feel like the Tubigrip has helped and I do  feel like as well but the patient seems to be making good progress all things considered. In the past week this has measured better and seems to be healing quite well that is excellent news. Overall I am extremely pleased at this point. 09/13/2020 upon evaluation today patient appears to be doing well with regard to his wound. He has been tolerating the dressing changes with Hydrofera Blue without complication the wound bed appears to be doing significantly better which is great news. Overall I'm extremely pleased with where things stand. I do believe he still has a little bit of time to get this to heal just due to the overall size of the wound but nonetheless I think we are headed in the appropriate direction making good progress based on what I'm seeing. Electronic Signature(s) Signed: 09/13/2020 10:23:32 AM By: Worthy Keeler PA-C Entered By: Worthy Keeler on 09/13/2020 10:23:32 [name] ([id number]) -------------------------------------------------------------------------------- Physical Exam Details Patient Name: [name] Date of Service: 09/13/2020 10:00 AM Medical Record Number: [id number] Patient Account Number: [id number] Date of Birth/Sex: 1945/09/30 (74 y.o. M) Treating RN: Carlene Coria Primary Care Provider: Otilio Miu Other Clinician: Referring Provider: Otilio Miu Treating Provider/Extender: Jeri Cos Weeks in Treatment: 2 Constitutional Well-nourished and well-hydrated in no acute distress. Respiratory normal breathing without difficulty. Psychiatric this patient is able to make decisions and demonstrates good insight into disease process. Alert and Oriented x 3. pleasant and cooperative. Notes Upon inspection patient's wound bed did not require sharp debridement at this point which is great news. With that being said I do feel like that he is tolerating the dressing changes without complication overall I'm extremely pleased with where things stand  today. Electronic Signature(s) Signed: 09/13/2020 10:23:49 AM By: Worthy Keeler PA-C Entered By: Worthy Keeler on 09/13/2020 10:23:49 [name] ([id number]) -------------------------------------------------------------------------------- Physician Orders Details Patient Name: [name] Date of  Service: 09/13/2020 10:00 AM Medical Record Number: [id number] Patient Account Number: [id number] Date of Birth/Sex: 1946-03-07 (74 y.o. M) Treating RN: Cornell Barman Primary Care Provider: Otilio Miu Other Clinician: Referring Provider: Otilio Miu Treating Provider/Extender: Skipper Cliche in Treatment: 2 Verbal / Phone Orders: No Diagnosis Coding ICD-10 Coding Code Description I87.2 Venous insufficiency (chronic) (peripheral) L97.822 Non-pressure chronic ulcer of other part of left lower leg with fat layer exposed [place] (primary) hypertension R73.9 Hyperglycemia, unspecified Follow-up Appointments o Return Appointment in 1 week. Bathing/ Shower/ Hygiene o Clean wound with Normal Saline or wound cleanser. Edema Control - Lymphedema / Segmental Compressive Device / Other Wound #1 Left,Lateral Lower Leg o Elevate legs to the level of the heart and pump ankles as often as possible o Elevate leg(s) parallel to the floor when sitting. o Other: - Tubi grip E Wound Treatment Wound #1 - Lower Leg Wound Laterality: Left, Lateral Cleanser: Byram Ancillary Kit - 15 Day Supply (Generic) 3 x Per Week/30 Days Discharge Instructions: Use supplies as instructed; Kit contains: (15) Saline Bullets; (15) 3x3 Gauze; 15 pr Gloves Cleanser: Normal Saline (Generic) 3 x Per Week/30 Days Discharge Instructions: Wash your hands with soap and water. Remove old dressing, discard into plastic bag and place into trash. Cleanse the wound with Normal Saline prior to applying a clean dressing using gauze sponges, not tissues or cotton balls. Do not scrub or use excessive force. Pat dry  using gauze sponges, not tissue or cotton balls. Primary Dressing: Hydrofera Blue Ready Transfer Foam, 2.5x2.5 (in/in) (Generic) 3 x Per Week/30 Days Discharge Instructions: Apply Hydrofera Blue Ready to wound bed as directed Secondary Dressing: Conforming Guaze Roll-Large (Generic) 3 x Per Week/30 Days Discharge Instructions: Apply Conforming Stretch Guaze Bandage as directed Secured With: Tubigrip Size E, 3.5x10 (in/yds) (Generic) 3 x Per Week/30 Days Discharge Instructions: Apply 3 Tubigrip E 3-finger-widths below knee to base of toes to secure dressing and/or for swelling. Electronic Signature(s) Signed: 09/13/2020 4:46:05 PM By: Worthy Keeler PA-C Signed: 09/14/2020 9:03:26 PM By: Gretta Cool BSN, RN, CWS, Kim RN, BSN Entered By: Gretta Cool, BSN, RN, CWS, Kim on 09/13/2020 10:18:35 [name] ([id number]) -------------------------------------------------------------------------------- Problem List Details Patient Name: [name] Date of Service: 09/13/2020 10:00 AM Medical Record Number: [id number] Patient Account Number: [id number] Date of Birth/Sex: 1945-10-11 (74 y.o. M) Treating RN: Carlene Coria Primary Care Provider: Otilio Miu Other Clinician: Referring Provider: Otilio Miu Treating Provider/Extender: Skipper Cliche in Treatment: 2 Active Problems ICD-10 Encounter Code Description Active Date MDM Diagnosis I87.2 Venous insufficiency (chronic) (peripheral) 08/30/2020 No Yes L97.822 Non-pressure chronic ulcer of other part of left lower leg with fat layer 08/30/2020 No Yes exposed [place] (primary) hypertension 08/30/2020 No Yes R73.9 Hyperglycemia, unspecified 08/30/2020 No Yes Inactive Problems Resolved Problems Electronic Signature(s) Signed: 09/13/2020 10:15:24 AM By: Worthy Keeler PA-C Entered By: Worthy Keeler on 09/13/2020 10:15:23 [name] ([id number]) -------------------------------------------------------------------------------- Progress Note  Details Patient Name: [name] Date of Service: 09/13/2020 10:00 AM Medical Record Number: [id number] Patient Account Number: [id number] Date of Birth/Sex: 23-Mar-1946 (74 y.o. M) Treating RN: Carlene Coria Primary Care Provider: Otilio Miu Other Clinician: Referring Provider: Otilio Miu Treating Provider/Extender: Skipper Cliche in Treatment: 2 Subjective Chief Complaint Information obtained from Patient Left LE Ulcer History of Present Illness (HPI) 08/30/2020 upon evaluation today patient presents today for inspection regarding a wound on his left lower extremity. He does have evidence of some chronic venous insufficiency and hemosiderin staining. With that being said he tells me  this began after his stroke though he does not appear to be too swollen today this is good news. Fortunately there is no evidence of active infection at this time. No fevers, chills, nausea, vomiting, or diarrhea. He does have a history of hypertension and occasional elevated blood sugar levels although he does not have a formal diagnosis of diabetes according to what he tells me today. 09/06/2020 upon evaluation today patient appears to be doing well with regard to his leg ulcer. I do feel like the Tubigrip has helped and I do feel like as well but the patient seems to be making good progress all things considered. In the past week this has measured better and seems to be healing quite well that is excellent news. Overall I am extremely pleased at this point. 09/13/2020 upon evaluation today patient appears to be doing well with regard to his wound. He has been tolerating the dressing changes with Hydrofera Blue without complication the wound bed appears to be doing significantly better which is great news. Overall I'm extremely pleased with where things stand. I do believe he still has a little bit of time to get this to heal just due to the overall size of the wound but nonetheless I think we are headed  in the appropriate direction making good progress based on what I'm seeing. Objective Constitutional Well-nourished and well-hydrated in no acute distress. Vitals Time Taken: 10:07 AM, Height: 71 in, Weight: 205 lbs, BMI: 28.6, Temperature: 97.5 F, Pulse: 69 bpm, Respiratory Rate: 16 breaths/min, Blood Pressure: 157/70 mmHg. Respiratory normal breathing without difficulty. Psychiatric this patient is able to make decisions and demonstrates good insight into disease process. Alert and Oriented x 3. pleasant and cooperative. General Notes: Upon inspection patient's wound bed did not require sharp debridement at this point which is great news. With that being said I do feel like that he is tolerating the dressing changes without complication overall I'm extremely pleased with where things stand today. Integumentary (Hair, Skin) Wound #1 status is Open. Original cause of wound was Skin Tear/Laceration. The wound is located on the Left,Lateral Lower Leg. The wound measures 2.2cm length x 3.5cm width x 0.1cm depth; 6.048cm^2 area and 0.605cm^3 volume. There is Fat Layer (Subcutaneous Tissue) exposed. There is no tunneling or undermining noted. There is a medium amount of serosanguineous drainage noted. The wound margin is flat and intact. There is large (67-100%) red granulation within the wound bed. There is a small (1-33%) amount of necrotic tissue within the wound bed including Adherent Slough. Assessment Active Problems ICD-10 PETRA, SARGEANT ([id number]) Venous insufficiency (chronic) (peripheral) Non-pressure chronic ulcer of other part of left lower leg with fat layer exposed [place] (primary) hypertension Hyperglycemia, unspecified Plan Follow-up Appointments: Return Appointment in 1 week. Bathing/ Shower/ Hygiene: Clean wound with Normal Saline or wound cleanser. Edema Control - Lymphedema / Segmental Compressive Device / Other: Wound #1 Left,Lateral Lower Leg: Elevate legs to  the level of the heart and pump ankles as often as possible Elevate leg(s) parallel to the floor when sitting. Other: - Tubi grip E WOUND #1: - Lower Leg Wound Laterality: Left, Lateral Cleanser: Byram Ancillary Kit - 15 Day Supply (Generic) 3 x Per Week/30 Days Discharge Instructions: Use supplies as instructed; Kit contains: (15) Saline Bullets; (15) 3x3 Gauze; 15 pr Gloves Cleanser: Normal Saline (Generic) 3 x Per Week/30 Days Discharge Instructions: Wash your hands with soap and water. Remove old dressing, discard into plastic bag and place into trash. Cleanse the wound  with Normal Saline prior to applying a clean dressing using gauze sponges, not tissues or cotton balls. Do not scrub or use excessive force. Pat dry using gauze sponges, not tissue or cotton balls. Primary Dressing: Hydrofera Blue Ready Transfer Foam, 2.5x2.5 (in/in) (Generic) 3 x Per Week/30 Days Discharge Instructions: Apply Hydrofera Blue Ready to wound bed as directed Secondary Dressing: Conforming Guaze Roll-Large (Generic) 3 x Per Week/30 Days Discharge Instructions: Apply Conforming Stretch Guaze Bandage as directed Secured With: Tubigrip Size E, 3.5x10 (in/yds) (Generic) 3 x Per Week/30 Days Discharge Instructions: Apply 3 Tubigrip E 3-finger-widths below knee to base of toes to secure dressing and/or for swelling. 1. Would recommend currently that we go and continue with the wound care measures as before and the patient is in agreement with the plan. This includes the use of Hydrofera Blue which I think has done well up to this point. 2. Also can use the Tubigrip as well although we'll go up slightly larger size due to the fact that he tells me this has been somewhat difficult to get off. He states he will see how this goes over the next week. We will see patient back for reevaluation in 1 week here in the clinic. If anything worsens or changes patient will contact our office for  additional recommendations. Electronic Signature(s) Signed: 09/13/2020 10:24:57 AM By: Worthy Keeler PA-C Entered By: Worthy Keeler on 09/13/2020 10:24:56 [name] ([id number]) -------------------------------------------------------------------------------- SuperBill Details Patient Name: [name] Date of Service: 09/13/2020 Medical Record Number: [id number] Patient Account Number: [id number] Date of Birth/Sex: 08-22-1945 (74 y.o. M) Treating RN: Cornell Barman Primary Care Provider: Otilio Miu Other Clinician: Referring Provider: Otilio Miu Treating Provider/Extender: Jeri Cos Weeks in Treatment: 2 Diagnosis Coding ICD-10 Codes Code Description I87.2 Venous insufficiency (chronic) (peripheral) L97.822 Non-pressure chronic ulcer of other part of left lower leg with fat layer exposed [place] (primary) hypertension R73.9 Hyperglycemia, unspecified Facility Procedures CPT4 Code: [id number] Description: 99213 - WOUND CARE VISIT-LEV 3 EST PT Modifier: Quantity: 1 Physician Procedures CPT4 Code: 1017510 Description: 25852 - WC PHYS LEVEL 3 - EST PT Modifier: Quantity: 1 CPT4 Code: Description: ICD-10 Diagnosis Description I87.2 Venous insufficiency (chronic) (peripheral) L97.822 Non-pressure chronic ulcer of other part of left lower leg with fat lay I10 Essential (primary) hypertension R73.9 Hyperglycemia, unspecified Modifier: er exposed Quantity: Electronic Signature(s) Signed: 09/13/2020 10:48:41 AM By: Worthy Keeler PA-C Entered By: Worthy Keeler on 09/13/2020 10:48:41

## 2020-09-15 NOTE — Progress Notes (Signed)
[name] ([id number]) Visit Report for 09/13/2020 Arrival Information Details Patient Name: [name], [name] Date of Service: 09/13/2020 10:00 AM Medical Record Number: [id number] Patient Account Number: [id number] Date of Birth/Sex: 04-Jul-1946 (75 y.o. M) Treating RN: Cornell Barman Primary Care Lucion Dilger: Otilio Miu Other Clinician: Referring Derrika Ruffalo: Otilio Miu Treating Christl Fessenden/Extender: Skipper Cliche in Treatment: 2 Visit Information History Since Last Visit Pain Present Now: No Patient Arrived: Cane Arrival Time: 10:07 Accompanied By: self Transfer Assistance: None Patient Identification Verified: Yes Secondary Verification Process Completed: Yes Electronic Signature(s) Signed: 09/14/2020 9:03:26 PM By: Gretta Cool, BSN, RN, CWS, Kim RN, BSN Entered By: Gretta Cool, BSN, RN, CWS, Kim on 09/13/2020 10:07:53 [name] ([id number]) -------------------------------------------------------------------------------- Clinic Level of Care Assessment Details Patient Name: [name] Date of Service: 09/13/2020 10:00 AM Medical Record Number: [id number] Patient Account Number: [id number] Date of Birth/Sex: 12-01-1945 (74 y.o. M) Treating RN: Cornell Barman Primary Care Addylin Manke: Otilio Miu Other Clinician: Referring Terran Hollenkamp: Otilio Miu Treating Charelle Petrakis/Extender: Skipper Cliche in Treatment: 2 Clinic Level of Care Assessment Items TOOL 4 Quantity Score X - Use when only an EandM is performed on FOLLOW-UP visit 1 0 ASSESSMENTS - Nursing Assessment / Reassessment X - Reassessment of Co-morbidities (includes updates in patient status) 1 10 X- 1 5 Reassessment of Adherence to Treatment Plan ASSESSMENTS - Wound and Skin Assessment / Reassessment X - Simple Wound Assessment / Reassessment - one wound 1 5 _0  - 0 Complex Wound Assessment / Reassessment - multiple wounds _1  - 0 Dermatologic / Skin Assessment (not related to wound area) ASSESSMENTS - Focused Assessment _2  -  Circumferential Edema Measurements - multi extremities 0 _3  - 0 Nutritional Assessment / Counseling / Intervention _4  - 0 Lower Extremity Assessment (monofilament, tuning fork, pulses) _5  - 0 Peripheral Arterial Disease Assessment (using hand held doppler) ASSESSMENTS - Ostomy and/or Continence Assessment and Care _6  - Incontinence Assessment and Management 0 _7  - 0 Ostomy Care Assessment and Management (repouching, etc.) PROCESS - Coordination of Care X - Simple Patient / Family Education for ongoing care 1 15 _8  - 0 Complex (extensive) Patient / Family Education for ongoing care X- 1 10 Staff obtains Programmer, systems, Records, Test Results / Process Orders _9  - 0 Staff telephones HHA, Nursing Homes / Clarify orders / etc _10  - 0 Routine Transfer to another Facility (non-emergent condition) _11  - 0 Routine Hospital Admission (non-emergent condition) _12  - 0 New Admissions / Biomedical engineer / Ordering NPWT, Apligraf, etc. _13  - 0 Emergency Hospital Admission (emergent condition) X- 1 10 Simple Discharge Coordination _14  - 0 Complex (extensive) Discharge Coordination PROCESS - Special Needs _15  - Pediatric / Minor Patient Management 0 _16  - 0 Isolation Patient Management _17  - 0 Hearing / Language / Visual special needs _18  - 0 Assessment of Community assistance (transportation, D/C planning, etc.) _19  - 0 Additional assistance / Altered mentation _20  - 0 Support Surface(s) Assessment (bed, cushion, seat, etc.) INTERVENTIONS - Wound Cleansing / Measurement Carpenito, Kush ([id number]) X- 1 5 Simple Wound Cleansing - one wound _21  - 0 Complex Wound Cleansing - multiple wounds X- 1 5 Wound Imaging (photographs - any number of wounds) _22  - 0 Wound Tracing (instead of photographs) X- 1 5 Simple Wound Measurement - one wound _23  - 0 Complex Wound Measurement - multiple wounds INTERVENTIONS - Wound Dressings _24  - Small Wound Dressing one or multiple wounds 0 X- 1 15 Medium  Wound Dressing one or multiple wounds _25  - 0 Large Wound Dressing one or multiple wounds X- 1 5 Application  of Medications - topical <[id number]>_9<\/[id number]>_1  - 0 Application of Medications - injection INTERVENTIONS - Miscellaneous _1  - External ear exam 0 _2  - 0 Specimen Collection (cultures, biopsies, blood, body fluids, etc.) _3  - 0 Specimen(s) / Culture(s) sent or taken to Lab for analysis _4  - 0 Patient Transfer (multiple staff / Harrel Lemon Lift / Similar devices) _5  - 0 Simple Staple / Suture removal (25 or less) _6  - 0 Complex Staple / Suture removal (26 or more) _7  - 0 Hypo / Hyperglycemic Management (close monitor of Blood Glucose) _8  - 0 Ankle / Brachial Index (ABI) - do not check if billed separately X- 1 5 Vital Signs Has the patient been seen at the hospital within the last three years: Yes Total Score: 95 Level Of Care: New/Established - Level 3 Electronic Signature(s) Signed: 09/14/2020 9:03:26 PM By: Gretta Cool, BSN, RN, CWS, Kim RN, BSN Entered By: Gretta Cool, BSN, RN, CWS, Kim on 09/13/2020 10:19:11 [name] ([id number]) -------------------------------------------------------------------------------- Lower Extremity Assessment Details Patient Name: [name] Date of Service: 09/13/2020 10:00 AM Medical Record Number: [id number] Patient Account Number: [id number] Date of Birth/Sex: August 18, 1945 (74 y.o. M) Treating RN: Cornell Barman Primary Care Isamu Trammel: Otilio Miu Other Clinician: Referring Analei Whinery: Otilio Miu Treating Aleem Elza/Extender: Jeri Cos Weeks in Treatment: 2 Edema Assessment Assessed: [Left: No] [Right: No] [Left: Edema] [Right: :] Calf Left: Right: Point of Measurement: From Medial Instep 37 cm Ankle Left: Right: Point of Measurement: From Medial Instep 24 cm Electronic Signature(s) Signed: 09/14/2020 9:03:26 PM By: Gretta Cool, BSN, RN, CWS, Kim RN, BSN Entered By: Gretta Cool, BSN, RN, CWS, Kim on 09/13/2020 10:11:11 [name]  ([id number]) -------------------------------------------------------------------------------- Multi Wound Chart Details Patient Name: [name] Date of Service: 09/13/2020 10:00 AM Medical Record Number: [id number] Patient Account Number: [id number] Date of Birth/Sex: [birth year] (74 y.o. M) Treating RN: Cornell Barman Primary Care Jozee Hammer: Otilio Miu Other Clinician: Referring Kache Mcclurg: Otilio Miu Treating Patrese Neal/Extender: Jeri Cos Weeks in Treatment: 2 Vital Signs Height(in): 71 Pulse(bpm): 82 Weight(lbs): 205 Blood Pressure(mmHg): 157/70 Body Mass Index(BMI): 29 Temperature(F): 97.5 Respiratory Rate(breaths/min): 16 Photos: [N/A:N/A] Wound Location: Left, Lateral Lower Leg N/A N/A Wounding Event: Skin Tear/Laceration N/A N/A Primary Etiology: Trauma, Other N/A N/A Comorbid History: Human Immunodeficiency Virus, N/A N/A Hypertension Date Acquired: 07/30/2020 N/A N/A Weeks of Treatment: 2 N/A N/A Wound Status: Open N/A N/A Measurements L x W x D (cm) 2.2x3.5x0.1 N/A N/A Area (cm) : 6.048 N/A N/A Volume (cm) : 0.605 N/A N/A % Reduction in Area: 24.90% N/A N/A % Reduction in Volume: 24.80% N/A N/A Classification: Full Thickness Without Exposed N/A N/A Support Structures Exudate Amount: Medium N/A N/A Exudate Type: Serosanguineous N/A N/A Exudate Color: red, brown N/A N/A Wound Margin: Flat and Intact N/A N/A Granulation Amount: Large (67-100%) N/A N/A Granulation Quality: Red N/A N/A Necrotic Amount: Small (1-33%) N/A N/A Exposed Structures: Fat Layer (Subcutaneous Tissue): N/A N/A Yes Fascia: No Tendon: No Muscle: No Joint: No Bone: No Epithelialization: None N/A N/A Treatment Notes Electronic Signature(s) Signed: 09/14/2020 9:03:26 PM By: Gretta Cool, BSN, RN, CWS, Kim RN, BSN Entered By: Gretta Cool, BSN, RN, CWS, Kim on 09/13/2020 10:16:08 [name]  ([id number]) -------------------------------------------------------------------------------- Multi-Disciplinary Care Plan Details Patient Name: [name] Date of Service: 09/13/2020 10:00 AM Medical Record Number: [id number] Patient Account Number: [id number] Date of Birth/Sex: 1945-08-08 (74 y.o. M) Treating RN: Cornell Barman Primary Care Dolora Ridgely: Otilio Miu Other Clinician: Referring Antino Mayabb: Otilio Miu Treating Emira Eubanks/Extender: Jeri Cos Weeks in Treatment: 2 Active Inactive Venous Leg Ulcer Nursing Diagnoses: Actual venous Insuffiency (use after diagnosis  is confirmed) Goals: Patient will maintain optimal edema control Date Initiated: 08/30/2020 Target Resolution Date: 09/27/2020 Goal Status: Active Verify adequate tissue perfusion prior to therapeutic compression application Date Initiated: 08/30/2020 Date Inactivated: 09/06/2020 Target Resolution Date: 08/30/2020 Goal Status: Met Interventions: Assess peripheral edema status every visit. Compression as ordered Provide education on venous insufficiency Notes: Wound/Skin Impairment Nursing Diagnoses: Impaired tissue integrity Goals: Ulcer/skin breakdown will have a volume reduction of 30% by week 4 Date Initiated: 08/30/2020 Target Resolution Date: 09/27/2020 Goal Status: Active Ulcer/skin breakdown will have a volume reduction of 50% by week 8 Date Initiated: 08/30/2020 Target Resolution Date: 10/11/2020 Goal Status: Active Interventions: Assess patient/caregiver ability to obtain necessary supplies Assess patient/caregiver ability to perform ulcer/skin care regimen upon admission and as needed Assess ulceration(s) every visit Treatment Activities: Skin care regimen initiated : 08/30/2020 Topical wound management initiated : 08/30/2020 Notes: Electronic Signature(s) Signed: 09/14/2020 9:03:26 PM By: Gretta Cool, BSN, RN, CWS, Kim RN, BSN Entered By: Gretta Cool, BSN, RN, CWS, Kim on 09/13/2020 10:16:00 [name]  ([id number]) -------------------------------------------------------------------------------- Pain Assessment Details Patient Name: [name] Date of Service: 09/13/2020 10:00 AM Medical Record Number: [id number] Patient Account Number: [id number] Date of Birth/Sex: [birth year] (74 y.o. M) Treating RN: Cornell Barman Primary Care Masson Nalepa: Otilio Miu Other Clinician: Referring Sharmain Lastra: Otilio Miu Treating Manning Luna/Extender: Jeri Cos Weeks in Treatment: 2 Active Problems Location of Pain Severity and Description of Pain Patient Has Paino No Site Locations Pain Management and Medication Current Pain Management: Electronic Signature(s) Signed: 09/14/2020 9:03:26 PM By: Gretta Cool, BSN, RN, CWS, Kim RN, BSN Entered By: Gretta Cool, BSN, RN, CWS, Kim on 09/13/2020 10:08:58 [name] ([id number]) -------------------------------------------------------------------------------- Patient/Caregiver Education Details Patient Name: [name] Date of Service: 09/13/2020 10:00 AM Medical Record Number: [id number] Patient Account Number: [id number] Date of Birth/Gender: 11-11-1945 (74 y.o. M) Treating RN: Cornell Barman Primary Care Physician: Otilio Miu Other Clinician: Referring Physician: Otilio Miu Treating Physician/Extender: Skipper Cliche in Treatment: 2 Education Assessment Education Provided To: Patient Education Topics Provided Venous: Methods: Explain/Verbal Responses: State content correctly Electronic Signature(s) Signed: 09/14/2020 9:03:26 PM By: Gretta Cool, BSN, RN, CWS, Kim RN, BSN Entered By: Gretta Cool, BSN, RN, CWS, Kim on 09/13/2020 10:19:29 [name] ([id number]) -------------------------------------------------------------------------------- Wound Assessment Details Patient Name: [name] Date of Service: 09/13/2020 10:00 AM Medical Record Number: [id number] Patient Account Number: [id number] Date of Birth/Sex: 1946-04-13 (74 y.o. M) Treating RN: Cornell Barman Primary Care Martine Bleecker: Otilio Miu Other Clinician: Referring Veronia Laprise: Otilio Miu Treating Donye Dauenhauer/Extender: Jeri Cos Weeks in Treatment: 2 Wound Status Wound Number: 1 Primary Etiology: Trauma, Other Wound Location: Left, Lateral Lower Leg Wound Status: Open Wounding Event: Skin Tear/Laceration Comorbid History: Human Immunodeficiency Virus, Hypertension Date Acquired: 07/30/2020 Weeks Of Treatment: 2 Clustered Wound: No Photos Wound Measurements Length: (cm) 2.2 Width: (cm) 3.5 Depth: (cm) 0.1 Area: (cm) 6.048 Volume: (cm) 0.605 % Reduction in Area: 24.9% % Reduction in Volume: 24.8% Epithelialization: None Tunneling: No Undermining: No Wound Description Classification: Full Thickness Without Exposed Support Structu Wound Margin: Flat and Intact Exudate Amount: Medium Exudate Type: Serosanguineous Exudate Color: red, brown res Foul Odor After Cleansing: No Slough/Fibrino Yes Wound Bed Granulation Amount: Large (67-100%) Exposed Structure Granulation Quality: Red Fascia Exposed: No Necrotic Amount: Small (1-33%) Fat Layer (Subcutaneous Tissue) Exposed: Yes Necrotic Quality: Adherent Slough Tendon Exposed: No Muscle Exposed: No Joint Exposed: No Bone Exposed: No Electronic Signature(s) Signed: 09/14/2020 9:03:26 PM By: Gretta Cool, BSN, RN, CWS, Kim RN, BSN Entered By: Gretta Cool, BSN, RN, CWS, Kim on 09/13/2020 10:13:00 [name] ([id number]) -------------------------------------------------------------------------------- Vitals  Details Patient Name: JACOBE, STUDY Date of Service: 09/13/2020 10:00 AM Medical Record Number: [id number] Patient Account Number: [id number] Date of Birth/Sex: Aug 15, 1945 (75 y.o. M) Treating RN: Cornell Barman Primary Care Lyrical Sowle: Otilio Miu Other Clinician: Referring Harshitha Fretz: Otilio Miu Treating Yariah Selvey/Extender: Jeri Cos Weeks in Treatment: 2 Vital Signs Time Taken: 10:07 Temperature (F): 97.5 Height (in):  71 Pulse (bpm): 69 Weight (lbs): 205 Respiratory Rate (breaths/min): 16 Body Mass Index (BMI): 28.6 Blood Pressure (mmHg): 157/70 Reference Range: 80 - 120 mg / dl Electronic Signature(s) Signed: 09/14/2020 9:03:26 PM By: Gretta Cool, BSN, RN, CWS, Kim RN, BSN Entered By: Gretta Cool, BSN, RN, CWS, Kim on 09/13/2020 10:08:51

## 2020-09-20 ENCOUNTER — Encounter: Payer: Medicare Other | Admitting: Physician Assistant

## 2020-09-20 ENCOUNTER — Other Ambulatory Visit: Payer: Self-pay | Admitting: Family Medicine

## 2020-09-20 ENCOUNTER — Other Ambulatory Visit: Payer: Self-pay

## 2020-09-20 DIAGNOSIS — L97822 Non-pressure chronic ulcer of other part of left lower leg with fat layer exposed: Secondary | ICD-10-CM | POA: Diagnosis not present

## 2020-09-20 DIAGNOSIS — I872 Venous insufficiency (chronic) (peripheral): Secondary | ICD-10-CM | POA: Diagnosis not present

## 2020-09-20 DIAGNOSIS — I10 Essential (primary) hypertension: Secondary | ICD-10-CM | POA: Diagnosis not present

## 2020-09-20 DIAGNOSIS — R739 Hyperglycemia, unspecified: Secondary | ICD-10-CM | POA: Diagnosis not present

## 2020-09-20 DIAGNOSIS — J301 Allergic rhinitis due to pollen: Secondary | ICD-10-CM

## 2020-09-20 NOTE — Progress Notes (Addendum)
[name], [name] ([id number]) Visit Report for 09/20/2020 Chief Complaint Document Details Patient Name: [name] Date of Service: 09/20/2020 10:00 AM Medical Record Number: [id number] Patient Account Number: [id number] Date of Birth/Sex: 10-Jun-1946 (75 y.o. M) Treating RN: Carlene Coria Primary Care Provider: Otilio Miu Other Clinician: Referring Provider: Otilio Miu Treating Provider/Extender: Skipper Cliche in Treatment: 3 Information Obtained from: Patient Chief Complaint Left LE Ulcer Electronic Signature(s) Signed: 09/20/2020 10:01:55 AM By: Worthy Keeler PA-C Entered By: Worthy Keeler on 09/20/2020 10:01:55 [name] ([id number]) -------------------------------------------------------------------------------- HPI Details Patient Name: [name] Date of Service: 09/20/2020 10:00 AM Medical Record Number: [id number] Patient Account Number: [id number] Date of Birth/Sex: 07/24/1946 (74 y.o. M) Treating RN: Carlene Coria Primary Care Provider: Otilio Miu Other Clinician: Referring Provider: Otilio Miu Treating Provider/Extender: Skipper Cliche in Treatment: 3 History of Present Illness HPI Description: 08/30/2020 upon evaluation today patient presents today for inspection regarding a wound on his left lower extremity. He does have evidence of some chronic venous insufficiency and hemosiderin staining. With that being said he tells me this began after his stroke though he does not appear to be too swollen today this is good news. Fortunately there is no evidence of active infection at this time. No fevers, chills, nausea, vomiting, or diarrhea. He does have a history of hypertension and occasional elevated blood sugar levels although he does not have a formal diagnosis of diabetes according to what he tells me today. 09/06/2020 upon evaluation today patient appears to be doing well with regard to his leg ulcer. I do feel like the Tubigrip has helped and  I do feel like as well but the patient seems to be making good progress all things considered. In the past week this has measured better and seems to be healing quite well that is excellent news. Overall I am extremely pleased at this point. 09/13/2020 upon evaluation today patient appears to be doing well with regard to his wound. He has been tolerating the dressing changes with Hydrofera Blue without complication the wound bed appears to be doing significantly better which is great news. Overall I'm extremely pleased with where things stand. I do believe he still has a little bit of time to get this to heal just due to the overall size of the wound but nonetheless I think we are headed in the appropriate direction making good progress based on what I'm seeing. 09/20/2020 upon evaluation today patient appears to be doing well with regard to his leg ulcer. He has been tolerating the dressing changes without complication. Fortunately there is no signs of active infection at this time. No fevers, chills, nausea, vomiting, or diarrhea. Electronic Signature(s) Signed: 09/20/2020 2:18:10 PM By: Worthy Keeler PA-C Entered By: Worthy Keeler on 09/20/2020 14:18:10 [name] ([id number]) -------------------------------------------------------------------------------- Physical Exam Details Patient Name: [name] Date of Service: 09/20/2020 10:00 AM Medical Record Number: [id number] Patient Account Number: [id number] Date of Birth/Sex: 05-01-1946 (74 y.o. M) Treating RN: Carlene Coria Primary Care Provider: Otilio Miu Other Clinician: Referring Provider: Otilio Miu Treating Provider/Extender: Jeri Cos Weeks in Treatment: 3 Constitutional Well-nourished and well-hydrated in no acute distress. Respiratory normal breathing without difficulty. Psychiatric this patient is able to make decisions and demonstrates good insight into disease process. Alert and Oriented x 3. pleasant and  cooperative. Notes Upon inspection patient's wound bed actually showed signs of good granulation epithelization. I overall feel like he is making good progress and there does not appear to be signs of active infection  at this time. Electronic Signature(s) Signed: 09/20/2020 2:18:24 PM By: Worthy Keeler PA-C Entered By: Worthy Keeler on 09/20/2020 14:18:24 [name] ([id number]) -------------------------------------------------------------------------------- Physician Orders Details Patient Name: [name] Date of Service: 09/20/2020 10:00 AM Medical Record Number: [id number] Patient Account Number: [id number] Date of Birth/Sex: March 06, 1946 (74 y.o. M) Treating RN: Carlene Coria Primary Care Provider: Otilio Miu Other Clinician: Referring Provider: Otilio Miu Treating Provider/Extender: Skipper Cliche in Treatment: 3 Verbal / Phone Orders: No Diagnosis Coding ICD-10 Coding Code Description I87.2 Venous insufficiency (chronic) (peripheral) L97.822 Non-pressure chronic ulcer of other part of left lower leg with fat layer exposed [place] (primary) hypertension R73.9 Hyperglycemia, unspecified Follow-up Appointments o Return Appointment in 1 week. Bathing/ Shower/ Hygiene o Clean wound with Normal Saline or wound cleanser. Edema Control - Lymphedema / Segmental Compressive Device / Other Wound #1 Left,Lateral Lower Leg o Elevate legs to the level of the heart and pump ankles as often as possible o Elevate leg(s) parallel to the floor when sitting. o Other: - Tubi grip E Wound Treatment Wound #1 - Lower Leg Wound Laterality: Left, Lateral Cleanser: Byram Ancillary Kit - 15 Day Supply (Generic) 3 x Per Week/30 Days Discharge Instructions: Use supplies as instructed; Kit contains: (15) Saline Bullets; (15) 3x3 Gauze; 15 pr Gloves Cleanser: Normal Saline (Generic) 3 x Per Week/30 Days Discharge Instructions: Wash your hands with soap and water. Remove  old dressing, discard into plastic bag and place into trash. Cleanse the wound with Normal Saline prior to applying a clean dressing using gauze sponges, not tissues or cotton balls. Do not scrub or use excessive force. Pat dry using gauze sponges, not tissue or cotton balls. Primary Dressing: Hydrofera Blue Ready Transfer Foam, 2.5x2.5 (in/in) (Generic) 3 x Per Week/30 Days Discharge Instructions: Apply Hydrofera Blue Ready to wound bed as directed Secondary Dressing: ABD Pad 5x9 (in/in) (Generic) 3 x Per Week/30 Days Discharge Instructions: Cover with ABD pad Secondary Dressing: Conforming Guaze Roll-Large (Generic) 3 x Per Week/30 Days Discharge Instructions: Apply Conforming Stretch Guaze Bandage as directed Secured With: Tubigrip Size E, 3.5x10 (in/yds) (DME) (Generic) 3 x Per Week/30 Days Discharge Instructions: Apply 3 Tubigrip E 3-finger-widths below knee to base of toes to secure dressing and/or for swelling. Electronic Signature(s) Signed: 09/20/2020 4:08:54 PM By: Carlene Coria RN Signed: 09/21/2020 9:55:27 AM By: Worthy Keeler PA-C Entered By: Carlene Coria on 09/20/2020 10:37:50 [name] ([id number]) -------------------------------------------------------------------------------- Problem List Details Patient Name: [name] Date of Service: 09/20/2020 10:00 AM Medical Record Number: [id number] Patient Account Number: [id number] Date of Birth/Sex: 1946-03-11 (75 y.o. M) Treating RN: Carlene Coria Primary Care Provider: Otilio Miu Other Clinician: Referring Provider: Otilio Miu Treating Provider/Extender: Skipper Cliche in Treatment: 3 Active Problems ICD-10 Encounter Code Description Active Date MDM Diagnosis I87.2 Venous insufficiency (chronic) (peripheral) 08/30/2020 No Yes L97.822 Non-pressure chronic ulcer of other part of left lower leg with fat layer 08/30/2020 No Yes exposed [place] (primary) hypertension 08/30/2020 No Yes R73.9 Hyperglycemia,  unspecified 08/30/2020 No Yes Inactive Problems Resolved Problems Electronic Signature(s) Signed: 09/20/2020 10:01:49 AM By: Worthy Keeler PA-C Entered By: Worthy Keeler on 09/20/2020 10:01:48 [name] ([id number]) -------------------------------------------------------------------------------- Progress Note Details Patient Name: [name] Date of Service: 09/20/2020 10:00 AM Medical Record Number: [id number] Patient Account Number: [id number] Date of Birth/Sex: 1946/05/17 (74 y.o. M) Treating RN: Carlene Coria Primary Care Provider: Otilio Miu Other Clinician: Referring Provider: Otilio Miu Treating Provider/Extender: Skipper Cliche in Treatment: 3 Subjective Chief Complaint Information  obtained from Patient Left LE Ulcer History of Present Illness (HPI) 08/30/2020 upon evaluation today patient presents today for inspection regarding a wound on his left lower extremity. He does have evidence of some chronic venous insufficiency and hemosiderin staining. With that being said he tells me this began after his stroke though he does not appear to be too swollen today this is good news. Fortunately there is no evidence of active infection at this time. No fevers, chills, nausea, vomiting, or diarrhea. He does have a history of hypertension and occasional elevated blood sugar levels although he does not have a formal diagnosis of diabetes according to what he tells me today. 09/06/2020 upon evaluation today patient appears to be doing well with regard to his leg ulcer. I do feel like the Tubigrip has helped and I do feel like as well but the patient seems to be making good progress all things considered. In the past week this has measured better and seems to be healing quite well that is excellent news. Overall I am extremely pleased at this point. 09/13/2020 upon evaluation today patient appears to be doing well with regard to his wound. He has been tolerating the dressing  changes with Hydrofera Blue without complication the wound bed appears to be doing significantly better which is great news. Overall I'm extremely pleased with where things stand. I do believe he still has a little bit of time to get this to heal just due to the overall size of the wound but nonetheless I think we are headed in the appropriate direction making good progress based on what I'm seeing. 09/20/2020 upon evaluation today patient appears to be doing well with regard to his leg ulcer. He has been tolerating the dressing changes without complication. Fortunately there is no signs of active infection at this time. No fevers, chills, nausea, vomiting, or diarrhea. Objective Constitutional Well-nourished and well-hydrated in no acute distress. Vitals Time Taken: 10:05 AM, Height: 71 in, Weight: 205 lbs, BMI: 28.6, Temperature: 97.8 F, Pulse: 66 bpm, Respiratory Rate: 16 breaths/min, Blood Pressure: 151/76 mmHg. Respiratory normal breathing without difficulty. Psychiatric this patient is able to make decisions and demonstrates good insight into disease process. Alert and Oriented x 3. pleasant and cooperative. General Notes: Upon inspection patient's wound bed actually showed signs of good granulation epithelization. I overall feel like he is making good progress and there does not appear to be signs of active infection at this time. Integumentary (Hair, Skin) Wound #1 status is Open. Original cause of wound was Skin Tear/Laceration. The wound is located on the Left,Lateral Lower Leg. The wound measures 2.2cm length x 3.8cm width x 0.1cm depth; 6.566cm^2 area and 0.657cm^3 volume. There is Fat Layer (Subcutaneous Tissue) exposed. There is no tunneling or undermining noted. There is a medium amount of serosanguineous drainage noted. The wound margin is flat and intact. There is large (67-100%) red granulation within the wound bed. There is no necrotic tissue within the wound  bed. Assessment LAVANCE, BEAZER ([id number]) Active Problems ICD-10 Venous insufficiency (chronic) (peripheral) Non-pressure chronic ulcer of other part of left lower leg with fat layer exposed [place] (primary) hypertension Hyperglycemia, unspecified Plan Follow-up Appointments: Return Appointment in 1 week. Bathing/ Shower/ Hygiene: Clean wound with Normal Saline or wound cleanser. Edema Control - Lymphedema / Segmental Compressive Device / Other: Wound #1 Left,Lateral Lower Leg: Elevate legs to the level of the heart and pump ankles as often as possible Elevate leg(s) parallel to the floor when sitting. Other: -  Tubi grip E WOUND #1: - Lower Leg Wound Laterality: Left, Lateral Cleanser: Byram Ancillary Kit - 15 Day Supply (Generic) 3 x Per Week/30 Days Discharge Instructions: Use supplies as instructed; Kit contains: (15) Saline Bullets; (15) 3x3 Gauze; 15 pr Gloves Cleanser: Normal Saline (Generic) 3 x Per Week/30 Days Discharge Instructions: Wash your hands with soap and water. Remove old dressing, discard into plastic bag and place into trash. Cleanse the wound with Normal Saline prior to applying a clean dressing using gauze sponges, not tissues or cotton balls. Do not scrub or use excessive force. Pat dry using gauze sponges, not tissue or cotton balls. Primary Dressing: Hydrofera Blue Ready Transfer Foam, 2.5x2.5 (in/in) (Generic) 3 x Per Week/30 Days Discharge Instructions: Apply Hydrofera Blue Ready to wound bed as directed Secondary Dressing: ABD Pad 5x9 (in/in) (Generic) 3 x Per Week/30 Days Discharge Instructions: Cover with ABD pad Secondary Dressing: Conforming Guaze Roll-Large (Generic) 3 x Per Week/30 Days Discharge Instructions: Apply Conforming Stretch Guaze Bandage as directed Secured With: Tubigrip Size E, 3.5x10 (in/yds) (DME) (Generic) 3 x Per Week/30 Days Discharge Instructions: Apply 3 Tubigrip E 3-finger-widths below knee to base of toes to secure  dressing and/or for swelling. 1. Would recommend currently that we going continue with the wound care measures as before and patient is in agreement with plan this includes the use of the [facility] which I think has been beneficial. 2. We will also continue with the Tubigrip. 3. I would also recommend the patient continue as much as possible elevate his leg to keep edema under good control. We will see patient back for reevaluation in 1 week here in the clinic. If anything worsens or changes patient will contact our office for additional recommendations. Electronic Signature(s) Signed: 09/20/2020 2:18:45 PM By: Worthy Keeler PA-C Entered By: Worthy Keeler on 09/20/2020 14:18:45 [name] ([id number]) -------------------------------------------------------------------------------- SuperBill Details Patient Name: [name] Date of Service: 09/20/2020 Medical Record Number: [id number] Patient Account Number: [id number] Date of Birth/Sex: Mar 03, 1946 (74 y.o. M) Treating RN: Carlene Coria Primary Care Provider: Otilio Miu Other Clinician: Referring Provider: Otilio Miu Treating Provider/Extender: Jeri Cos Weeks in Treatment: 3 Diagnosis Coding ICD-10 Codes Code Description I87.2 Venous insufficiency (chronic) (peripheral) L97.822 Non-pressure chronic ulcer of other part of left lower leg with fat layer exposed [place] (primary) hypertension R73.9 Hyperglycemia, unspecified Facility Procedures CPT4 Code: [id number] Description: 37445 - WOUND CARE VISIT-LEV 3 EST PT Modifier: Quantity: 1 Physician Procedures CPT4 Code: [id number] Description: 98721 - WC PHYS LEVEL 3 - EST PT Modifier: Quantity: 1 CPT4 Code: Description: ICD-10 Diagnosis Description I87.2 Venous insufficiency (chronic) (peripheral) L97.822 Non-pressure chronic ulcer of other part of left lower leg with fat lay I10 Essential (primary) hypertension R73.9 Hyperglycemia, unspecified Modifier: er  exposed Quantity: Electronic Signature(s) Signed: 09/20/2020 2:18:55 PM By: Worthy Keeler PA-C Entered By: Worthy Keeler on 09/20/2020 14:18:55

## 2020-09-20 NOTE — Progress Notes (Signed)
[name] ([id number]) Visit Report for 09/20/2020 Arrival Information Details Patient Name: [name] Date of Service: 09/20/2020 10:00 AM Medical Record Number: [id number] Patient Account Number: [id number] Date of Birth/Sex: 21-Jul-1946 (75 y.o. M) Treating RN: Dolan Amen Primary Care Christinea Brizuela: Otilio Miu Other Clinician: Referring Lunabella Badgett: Otilio Miu Treating Aylah Yeary/Extender: Skipper Cliche in Treatment: 3 Visit Information History Since Last Visit Has Dressing in Place as Prescribed: Yes Patient Arrived: Cane Pain Present Now: No Arrival Time: 10:05 Accompanied By: self Transfer Assistance: None Patient Identification Verified: Yes Secondary Verification Process Completed: Yes Electronic Signature(s) Signed: 09/20/2020 4:00:09 PM By: Georges Mouse, Minus Breeding RN Entered By: Georges Mouse, Minus Breeding on 09/20/2020 10:05:52 [name] ([id number]) -------------------------------------------------------------------------------- Clinic Level of Care Assessment Details Patient Name: [name] Date of Service: 09/20/2020 10:00 AM Medical Record Number: [id number] Patient Account Number: [id number] Date of Birth/Sex: 1946-07-05 (74 y.o. M) Treating RN: Carlene Coria Primary Care Janyra Barillas: Otilio Miu Other Clinician: Referring Laurette Villescas: Otilio Miu Treating Kameran Lallier/Extender: Skipper Cliche in Treatment: 3 Clinic Level of Care Assessment Items TOOL 4 Quantity Score X - Use when only an EandM is performed on FOLLOW-UP visit 1 0 ASSESSMENTS - Nursing Assessment / Reassessment X - Reassessment of Co-morbidities (includes updates in patient status) 1 10 X- 1 5 Reassessment of Adherence to Treatment Plan ASSESSMENTS - Wound and Skin Assessment / Reassessment X - Simple Wound Assessment / Reassessment - one wound 1 5 '[]'  - 0 Complex Wound Assessment / Reassessment - multiple wounds '[]'  - 0 Dermatologic / Skin Assessment (not related to wound  area) ASSESSMENTS - Focused Assessment '[]'  - Circumferential Edema Measurements - multi extremities 0 '[]'  - 0 Nutritional Assessment / Counseling / Intervention '[]'  - 0 Lower Extremity Assessment (monofilament, tuning fork, pulses) '[]'  - 0 Peripheral Arterial Disease Assessment (using hand held doppler) ASSESSMENTS - Ostomy and/or Continence Assessment and Care '[]'  - Incontinence Assessment and Management 0 '[]'  - 0 Ostomy Care Assessment and Management (repouching, etc.) PROCESS - Coordination of Care X - Simple Patient / Family Education for ongoing care 1 15 '[]'  - 0 Complex (extensive) Patient / Family Education for ongoing care X- 1 10 Staff obtains Programmer, systems, Records, Test Results / Process Orders '[]'  - 0 Staff telephones HHA, Nursing Homes / Clarify orders / etc '[]'  - 0 Routine Transfer to another Facility (non-emergent condition) '[]'  - 0 Routine Hospital Admission (non-emergent condition) '[]'  - 0 New Admissions / Biomedical engineer / Ordering NPWT, Apligraf, etc. '[]'  - 0 Emergency Hospital Admission (emergent condition) X- 1 10 Simple Discharge Coordination '[]'  - 0 Complex (extensive) Discharge Coordination PROCESS - Special Needs '[]'  - Pediatric / Minor Patient Management 0 '[]'  - 0 Isolation Patient Management '[]'  - 0 Hearing / Language / Visual special needs '[]'  - 0 Assessment of Community assistance (transportation, D/C planning, etc.) '[]'  - 0 Additional assistance / Altered mentation '[]'  - 0 Support Surface(s) Assessment (bed, cushion, seat, etc.) INTERVENTIONS - Wound Cleansing / Measurement Prieur, Bleu ([id number]) X- 1 5 Simple Wound Cleansing - one wound '[]'  - 0 Complex Wound Cleansing - multiple wounds X- 1 5 Wound Imaging (photographs - any number of wounds) '[]'  - 0 Wound Tracing (instead of photographs) X- 1 5 Simple Wound Measurement - one wound '[]'  - 0 Complex Wound Measurement - multiple wounds INTERVENTIONS - Wound Dressings X - Small Wound Dressing  one or multiple wounds 1 10 '[]'  - 0 Medium Wound Dressing one or multiple wounds '[]'  - 0 Large Wound Dressing one or multiple wounds X-  1 5 Application of Medications - topical '[]'  - 0 Application of Medications - injection INTERVENTIONS - Miscellaneous '[]'  - External ear exam 0 '[]'  - 0 Specimen Collection (cultures, biopsies, blood, body fluids, etc.) '[]'  - 0 Specimen(s) / Culture(s) sent or taken to Lab for analysis '[]'  - 0 Patient Transfer (multiple staff / Civil Service fast streamer / Similar devices) '[]'  - 0 Simple Staple / Suture removal (25 or less) '[]'  - 0 Complex Staple / Suture removal (26 or more) '[]'  - 0 Hypo / Hyperglycemic Management (close monitor of Blood Glucose) '[]'  - 0 Ankle / Brachial Index (ABI) - do not check if billed separately X- 1 5 Vital Signs Has the patient been seen at the hospital within the last three years: Yes Total Score: 90 Level Of Care: New/Established - Level 3 Electronic Signature(s) Signed: 09/20/2020 4:08:54 PM By: Carlene Coria RN Entered By: Carlene Coria on 09/20/2020 10:29:15 [name] ([id number]) -------------------------------------------------------------------------------- Lower Extremity Assessment Details Patient Name: [name] Date of Service: 09/20/2020 10:00 AM Medical Record Number: [id number] Patient Account Number: [id number] Date of Birth/Sex: 1946/04/27 (74 y.o. M) Treating RN: Dolan Amen Primary Care Jerri Glauser: Otilio Miu Other Clinician: Referring Anissia Wessells: Otilio Miu Treating Marguita Venning/Extender: Jeri Cos Weeks in Treatment: 3 Edema Assessment Assessed: [Left: Yes] [Right: No] Edema: [Left: N] [Right: o] Calf Left: Right: Point of Measurement: 33 cm From Medial Instep 37.2 cm Ankle Left: Right: Point of Measurement: 10 cm From Medial Instep 24.5 cm Vascular Assessment Pulses: Dorsalis Pedis Palpable: [Left:Yes] Electronic Signature(s) Signed: 09/20/2020 4:00:09 PM By: Georges Mouse, Minus Breeding RN Entered By:  Georges Mouse, Minus Breeding on 09/20/2020 10:14:04 [name] ([id number]) -------------------------------------------------------------------------------- Multi Wound Chart Details Patient Name: [name] Date of Service: 09/20/2020 10:00 AM Medical Record Number: [id number] Patient Account Number: [id number] Date of Birth/Sex: 1946-02-19 (75 y.o. M) Treating RN: Carlene Coria Primary Care Rainelle Sulewski: Otilio Miu Other Clinician: Referring Javana Schey: Otilio Miu Treating Angelus Hoopes/Extender: Skipper Cliche in Treatment: 3 Vital Signs Height(in): 71 Pulse(bpm): 66 Weight(lbs): 205 Blood Pressure(mmHg): 151/76 Body Mass Index(BMI): 29 Temperature(F): 97.8 Respiratory Rate(breaths/min): 16 Photos: [N/A:N/A] Wound Location: Left, Lateral Lower Leg N/A N/A Wounding Event: Skin Tear/Laceration N/A N/A Primary Etiology: Trauma, Other N/A N/A Comorbid History: Human Immunodeficiency Virus, N/A N/A Hypertension Date Acquired: 07/30/2020 N/A N/A Weeks of Treatment: 3 N/A N/A Wound Status: Open N/A N/A Measurements L x W x D (cm) 2.2x3.8x0.1 N/A N/A Area (cm) : 6.566 N/A N/A Volume (cm) : 0.657 N/A N/A % Reduction in Area: 18.40% N/A N/A % Reduction in Volume: 18.40% N/A N/A Classification: Full Thickness Without Exposed N/A N/A Support Structures Exudate Amount: Medium N/A N/A Exudate Type: Serosanguineous N/A N/A Exudate Color: red, brown N/A N/A Wound Margin: Flat and Intact N/A N/A Granulation Amount: Large (67-100%) N/A N/A Granulation Quality: Red N/A N/A Necrotic Amount: None Present (0%) N/A N/A Exposed Structures: Fat Layer (Subcutaneous Tissue): N/A N/A Yes Fascia: No Tendon: No Muscle: No Joint: No Bone: No Epithelialization: None N/A N/A Treatment Notes Electronic Signature(s) Signed: 09/20/2020 4:08:54 PM By: Carlene Coria RN Entered By: Carlene Coria on 09/20/2020 10:28:01 [name]  ([id number]) -------------------------------------------------------------------------------- Multi-Disciplinary Care Plan Details Patient Name: [name] Date of Service: 09/20/2020 10:00 AM Medical Record Number: [id number] Patient Account Number: [id number] Date of Birth/Sex: 11-03-1945 (75 y.o. M) Treating RN: Carlene Coria Primary Care Tinzlee Craker: Otilio Miu Other Clinician: Referring Olivianna Higley: Otilio Miu Treating Alford Gamero/Extender: Skipper Cliche in Treatment: 3 Active Inactive Venous Leg Ulcer Nursing Diagnoses: Actual venous Insuffiency (use after diagnosis is confirmed) Goals:  Patient will maintain optimal edema control Date Initiated: 08/30/2020 Target Resolution Date: 09/27/2020 Goal Status: Active Verify adequate tissue perfusion prior to therapeutic compression application Date Initiated: 08/30/2020 Date Inactivated: 09/06/2020 Target Resolution Date: 08/30/2020 Goal Status: Met Interventions: Assess peripheral edema status every visit. Compression as ordered Provide education on venous insufficiency Notes: Wound/Skin Impairment Nursing Diagnoses: Impaired tissue integrity Goals: Ulcer/skin breakdown will have a volume reduction of 30% by week 4 Date Initiated: 08/30/2020 Target Resolution Date: 09/27/2020 Goal Status: Active Ulcer/skin breakdown will have a volume reduction of 50% by week 8 Date Initiated: 08/30/2020 Target Resolution Date: 10/11/2020 Goal Status: Active Interventions: Assess patient/caregiver ability to obtain necessary supplies Assess patient/caregiver ability to perform ulcer/skin care regimen upon admission and as needed Assess ulceration(s) every visit Treatment Activities: Skin care regimen initiated : 08/30/2020 Topical wound management initiated : 08/30/2020 Notes: Electronic Signature(s) Signed: 09/20/2020 4:08:54 PM By: Carlene Coria RN Entered By: Carlene Coria on 09/20/2020 10:27:53 [name]  ([id number]) -------------------------------------------------------------------------------- Pain Assessment Details Patient Name: [name] Date of Service: 09/20/2020 10:00 AM Medical Record Number: [id number] Patient Account Number: [id number] Date of Birth/Sex: 02/09/1946 (75 y.o. M) Treating RN: Dolan Amen Primary Care Kaison Mcparland: Otilio Miu Other Clinician: Referring Chirsty Armistead: Otilio Miu Treating Lauranne Beyersdorf/Extender: Jeri Cos Weeks in Treatment: 3 Active Problems Location of Pain Severity and Description of Pain Patient Has Paino No Site Locations Rate the pain. Current Pain Level: 0 Pain Management and Medication Current Pain Management: Electronic Signature(s) Signed: 09/20/2020 4:00:09 PM By: Georges Mouse, Minus Breeding RN Entered By: Georges Mouse, Minus Breeding on 09/20/2020 10:07:32 [name] ([id number]) -------------------------------------------------------------------------------- Patient/Caregiver Education Details Patient Name: [name] Date of Service: 09/20/2020 10:00 AM Medical Record Number: [id number] Patient Account Number: [id number] Date of Birth/Gender: January 07, 1946 (75 y.o. M) Treating RN: Carlene Coria Primary Care Physician: Otilio Miu Other Clinician: Referring Physician: Otilio Miu Treating Physician/Extender: Skipper Cliche in Treatment: 3 Education Assessment Education Provided To: Patient Education Topics Provided Venous: Methods: Explain/Verbal Responses: State content correctly Electronic Signature(s) Signed: 09/20/2020 4:08:54 PM By: Carlene Coria RN Entered By: Carlene Coria on 09/20/2020 10:29:58 [name] ([id number]) -------------------------------------------------------------------------------- Wound Assessment Details Patient Name: [name] Date of Service: 09/20/2020 10:00 AM Medical Record Number: [id number] Patient Account Number: [id number] Date of Birth/Sex: 10-Jul-1946 (75 y.o. M) Treating RN:  Dolan Amen Primary Care Jahnae Mcadoo: Otilio Miu Other Clinician: Referring Jayleana Colberg: Otilio Miu Treating Rhonda Linan/Extender: Jeri Cos Weeks in Treatment: 3 Wound Status Wound Number: 1 Primary Etiology: Trauma, Other Wound Location: Left, Lateral Lower Leg Wound Status: Open Wounding Event: Skin Tear/Laceration Comorbid History: Human Immunodeficiency Virus, Hypertension Date Acquired: 07/30/2020 Weeks Of Treatment: 3 Clustered Wound: No Photos Wound Measurements Length: (cm) 2.2 Width: (cm) 3.8 Depth: (cm) 0.1 Area: (cm) 6.566 Volume: (cm) 0.657 % Reduction in Area: 18.4% % Reduction in Volume: 18.4% Epithelialization: None Tunneling: No Undermining: No Wound Description Classification: Full Thickness Without Exposed Support Structu Wound Margin: Flat and Intact Exudate Amount: Medium Exudate Type: Serosanguineous Exudate Color: red, brown res Foul Odor After Cleansing: No Slough/Fibrino Yes Wound Bed Granulation Amount: Large (67-100%) Exposed Structure Granulation Quality: Red Fascia Exposed: No Necrotic Amount: None Present (0%) Fat Layer (Subcutaneous Tissue) Exposed: Yes Tendon Exposed: No Muscle Exposed: No Joint Exposed: No Bone Exposed: No Electronic Signature(s) Signed: 09/20/2020 4:00:09 PM By: Georges Mouse, Minus Breeding RN Entered By: Georges Mouse, Minus Breeding on 09/20/2020 10:12:43 [name] ([id number]) -------------------------------------------------------------------------------- Vitals Details Patient Name: [name] Date of Service: 09/20/2020 10:00 AM Medical Record Number: [id number] Patient Account Number: [id number] Date of Birth/Sex: 04-Nov-1945 (  75 y.o. M) Treating RN: Dolan Amen Primary Care Brailey Buescher: Otilio Miu Other Clinician: Referring Miosha Behe: Otilio Miu Treating Siraj Dermody/Extender: Jeri Cos Weeks in Treatment: 3 Vital Signs Time Taken: 10:05 Temperature (F): 97.8 Height (in): 71 Pulse (bpm):  66 Weight (lbs): 205 Respiratory Rate (breaths/min): 16 Body Mass Index (BMI): 28.6 Blood Pressure (mmHg): 151/76 Reference Range: 80 - 120 mg / dl Electronic Signature(s) Signed: 09/20/2020 4:00:09 PM By: Georges Mouse, Minus Breeding RN Entered By: Georges Mouse, Minus Breeding on 09/20/2020 10:07:26

## 2020-09-27 ENCOUNTER — Other Ambulatory Visit: Payer: Self-pay

## 2020-09-27 ENCOUNTER — Encounter: Payer: Medicare Other | Admitting: Physician Assistant

## 2020-09-27 DIAGNOSIS — R739 Hyperglycemia, unspecified: Secondary | ICD-10-CM | POA: Diagnosis not present

## 2020-09-27 DIAGNOSIS — I872 Venous insufficiency (chronic) (peripheral): Secondary | ICD-10-CM | POA: Diagnosis not present

## 2020-09-27 DIAGNOSIS — S81812A Laceration without foreign body, left lower leg, initial encounter: Secondary | ICD-10-CM | POA: Diagnosis not present

## 2020-09-27 DIAGNOSIS — L97822 Non-pressure chronic ulcer of other part of left lower leg with fat layer exposed: Secondary | ICD-10-CM | POA: Diagnosis not present

## 2020-09-27 DIAGNOSIS — I10 Essential (primary) hypertension: Secondary | ICD-10-CM | POA: Diagnosis not present

## 2020-09-27 NOTE — Progress Notes (Addendum)
[name] ([id number]) Visit Report for 09/27/2020 Arrival Information Details Patient Name: [name] Date of Service: 09/27/2020 10:00 AM Medical Record Number: [id number] Patient Account Number: [id number] Date of Birth/Sex: 1945/09/11 (75 y.o. M) Treating RN: Carlene Coria Primary Care : Otilio Miu Other Clinician: Referring : Otilio Miu Treating /Extender: Skipper Cliche in Treatment: 4 Visit Information History Since Last Visit All ordered tests and consults were completed: No Patient Arrived: [name] Added or deleted any medications: No Arrival Time: 10:27 Any new allergies or adverse reactions: No Accompanied By: self Had a fall or experienced change in No Transfer Assistance: None activities of daily living that may affect Patient Identification Verified: Yes risk of falls: Secondary Verification Process Completed: Yes Signs or symptoms of abuse/neglect since last visito No Patient Requires Transmission-Based Precautions: No Hospitalized since last visit: No Patient Has Alerts: No Implantable device outside of the clinic excluding No cellular tissue based products placed in the center since last visit: Has Dressing in Place as Prescribed: Yes Has Compression in Place as Prescribed: Yes Pain Present Now: No Electronic Signature(s) Signed: 09/27/2020 4:57:18 PM By: Carlene Coria RN Entered By: Carlene Coria on 09/27/2020 10:36:26 [name] ([id number]) -------------------------------------------------------------------------------- Lower Extremity Assessment Details Patient Name: [name] Date of Service: 09/27/2020 10:00 AM Medical Record Number: [id number] Patient Account Number: [id number] Date of Birth/Sex: 1945-10-31 (74 y.o. M) Treating RN: Carlene Coria Primary Care : Otilio Miu Other Clinician: Referring : Otilio Miu Treating /Extender: Jeri Cos Weeks in Treatment: 4 Edema  Assessment Assessed: [Left: No] [Right: No] Edema: [Left: Ye] [Right: s] Calf Left: Right: Point of Measurement: 33 cm From Medial Instep 37 cm Ankle Left: Right: Point of Measurement: 10 cm From Medial Instep 23.8 cm Vascular Assessment Pulses: Dorsalis Pedis Palpable: [Left:Yes] Electronic Signature(s) Signed: 09/27/2020 4:57:18 PM By: Carlene Coria RN Entered By: Carlene Coria on 09/27/2020 10:43:53 [name] ([id number]) -------------------------------------------------------------------------------- Multi Wound Chart Details Patient Name: [name] Date of Service: 09/27/2020 10:00 AM Medical Record Number: [id number] Patient Account Number: [id number] Date of Birth/Sex: [birth year] (74 y.o. M) Treating RN: Carlene Coria Primary Care : Otilio Miu Other Clinician: Referring : Otilio Miu Treating /Extender: Jeri Cos Weeks in Treatment: 4 Vital Signs Height(in): 71 Pulse(bpm): 113 Weight(lbs): 205 Blood Pressure(mmHg): 162/80 Body Mass Index(BMI): 29 Temperature(F): 97.5 Respiratory Rate(breaths/min): 20 Photos: [N/A:N/A] Wound Location: Left, Lateral Lower Leg N/A N/A Wounding Event: Skin Tear/Laceration N/A N/A Primary Etiology: Trauma, Other N/A N/A Comorbid History: Human Immunodeficiency Virus, N/A N/A Hypertension Date Acquired: 07/30/2020 N/A N/A Weeks of Treatment: 4 N/A N/A Wound Status: Open N/A N/A Measurements L x W x D (cm) 2.1x3x0.1 N/A N/A Area (cm) : 4.948 N/A N/A Volume (cm) : 0.495 N/A N/A % Reduction in Area: 38.50% N/A N/A % Reduction in Volume: 38.50% N/A N/A Classification: Full Thickness Without Exposed N/A N/A Support Structures Exudate Amount: Medium N/A N/A Exudate Type: Serosanguineous N/A N/A Exudate Color: red, brown N/A N/A Wound Margin: Flat and Intact N/A N/A Granulation Amount: Large (67-100%) N/A N/A Granulation Quality: Red N/A N/A Necrotic Amount: None Present (0%) N/A N/A Exposed  Structures: Fat Layer (Subcutaneous Tissue): N/A N/A Yes Fascia: No Tendon: No Muscle: No Joint: No Bone: No Epithelialization: None N/A N/A Treatment Notes Electronic Signature(s) Signed: 09/27/2020 4:57:18 PM By: Carlene Coria RN Entered By: Carlene Coria on 09/27/2020 10:47:54 [name] ([id number]) -------------------------------------------------------------------------------- Multi-Disciplinary Care Plan Details Patient Name: [name] Date of Service: 09/27/2020 10:00 AM Medical Record Number: [id number] Patient Account Number: [id number] Date of Birth/Sex:  [birth year] (75 y.o. M) Treating RN: Carlene Coria Primary Care : Otilio Miu Other Clinician: Referring : Otilio Miu Treating /Extender: Skipper Cliche in Treatment: 4 Active Inactive Venous Leg Ulcer Nursing Diagnoses: Actual venous Insuffiency (use after diagnosis is confirmed) Goals: Patient will maintain optimal edema control Date Initiated: 08/30/2020 Target Resolution Date: 10/25/2020 Goal Status: Active Verify adequate tissue perfusion prior to therapeutic compression application Date Initiated: 08/30/2020 Date Inactivated: 09/06/2020 Target Resolution Date: 08/30/2020 Goal Status: Met Interventions: Assess peripheral edema status every visit. Compression as ordered Provide education on venous insufficiency Notes: Wound/Skin Impairment Nursing Diagnoses: Impaired tissue integrity Goals: Ulcer/skin breakdown will have a volume reduction of 30% by week 4 Date Initiated: 08/30/2020 Date Inactivated: 09/27/2020 Target Resolution Date: 09/27/2020 Goal Status: Met Ulcer/skin breakdown will have a volume reduction of 50% by week 8 Date Initiated: 08/30/2020 Target Resolution Date: 10/11/2020 Goal Status: Active Interventions: Assess patient/caregiver ability to obtain necessary supplies Assess patient/caregiver ability to perform ulcer/skin care regimen upon admission and  as needed Assess ulceration(s) every visit Treatment Activities: Skin care regimen initiated : 08/30/2020 Topical wound management initiated : 08/30/2020 Notes: Electronic Signature(s) Signed: 09/27/2020 4:57:18 PM By: Carlene Coria RN Entered By: Carlene Coria on 09/27/2020 10:47:28 [name] ([id number]) -------------------------------------------------------------------------------- Pain Assessment Details Patient Name: [name] Date of Service: 09/27/2020 10:00 AM Medical Record Number: [id number] Patient Account Number: [id number] Date of Birth/Sex: 08-Jan-1946 (75 y.o. M) Treating RN: Carlene Coria Primary Care : Otilio Miu Other Clinician: Referring : Otilio Miu Treating /Extender: Jeri Cos Weeks in Treatment: 4 Active Problems Location of Pain Severity and Description of Pain Patient Has Paino No Site Locations Pain Management and Medication Current Pain Management: Electronic Signature(s) Signed: 09/27/2020 4:57:18 PM By: Carlene Coria RN Entered By: Carlene Coria on 09/27/2020 10:36:56 [name] ([id number]) -------------------------------------------------------------------------------- Wound Assessment Details Patient Name: [name] Date of Service: 09/27/2020 10:00 AM Medical Record Number: [id number] Patient Account Number: [id number] Date of Birth/Sex: Oct 24, 1945 (74 y.o. M) Treating RN: Carlene Coria Primary Care : Otilio Miu Other Clinician: Referring : Otilio Miu Treating /Extender: Jeri Cos Weeks in Treatment: 4 Wound Status Wound Number: 1 Primary Etiology: Trauma, Other Wound Location: Left, Lateral Lower Leg Wound Status: Open Wounding Event: Skin Tear/Laceration Comorbid History: Human Immunodeficiency Virus, Hypertension Date Acquired: 07/30/2020 Weeks Of Treatment: 4 Clustered Wound: No Photos Wound Measurements Length: (cm) 2.1 Width: (cm) 3 Depth: (cm) 0.1 Area:  (cm) 4.948 Volume: (cm) 0.495 % Reduction in Area: 38.5% % Reduction in Volume: 38.5% Epithelialization: None Tunneling: No Undermining: No Wound Description Classification: Full Thickness Without Exposed Support Structu Wound Margin: Flat and Intact Exudate Amount: Medium Exudate Type: Serosanguineous Exudate Color: red, brown res Foul Odor After Cleansing: No Slough/Fibrino Yes Wound Bed Granulation Amount: Large (67-100%) Exposed Structure Granulation Quality: Red Fascia Exposed: No Necrotic Amount: None Present (0%) Fat Layer (Subcutaneous Tissue) Exposed: Yes Tendon Exposed: No Muscle Exposed: No Joint Exposed: No Bone Exposed: No Electronic Signature(s) Signed: 09/27/2020 4:57:18 PM By: Carlene Coria RN Entered By: Carlene Coria on 09/27/2020 10:43:24 [name] ([id number]) -------------------------------------------------------------------------------- Vitals Details Patient Name: [name] Date of Service: 09/27/2020 10:00 AM Medical Record Number: [id number] Patient Account Number: [id number] Date of Birth/Sex: 10/17/1945 (75 y.o. M) Treating RN: Carlene Coria Primary Care : Otilio Miu Other Clinician: Referring : Otilio Miu Treating /Extender: Skipper Cliche in Treatment: 4 Vital Signs Time Taken: 10:36 Temperature (F): 97.5 Height (in): 71 Pulse (bpm): 113 Weight (lbs): 205 Respiratory Rate (breaths/min): 20 Body Mass Index (BMI): 28.6 Blood  Pressure (mmHg): 162/80 Reference Range: 80 - 120 mg / dl Electronic Signature(s) Signed: 09/27/2020 4:57:18 PM By: Carlene Coria RN Entered By: Carlene Coria on 09/27/2020 10:36:44

## 2020-09-27 NOTE — Progress Notes (Addendum)
[name], Jayd ([id number]) Visit Report for 09/27/2020 Chief Complaint Document Details Patient Name: [name], [name] Date of Service: 09/27/2020 10:00 AM Medical Record Number: [id number] Patient Account Number: [id number] Date of Birth/Sex: 1945-09-17 (75 y.o. M) Treating RN: Yevonne PaxEpps, Carrie Primary Care Provider: Elizabeth SauerJones, Deanna Other Clinician: Referring Provider: Elizabeth SauerJones, Deanna Treating Provider/Extender: Rowan BlaseStone, Delenn Ahn Weeks in Treatment: 4 Information Obtained from: Patient Chief Complaint Left LE Ulcer Electronic Signature(s) Signed: 09/27/2020 10:03:53 AM By: Lenda KelpStone III, Amogh Komatsu PA-C Entered By: Lenda KelpStone III, Eh Sesay on 09/27/2020 10:03:53 [name], [name] ([id number]) -------------------------------------------------------------------------------- Debridement Details Patient Name: [name], [name] Date of Service: 09/27/2020 10:00 AM Medical Record Number: [id number] Patient Account Number: [id number] Date of Birth/Sex: 1945-09-17 (74 y.o. M) Treating RN: Yevonne PaxEpps, Carrie Primary Care Provider: Elizabeth SauerJones, Deanna Other Clinician: Referring Provider: Elizabeth SauerJones, Deanna Treating Provider/Extender: Allen DerryStone, Jaylenn Altier Weeks in Treatment: 4 Debridement Performed for Wound #1 Left,Lateral Lower Leg Assessment: Performed By: Physician Nelida MeuseStone, Johnell Bas E., PA-C Debridement Type: Debridement Level of Consciousness (Pre- Awake and Alert procedure): Pre-procedure Verification/Time Out Yes - 10:45 Taken: Start Time: 10:45 Pain Control: Lidocaine 4% Topical Solution Total Area Debrided (L x W): 2.1 (cm) x 3 (cm) = 6.3 (cm) Tissue and other material Viable, Non-Viable, Skin: Dermis , Skin: Epidermis debrided: Level: Skin/Epidermis Debridement Description: Selective/Open Wound Instrument: Curette Bleeding: Minimum Hemostasis Achieved: Pressure End Time: 10:49 Procedural Pain: 0 Post Procedural Pain: 0 Response to Treatment: Procedure was tolerated well Level of Consciousness (Post- Awake and Alert procedure): Post  Debridement Measurements of Total Wound Length: (cm) 2.1 Width: (cm) 3 Depth: (cm) 0.1 Volume: (cm) 0.495 Character of Wound/Ulcer Post Debridement: Improved Post Procedure Diagnosis Same as Pre-procedure Electronic Signature(s) Signed: 09/27/2020 4:57:18 PM By: Yevonne PaxEpps, Carrie RN Signed: 09/27/2020 5:46:59 PM By: Lenda KelpStone III, Benjerman Molinelli PA-C Entered By: Yevonne PaxEpps, Carrie on 09/27/2020 10:50:02 [name], [name] ([id number]) -------------------------------------------------------------------------------- HPI Details Patient Name: [name], [name] Date of Service: 09/27/2020 10:00 AM Medical Record Number: [id number] Patient Account Number: [id number] Date of Birth/Sex: 1945-09-17 (75 y.o. M) Treating RN: Yevonne PaxEpps, Carrie Primary Care Provider: Elizabeth SauerJones, Deanna Other Clinician: Referring Provider: Elizabeth SauerJones, Deanna Treating Provider/Extender: Rowan BlaseStone, Tene Gato Weeks in Treatment: 4 History of Present Illness HPI Description: 08/30/2020 upon evaluation today patient presents today for inspection regarding a wound on his left lower extremity. He does have evidence of some chronic venous insufficiency and hemosiderin staining. With that being said he tells me this began after his stroke though he does not appear to be too swollen today this is good news. Fortunately there is no evidence of active infection at this time. No fevers, chills, nausea, vomiting, or diarrhea. He does have a history of hypertension and occasional elevated blood sugar levels although he does not have a formal diagnosis of diabetes according to what he tells me today. 09/06/2020 upon evaluation today patient appears to be doing well with regard to his leg ulcer. I do feel like the Tubigrip has helped and I do feel like as well but the patient seems to be making good progress all things considered. In the past week this has measured better and seems to be healing quite well that is excellent news. Overall I am extremely pleased at this point. 09/13/2020 upon  evaluation today patient appears to be doing well with regard to his wound. He has been tolerating the dressing changes with Hydrofera Blue without complication the wound bed appears to be doing significantly better which is great news. Overall I'm extremely pleased with where things stand. I do believe he still has a little bit of time to get  this to heal just due to the overall size of the wound but nonetheless I think we are headed in the appropriate direction making good progress based on what I'm seeing. 09/20/2020 upon evaluation today patient appears to be doing well with regard to his leg ulcer. He has been tolerating the dressing changes without complication. Fortunately there is no signs of active infection at this time. No fevers, chills, nausea, vomiting, or diarrhea. 09/27/2020 upon evaluation today patient appears to be doing well with regard to his wound. He has been tolerating the dressing changes without complication. Fortunately there is no signs of active infection which is great news. No fevers, chills, nausea, vomiting, or diarrhea. Electronic Signature(s) Signed: 09/27/2020 10:54:10 AM By: Lenda Kelp PA-C Entered By: Lenda Kelp on 09/27/2020 10:54:10 [name] ([id number]) -------------------------------------------------------------------------------- Physical Exam Details Patient Name: [name] Date of Service: 09/27/2020 10:00 AM Medical Record Number: [id number] Patient Account Number: [id number] Date of Birth/Sex: 1946-03-02 (74 y.o. M) Treating RN: Yevonne Pax Primary Care Provider: Elizabeth Sauer Other Clinician: Referring Provider: Elizabeth Sauer Treating Provider/Extender: Allen Derry Weeks in Treatment: 4 Constitutional Well-nourished and well-hydrated in no acute distress. Respiratory normal breathing without difficulty. Psychiatric this patient is able to make decisions and demonstrates good insight into disease process. Alert and Oriented  x 3. pleasant and cooperative. Notes Patient's wound bed currently showed signs of good granulation epithelization. There does not appear to be any signs of active infection which is great news and overall very pleased with where things stand. Electronic Signature(s) Signed: 09/27/2020 10:54:25 AM By: Lenda Kelp PA-C Entered By: Lenda Kelp on 09/27/2020 10:54:25 [name] ([id number]) -------------------------------------------------------------------------------- Problem List Details Patient Name: [name] Date of Service: 09/27/2020 10:00 AM Medical Record Number: [id number] Patient Account Number: [id number] Date of Birth/Sex: 11/17/1945 (74 y.o. M) Treating RN: Yevonne Pax Primary Care Provider: Elizabeth Sauer Other Clinician: Referring Provider: Elizabeth Sauer Treating Provider/Extender: Rowan Blase in Treatment: 4 Active Problems ICD-10 Encounter Code Description Active Date MDM Diagnosis I87.2 Venous insufficiency (chronic) (peripheral) 08/30/2020 No Yes L97.822 Non-pressure chronic ulcer of other part of left lower leg with fat layer 08/30/2020 No Yes exposed I10 Essential (primary) hypertension 08/30/2020 No Yes R73.9 Hyperglycemia, unspecified 08/30/2020 No Yes Inactive Problems Resolved Problems Electronic Signature(s) Signed: 09/27/2020 10:03:47 AM By: Lenda Kelp PA-C Entered By: Lenda Kelp on 09/27/2020 10:03:47 [name] ([id number]) -------------------------------------------------------------------------------- Progress Note Details Patient Name: [name] Date of Service: 09/27/2020 10:00 AM Medical Record Number: [id number] Patient Account Number: [id number] Date of Birth/Sex: [birth year] (74 y.o. M) Treating RN: Yevonne Pax Primary Care Provider: Elizabeth Sauer Other Clinician: Referring Provider: Elizabeth Sauer Treating Provider/Extender: Rowan Blase in Treatment: 4 Subjective Chief Complaint Information  obtained from Patient Left LE Ulcer History of Present Illness (HPI) 08/30/2020 upon evaluation today patient presents today for inspection regarding a wound on his left lower extremity. He does have evidence of some chronic venous insufficiency and hemosiderin staining. With that being said he tells me this began after his stroke though he does not appear to be too swollen today this is good news. Fortunately there is no evidence of active infection at this time. No fevers, chills, nausea, vomiting, or diarrhea. He does have a history of hypertension and occasional elevated blood sugar levels although he does not have a formal diagnosis of diabetes according to what he tells me today. 09/06/2020 upon evaluation today patient appears to be doing well with regard to his leg ulcer. I do  feel like the Tubigrip has helped and I do feel like as well but the patient seems to be making good progress all things considered. In the past week this has measured better and seems to be healing quite well that is excellent news. Overall I am extremely pleased at this point. 09/13/2020 upon evaluation today patient appears to be doing well with regard to his wound. He has been tolerating the dressing changes with Hydrofera Blue without complication the wound bed appears to be doing significantly better which is great news. Overall I'm extremely pleased with where things stand. I do believe he still has a little bit of time to get this to heal just due to the overall size of the wound but nonetheless I think we are headed in the appropriate direction making good progress based on what I'm seeing. 09/20/2020 upon evaluation today patient appears to be doing well with regard to his leg ulcer. He has been tolerating the dressing changes without complication. Fortunately there is no signs of active infection at this time. No fevers, chills, nausea, vomiting, or diarrhea. 09/27/2020 upon evaluation today patient appears to be  doing well with regard to his wound. He has been tolerating the dressing changes without complication. Fortunately there is no signs of active infection which is great news. No fevers, chills, nausea, vomiting, or diarrhea. Objective Constitutional Well-nourished and well-hydrated in no acute distress. Vitals Time Taken: 10:36 AM, Height: 71 in, Weight: 205 lbs, BMI: 28.6, Temperature: 97.5 F, Pulse: 113 bpm, Respiratory Rate: 20 breaths/min, Blood Pressure: 162/80 mmHg. Respiratory normal breathing without difficulty. Psychiatric this patient is able to make decisions and demonstrates good insight into disease process. Alert and Oriented x 3. pleasant and cooperative. General Notes: Patient's wound bed currently showed signs of good granulation epithelization. There does not appear to be any signs of active infection which is great news and overall very pleased with where things stand. Integumentary (Hair, Skin) Wound #1 status is Open. Original cause of wound was Skin Tear/Laceration. The date acquired was: 07/30/2020. The wound has been in treatment 4 weeks. The wound is located on the Left,Lateral Lower Leg. The wound measures 2.1cm length x 3cm width x 0.1cm depth; 4.948cm^2 area and 0.495cm^3 volume. There is Fat Layer (Subcutaneous Tissue) exposed. There is no tunneling or undermining noted. There is a medium amount of serosanguineous drainage noted. The wound margin is flat and intact. There is large (67-100%) red granulation within the wound bed. There is no necrotic tissue within the wound bed. MATYAS, BAISLEY ([id number]) Assessment Active Problems ICD-10 Venous insufficiency (chronic) (peripheral) Non-pressure chronic ulcer of other part of left lower leg with fat layer exposed Essential (primary) hypertension Hyperglycemia, unspecified Procedures Wound #1 Pre-procedure diagnosis of Wound #1 is a Trauma, Other located on the Left,Lateral Lower Leg . There was a  Selective/Open Wound Skin/Epidermis Debridement with a total area of 6.3 sq cm performed by Nelida Meuse., PA-C. With the following instrument(s): Curette to remove Viable and Non-Viable tissue/material. Material removed includes Skin: Dermis and Skin: Epidermis and after achieving pain control using Lidocaine 4% Topical Solution. No specimens were taken. A time out was conducted at 10:45, prior to the start of the procedure. A Minimum amount of bleeding was controlled with Pressure. The procedure was tolerated well with a pain level of 0 throughout and a pain level of 0 following the procedure. Post Debridement Measurements: 2.1cm length x 3cm width x 0.1cm depth; 0.495cm^3 volume. Character of Wound/Ulcer Post Debridement is improved.  Post procedure Diagnosis Wound #1: Same as Pre-Procedure Plan 1. I am going to recommend at this point that we have the patient go ahead and continue with the wound care measures as before and he is in agreement with the plan this includes the use of the [facility] along with Tubigrip to cover. This is for compression. 2. With regard to the Tubigrip he did get this shipment in from [facility] but unfortunately the box he got is not Tubigrip E even though he has it labeled as such. I think there is a manufacturing error here that is causing an issue. There is no way that what is seen in his box with fit over anybody stye. And again that is supposed to be sufficient for over a small thigh. Nonetheless I did discuss with the patient that we will go ahead and contact Josh with [employer] [employer] and I did do so he is going to send out the appropriate to the patient. We will see patient back for reevaluation in 1 week here in the clinic. If anything worsens or changes patient will contact our office for additional recommendations. Electronic Signature(s) Signed: 09/27/2020 10:55:16 AM By: Lenda Kelp PA-C Entered By: Lenda Kelp on 09/27/2020 10:55:16 [name]  ([id number]) -------------------------------------------------------------------------------- SuperBill Details Patient Name: [name] Date of Service: 09/27/2020 Medical Record Number: [id number] Patient Account Number: [id number] Date of Birth/Sex: 08-31-1945 (74 y.o. M) Treating RN: Yevonne Pax Primary Care Provider: Elizabeth Sauer Other Clinician: Referring Provider: Elizabeth Sauer Treating Provider/Extender: Allen Derry Weeks in Treatment: 4 Diagnosis Coding ICD-10 Codes Code Description I87.2 Venous insufficiency (chronic) (peripheral) L97.822 Non-pressure chronic ulcer of other part of left lower leg with fat layer exposed I10 Essential (primary) hypertension R73.9 Hyperglycemia, unspecified Facility Procedures CPT4 Code: [id number] Description: [phone] - DEBRIDE WOUND 1ST 20 SQ CM OR < Modifier: Quantity: 1 CPT4 Code: Description: ICD-10 Diagnosis Description L97.822 Non-pressure chronic ulcer of other part of left lower leg with fat layer Modifier: exposed Quantity: Physician Procedures CPT4 Code: [id number] Description: 97597 - WC PHYS DEBR WO ANESTH 20 SQ CM Modifier: Quantity: 1 CPT4 Code: Description: ICD-10 Diagnosis Description L97.822 Non-pressure chronic ulcer of other part of left lower leg with fat layer Modifier: exposed Quantity: Electronic Signature(s) Signed: 09/27/2020 10:55:28 AM By: Lenda Kelp PA-C Entered By: Lenda Kelp on 09/27/2020 10:55:28

## 2020-10-04 ENCOUNTER — Other Ambulatory Visit: Payer: Self-pay

## 2020-10-04 ENCOUNTER — Encounter: Payer: Medicare Other | Admitting: Physician Assistant

## 2020-10-04 DIAGNOSIS — I10 Essential (primary) hypertension: Secondary | ICD-10-CM | POA: Diagnosis not present

## 2020-10-04 DIAGNOSIS — R739 Hyperglycemia, unspecified: Secondary | ICD-10-CM | POA: Diagnosis not present

## 2020-10-04 DIAGNOSIS — S81812A Laceration without foreign body, left lower leg, initial encounter: Secondary | ICD-10-CM | POA: Diagnosis not present

## 2020-10-04 DIAGNOSIS — L97822 Non-pressure chronic ulcer of other part of left lower leg with fat layer exposed: Secondary | ICD-10-CM | POA: Diagnosis not present

## 2020-10-04 DIAGNOSIS — I872 Venous insufficiency (chronic) (peripheral): Secondary | ICD-10-CM | POA: Diagnosis not present

## 2020-10-04 NOTE — Progress Notes (Signed)
Mark, Kennedy ([id number]) Visit Report for 10/04/2020 Arrival Information Details Patient Name: [name], [name] Date of Service: 10/04/2020 10:00 AM Medical Record Number: [id number] Patient Account Number: [id number] Date of Birth/Sex: [birth year] (75 y.o. M) Treating RN: Dolan Amen Primary Care Provider: Otilio Miu Other Clinician: Referring Provider: Otilio Miu Treating Provider/Extender: Skipper Cliche in Treatment: 5 Visit Information History Since Last Visit Has Compression in Place as Prescribed: Yes Patient Arrived: Cane Pain Present Now: No Arrival Time: 09:59 Accompanied By: self Transfer Assistance: None Patient Identification Verified: Yes Secondary Verification Process Completed: Yes Patient Requires Transmission-Based Precautions: No Patient Has Alerts: No Electronic Signature(s) Signed: 10/04/2020 3:31:49 PM By: Georges Mouse, Minus Breeding RN Entered By: Georges Mouse, Minus Breeding on 10/04/2020 10:00:09 [name] ([id number]) -------------------------------------------------------------------------------- Clinic Level of Care Assessment Details Patient Name: [name] Date of Service: 10/04/2020 10:00 AM Medical Record Number: [id number] Patient Account Number: [id number] Date of Birth/Sex: Apr 01, 1946 (74 y.o. M) Treating RN: Dolan Amen Primary Care Provider: Otilio Miu Other Clinician: Referring Provider: Otilio Miu Treating Provider/Extender: Skipper Cliche in Treatment: 5 Clinic Level of Care Assessment Items TOOL 4 Quantity Score X - Use when only an EandM is performed on FOLLOW-UP visit 1 0 ASSESSMENTS - Nursing Assessment / Reassessment X - Reassessment of Co-morbidities (includes updates in patient status) 1 10 X- 1 5 Reassessment of Adherence to Treatment Plan ASSESSMENTS - Wound and Skin Assessment / Reassessment X - Simple Wound Assessment / Reassessment - one wound 1 5 '[]'  - 0 Complex Wound Assessment / Reassessment -  multiple wounds '[]'  - 0 Dermatologic / Skin Assessment (not related to wound area) ASSESSMENTS - Focused Assessment '[]'  - Circumferential Edema Measurements - multi extremities 0 '[]'  - 0 Nutritional Assessment / Counseling / Intervention '[]'  - 0 Lower Extremity Assessment (monofilament, tuning fork, pulses) '[]'  - 0 Peripheral Arterial Disease Assessment (using hand held doppler) ASSESSMENTS - Ostomy and/or Continence Assessment and Care '[]'  - Incontinence Assessment and Management 0 '[]'  - 0 Ostomy Care Assessment and Management (repouching, etc.) PROCESS - Coordination of Care X - Simple Patient / Family Education for ongoing care 1 15 '[]'  - 0 Complex (extensive) Patient / Family Education for ongoing care '[]'  - 0 Staff obtains Programmer, systems, Records, Test Results / Process Orders '[]'  - 0 Staff telephones HHA, Nursing Homes / Clarify orders / etc '[]'  - 0 Routine Transfer to another Facility (non-emergent condition) '[]'  - 0 Routine Hospital Admission (non-emergent condition) '[]'  - 0 New Admissions / Biomedical engineer / Ordering NPWT, Apligraf, etc. '[]'  - 0 Emergency Hospital Admission (emergent condition) X- 1 10 Simple Discharge Coordination '[]'  - 0 Complex (extensive) Discharge Coordination PROCESS - Special Needs '[]'  - Pediatric / Minor Patient Management 0 '[]'  - 0 Isolation Patient Management '[]'  - 0 Hearing / Language / Visual special needs '[]'  - 0 Assessment of Community assistance (transportation, D/C planning, etc.) '[]'  - 0 Additional assistance / Altered mentation '[]'  - 0 Support Surface(s) Assessment (bed, cushion, seat, etc.) INTERVENTIONS - Wound Cleansing / Measurement Kussman, Michele ([id number]) X- 1 5 Simple Wound Cleansing - one wound '[]'  - 0 Complex Wound Cleansing - multiple wounds X- 1 5 Wound Imaging (photographs - any number of wounds) '[]'  - 0 Wound Tracing (instead of photographs) X- 1 5 Simple Wound Measurement - one wound '[]'  - 0 Complex Wound Measurement  - multiple wounds INTERVENTIONS - Wound Dressings '[]'  - Small Wound Dressing one or multiple wounds 0 X- 1 15 Medium Wound Dressing one or multiple wounds '[]'  - 0  Large Wound Dressing one or multiple wounds '[]'  - 0 Application of Medications - topical '[]'  - 0 Application of Medications - injection INTERVENTIONS - Miscellaneous '[]'  - External ear exam 0 '[]'  - 0 Specimen Collection (cultures, biopsies, blood, body fluids, etc.) '[]'  - 0 Specimen(s) / Culture(s) sent or taken to Lab for analysis '[]'  - 0 Patient Transfer (multiple staff / Civil Service fast streamer / Similar devices) '[]'  - 0 Simple Staple / Suture removal (25 or less) '[]'  - 0 Complex Staple / Suture removal (26 or more) '[]'  - 0 Hypo / Hyperglycemic Management (close monitor of Blood Glucose) '[]'  - 0 Ankle / Brachial Index (ABI) - do not check if billed separately X- 1 5 Vital Signs Has the patient been seen at the hospital within the last three years: Yes Total Score: 80 Level Of Care: New/Established - Level 3 Electronic Signature(s) Signed: 10/04/2020 3:31:49 PM By: Georges Mouse, Minus Breeding RN Entered By: Georges Mouse, Minus Breeding on 10/04/2020 10:39:42 [name] ([id number]) -------------------------------------------------------------------------------- Encounter Discharge Information Details Patient Name: [name] Date of Service: 10/04/2020 10:00 AM Medical Record Number: [id number] Patient Account Number: [id number] Date of Birth/Sex: 24-Oct-1945 (74 y.o. M) Treating RN: Dolan Amen Primary Care Provider: Otilio Miu Other Clinician: Referring Provider: Otilio Miu Treating Provider/Extender: Skipper Cliche in Treatment: 5 Encounter Discharge Information Items Post Procedure Vitals Discharge Condition: Stable Temperature (F): 97.7 Ambulatory Status: Cane Pulse (bpm): 78 Discharge Destination: Home Respiratory Rate (breaths/min): 18 Transportation: Private Auto Blood Pressure (mmHg): 148/79 Accompanied By:  self Schedule Follow-up Appointment: Yes Clinical Summary of Care: Electronic Signature(s) Signed: 10/04/2020 3:31:49 PM By: Georges Mouse, Minus Breeding RN Entered By: Georges Mouse, Minus Breeding on 10/04/2020 10:40:55 [name] ([id number]) -------------------------------------------------------------------------------- Lower Extremity Assessment Details Patient Name: [name] Date of Service: 10/04/2020 10:00 AM Medical Record Number: [id number] Patient Account Number: [id number] Date of Birth/Sex: 1945/10/02 (74 y.o. M) Treating RN: Dolan Amen Primary Care Provider: Otilio Miu Other Clinician: Referring Provider: Otilio Miu Treating Provider/Extender: Jeri Cos Weeks in Treatment: 5 Edema Assessment Assessed: [Left: Yes] [Right: No] Edema: [Left: N] [Right: o] Calf Left: Right: Point of Measurement: 33 cm From Medial Instep 38 cm Ankle Left: Right: Point of Measurement: 10 cm From Medial Instep 24 cm Vascular Assessment Pulses: Dorsalis Pedis Palpable: [Left:Yes] Electronic Signature(s) Signed: 10/04/2020 3:31:49 PM By: Georges Mouse, Minus Breeding RN Entered By: Georges Mouse, Minus Breeding on 10/04/2020 10:10:03 [name] ([id number]) -------------------------------------------------------------------------------- Multi Wound Chart Details Patient Name: [name] Date of Service: 10/04/2020 10:00 AM Medical Record Number: [id number] Patient Account Number: [id number] Date of Birth/Sex: [birth year] (74 y.o. M) Treating RN: Dolan Amen Primary Care Provider: Otilio Miu Other Clinician: Referring Provider: Otilio Miu Treating Provider/Extender: Jeri Cos Weeks in Treatment: 5 Vital Signs Height(in): 71 Pulse(bpm): 15 Weight(lbs): 205 Blood Pressure(mmHg): 148/79 Body Mass Index(BMI): 29 Temperature(F): 97.7 Respiratory Rate(breaths/min): 18 Photos: [N/A:N/A] Wound Location: Left, Lateral Lower Leg N/A N/A Wounding Event: Skin Tear/Laceration  N/A N/A Primary Etiology: Trauma, Other N/A N/A Comorbid History: Human Immunodeficiency Virus, N/A N/A Hypertension Date Acquired: 07/30/2020 N/A N/A Weeks of Treatment: 5 N/A N/A Wound Status: Open N/A N/A Measurements L x W x D (cm) 2.1x2.5x0.1 N/A N/A Area (cm) : 4.123 N/A N/A Volume (cm) : 0.412 N/A N/A % Reduction in Area: 48.80% N/A N/A % Reduction in Volume: 48.80% N/A N/A Classification: Full Thickness Without Exposed N/A N/A Support Structures Exudate Amount: Medium N/A N/A Exudate Type: Sanguinous N/A N/A Exudate Color: red N/A N/A Wound Margin: Flat and Intact N/A N/A Granulation Amount: Large (67-100%)  N/A N/A Granulation Quality: Red N/A N/A Necrotic Amount: None Present (0%) N/A N/A Exposed Structures: Fat Layer (Subcutaneous Tissue): N/A N/A Yes Fascia: No Tendon: No Muscle: No Joint: No Bone: No Epithelialization: Small (1-33%) N/A N/A Treatment Notes Electronic Signature(s) Signed: 10/04/2020 3:31:49 PM By: Georges Mouse, Minus Breeding RN Entered By: Georges Mouse, Minus Breeding on 10/04/2020 10:18:11 [name] ([id number]) -------------------------------------------------------------------------------- Multi-Disciplinary Care Plan Details Patient Name: [name] Date of Service: 10/04/2020 10:00 AM Medical Record Number: [id number] Patient Account Number: [id number] Date of Birth/Sex: [birth year] (74 y.o. 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 Compression as ordered Provide education on venous insufficiency Notes: Wound/Skin Impairment Nursing Diagnoses: Impaired tissue integrity Goals: Ulcer/skin breakdown will have a volume reduction of 30% by week 4 Date Initiated: 08/30/2020 Date Inactivated: 09/27/2020 Target Resolution Date: 09/27/2020 Goal Status: Met Ulcer/skin breakdown will have a volume reduction of 50% by week 8 Date Initiated: 08/30/2020 Target Resolution Date: 10/11/2020 Goal Status: Active Interventions: Assess patient/caregiver ability to obtain necessary supplies Assess patient/caregiver ability to perform ulcer/skin care regimen upon admission and as needed Assess ulceration(s) every visit Treatment Activities: Skin care regimen initiated : 08/30/2020 Topical wound management initiated : 08/30/2020 Notes: Electronic Signature(s) Signed: 10/04/2020 3:31:49 PM By: Georges Mouse, Minus Breeding RN Entered By: Georges Mouse, Minus Breeding on 10/04/2020 10:18:03 [name] ([id number]) -------------------------------------------------------------------------------- Pain Assessment Details Patient Name: [name] Date of Service: 10/04/2020 10:00 AM Medical Record Number: [id number] Patient Account Number: [id number] Date of Birth/Sex: Dec 23, 1945 (75 y.o. M) Treating RN: Dolan Amen Primary Care Provider: Otilio Miu Other Clinician: Referring Provider: Otilio Miu Treating Provider/Extender: Jeri Cos Weeks in Treatment: 5 Active Problems Location of Pain Severity and Description of Pain Patient Has Paino No Site Locations Rate the pain. Current Pain Level: 0 Pain Management and Medication Current Pain Management: Electronic Signature(s) Signed: 10/04/2020 3:31:49 PM By: Georges Mouse, Minus Breeding RN Entered By: Georges Mouse, Minus Breeding on 10/04/2020 10:02:27 [name]  ([id number]) -------------------------------------------------------------------------------- Patient/Caregiver Education Details Patient Name: [name] Date of Service: 10/04/2020 10:00 AM Medical Record Number: [id number] Patient Account Number: [id number] Date of Birth/Gender: 10/24/1945 (74 y.o. M) Treating RN: Dolan Amen Primary Care Physician: Otilio Miu Other Clinician: Referring Physician: Otilio Miu Treating Physician/Extender: Skipper Cliche in Treatment: 5 Education Assessment Education Provided To: Patient Education Topics Provided Wound/Skin Impairment: Methods: Explain/Verbal Responses: State content correctly Electronic Signature(s) Signed: 10/04/2020 3:31:49 PM By: Georges Mouse, Minus Breeding RN Entered By: Georges Mouse, Minus Breeding on 10/04/2020 10:39:57 [name] ([id number]) -------------------------------------------------------------------------------- Wound Assessment Details Patient Name: [name] Date of Service: 10/04/2020 10:00 AM Medical Record Number: [id number] Patient Account Number: [id number] Date of Birth/Sex: November 05, 1945 (74 y.o. M) Treating RN: Dolan Amen Primary Care Provider: Otilio Miu Other Clinician: Referring Provider: Otilio Miu Treating Provider/Extender: Jeri Cos Weeks in Treatment: 5 Wound Status Wound Number: 1 Primary Etiology: Trauma, Other Wound Location: Left, Lateral Lower Leg Wound Status: Open Wounding Event: Skin Tear/Laceration Comorbid History: Human Immunodeficiency Virus, Hypertension Date Acquired: 07/30/2020 Weeks Of Treatment: 5 Clustered Wound: No Photos Wound Measurements Length: (cm) 2.1 Width: (cm) 2.5 Depth: (cm)  0.1 Area: (cm) 4.123 Volume: (cm) 0.412 % Reduction in Area: 48.8% % Reduction in Volume: 48.8% Epithelialization: Small (1-33%) Tunneling: No Undermining: No Wound Description Classification: Full Thickness Without Exposed Support Structures Wound Margin:  Flat and Intact Exudate Amount: Medium Exudate Type: Sanguinous Exudate Color: red Foul Odor After Cleansing: No Slough/Fibrino No Wound Bed Granulation Amount: Large (67-100%) Exposed Structure Granulation Quality: Red Fascia Exposed: No Necrotic Amount: None Present (0%) Fat Layer (Subcutaneous Tissue) Exposed: Yes Tendon Exposed: No Muscle Exposed: No Joint Exposed: No Bone Exposed: No Treatment Notes Wound #1 (Lower Leg) Wound Laterality: Left, Lateral Cleanser Normal Saline Discharge Instruction: Wash your hands with soap and water. Remove old dressing, discard into plastic bag and place into trash. Cleanse the wound with Normal Saline prior to applying a clean dressing using gauze sponges, not tissues or cotton balls. Do not scrub or use excessive force. Pat dry using gauze sponges, not tissue or cotton balls. EULALIO, REAMY ([id number]) Peri-Wound Care Desitin Maximum Strength Ointment 4 (oz) Topical Primary Dressing Hydrofera Blue Ready Transfer Foam, 2.5x2.5 (in/in) Discharge Instruction: Apply Hydrofera Blue Ready to wound bed as directed Secondary Dressing ABD Pad 5x9 (in/in) Discharge Instruction: Cover with ABD pad [place] Discharge Instruction: [place] as directed Secured With 79M [place] Surgical Tape, 2x2 (in/yd) Tubigrip Size E, 3.5x10 (in/yds) Discharge Instruction: Apply 3 Tubigrip E 3-finger-widths below knee to base of toes to secure dressing and/or for swelling. Compression Wrap Compression Stockings Add-Ons Electronic Signature(s) Signed: 10/04/2020 3:31:49 PM By: Georges Mouse, Minus Breeding RN Entered By: Georges Mouse, Minus Breeding on 10/04/2020 10:08:27 [name] ([id number]) -------------------------------------------------------------------------------- Vitals Details Patient Name: [name] Date of Service: 10/04/2020 10:00 AM Medical Record Number: [id number] Patient Account  Number: [id number] Date of Birth/Sex: May 09, 1946 (74 y.o. M) Treating RN: Dolan Amen Primary Care Provider: Otilio Miu Other Clinician: Referring Provider: Otilio Miu Treating Provider/Extender: Jeri Cos Weeks in Treatment: 5 Vital Signs Time Taken: 10:00 Temperature (F): 97.7 Height (in): 71 Pulse (bpm): 78 Weight (lbs): 205 Respiratory Rate (breaths/min): 18 Body Mass Index (BMI): 28.6 Blood Pressure (mmHg): 148/79 Reference Range: 80 - 120 mg / dl Electronic Signature(s) Signed: 10/04/2020 3:31:49 PM By: Georges Mouse, Minus Breeding RN Entered By: Georges Mouse, Minus Breeding on 10/04/2020 10:02:10

## 2020-10-04 NOTE — Progress Notes (Addendum)
[name], [name] ([id number]) Visit Report for 10/04/2020 Chief Complaint Document Details Patient Name: [name], [name] Date of Service: 10/04/2020 10:00 AM Medical Record Number: [id number] Patient Account Number: [id number] Date of Birth/Sex: March 11, 1946 (75 y.o. M) Treating RN: Yevonne PaxEpps, Carrie Primary Care Provider: Elizabeth SauerJones, Deanna Other Clinician: Referring Provider: Elizabeth SauerJones, Deanna Treating Provider/Extender: Rowan BlaseStone, Sashay Felling Weeks in Treatment: 5 Information Obtained from: Patient Chief Complaint Left LE Ulcer Electronic Signature(s) Signed: 10/04/2020 10:00:40 AM By: Lenda KelpStone III, Kelson Queenan PA-C Entered By: Lenda KelpStone III, Jariyah Hackley on 10/04/2020 10:00:40 [name], [name] ([id number]) -------------------------------------------------------------------------------- Debridement Details Patient Name: [name], [name] Date of Service: 10/04/2020 10:00 AM Medical Record Number: [id number] Patient Account Number: [id number] Date of Birth/Sex: March 11, 1946 (74 y.o. M) Treating RN: Rogers BlockerSanchez, Kenia Primary Care Provider: Elizabeth SauerJones, Deanna Other Clinician: Referring Provider: Elizabeth SauerJones, Deanna Treating Provider/Extender: Allen DerryStone, Shayon Trompeter Weeks in Treatment: 5 Debridement Performed for Wound #1 Left,Lateral Lower Leg Assessment: Performed By: Physician Nelida MeuseStone, Kymari Lollis E., PA-C Debridement Type: Chemical/Enzymatic/Mechanical Agent Used: saline and gauze Level of Consciousness (Pre- Awake and Alert procedure): Pre-procedure Verification/Time Out Yes - 10:20 Taken: Start Time: 10:20 Instrument: Other : gauze and saline Bleeding: Minimum Hemostasis Achieved: Pressure Response to Treatment: Procedure was tolerated well Level of Consciousness (Post- Awake and Alert procedure): Post Debridement Measurements of Total Wound Length: (cm) 2.1 Width: (cm) 2.5 Depth: (cm) 0.1 Volume: (cm) 0.412 Character of Wound/Ulcer Post Debridement: Stable Post Procedure Diagnosis Same as Pre-procedure Electronic Signature(s) Signed: 10/04/2020  3:31:49 PM By: Lajean ManesSanchez Pereyda, Kenia RN Signed: 10/04/2020 5:36:08 PM By: Lenda KelpStone III, Keyunna Coco PA-C Entered By: Phillis HaggisSanchez Pereyda, Dondra PraderKenia on 10/04/2020 10:38:25 [name], [name] ([id number]) -------------------------------------------------------------------------------- HPI Details Patient Name: [name], [name] Date of Service: 10/04/2020 10:00 AM Medical Record Number: [id number] Patient Account Number: [id number] Date of Birth/Sex: March 11, 1946 (74 y.o. M) Treating RN: Yevonne PaxEpps, Carrie Primary Care Provider: Elizabeth SauerJones, Deanna Other Clinician: Referring Provider: Elizabeth SauerJones, Deanna Treating Provider/Extender: Rowan BlaseStone, Ahmet Schank Weeks in Treatment: 5 History of Present Illness HPI Description: 08/30/2020 upon evaluation today patient presents today for inspection regarding a wound on his left lower extremity. He does have evidence of some chronic venous insufficiency and hemosiderin staining. With that being said he tells me this began after his stroke though he does not appear to be too swollen today this is good news. Fortunately there is no evidence of active infection at this time. No fevers, chills, nausea, vomiting, or diarrhea. He does have a history of hypertension and occasional elevated blood sugar levels although he does not have a formal diagnosis of diabetes according to what he tells me today. 09/06/2020 upon evaluation today patient appears to be doing well with regard to his leg ulcer. I do feel like the Tubigrip has helped and I do feel like as well but the patient seems to be making good progress all things considered. In the past week this has measured better and seems to be healing quite well that is excellent news. Overall I am extremely pleased at this point. 09/13/2020 upon evaluation today patient appears to be doing well with regard to his wound. He has been tolerating the dressing changes with Hydrofera Blue without complication the wound bed appears to be doing significantly better which is great news.  Overall I'm extremely pleased with where things stand. I do believe he still has a little bit of time to get this to heal just due to the overall size of the wound but nonetheless I think we are headed in the appropriate direction making good progress based on what I'm seeing. 09/20/2020 upon evaluation today patient appears  to be doing well with regard to his leg ulcer. He has been tolerating the dressing changes without complication. Fortunately there is no signs of active infection at this time. No fevers, chills, nausea, vomiting, or diarrhea. 09/27/2020 upon evaluation today patient appears to be doing well with regard to his wound. He has been tolerating the dressing changes without complication. Fortunately there is no signs of active infection which is great news. No fevers, chills, nausea, vomiting, or diarrhea. 10/04/2020 upon evaluation today patient appears to be doing well with regard to his wound. This is showing signs of improvement. The overall measurement is better and I am very pleased in that regard. With that being said were still having issues with the Tubigrip and getting the right sides for him. I am really not certain what exactly is going on here but again what is being sent does not seem to be appropriate for what needs to fit him. Electronic Signature(s) Signed: 10/04/2020 5:24:47 PM By: Lenda Kelp PA-C Entered By: Lenda Kelp on 10/04/2020 17:24:47 [name] ([id number]) -------------------------------------------------------------------------------- Physical Exam Details Patient Name: [name] Date of Service: 10/04/2020 10:00 AM Medical Record Number: [id number] Patient Account Number: [id number] Date of Birth/Sex: 1946/06/07 (74 y.o. M) Treating RN: Yevonne Pax Primary Care Provider: Elizabeth Sauer Other Clinician: Referring Provider: Elizabeth Sauer Treating Provider/Extender: Allen Derry Weeks in Treatment: 5 Constitutional Well-nourished and  well-hydrated in no acute distress. Respiratory normal breathing without difficulty. Psychiatric this patient is able to make decisions and demonstrates good insight into disease process. Alert and Oriented x 3. pleasant and cooperative. Notes Upon inspection patient's wound bed actually showed signs of good granulation epithelization. This is measuring smaller and overall I am very pleased with where we stand today. There is no evidence of active infection at this time. Electronic Signature(s) Signed: 10/04/2020 5:25:02 PM By: Lenda Kelp PA-C Entered By: Lenda Kelp on 10/04/2020 17:25:02 [name] ([id number]) -------------------------------------------------------------------------------- Physician Orders Details Patient Name: [name] Date of Service: 10/04/2020 10:00 AM Medical Record Number: [id number] Patient Account Number: [id number] Date of Birth/Sex: [birth year] (74 y.o. M) Treating RN: Rogers Blocker Primary Care Provider: Elizabeth Sauer Other Clinician: Referring Provider: Elizabeth Sauer Treating Provider/Extender: Rowan Blase in Treatment: 5 Verbal / Phone Orders: No Diagnosis Coding ICD-10 Coding Code Description I87.2 Venous insufficiency (chronic) (peripheral) L97.822 Non-pressure chronic ulcer of other part of left lower leg with fat layer exposed I10 Essential (primary) hypertension R73.9 Hyperglycemia, unspecified Follow-up Appointments Wound #1 Left,Lateral Lower Leg o Return Appointment in 1 week. Anesthetic (Use 'Patient Medications' Section for Anesthetic Order Entry) Wound #1 Left,Lateral Lower Leg o Lidocaine applied to wound bed Wound Treatment Wound #1 - Lower Leg Wound Laterality: Left, Lateral Cleanser: Normal Saline 3 x Per Week/30 Days Discharge Instructions: Wash your hands with soap and water. Remove old dressing, discard into plastic bag and place into trash. Cleanse the wound with Normal Saline prior to applying a  clean dressing using gauze sponges, not tissues or cotton balls. Do not scrub or use excessive force. Pat dry using gauze sponges, not tissue or cotton balls. Peri-Wound Care: Desitin Maximum Strength Ointment 4 (oz) 3 x Per Week/30 Days Primary Dressing: Hydrofera Blue Ready Transfer Foam, 2.5x2.5 (in/in) (DME) (Generic) 3 x Per Week/30 Days Discharge Instructions: Apply Hydrofera Blue Ready to wound bed as directed Secondary Dressing: ABD Pad 5x9 (in/in) 3 x Per Week/30 Days Discharge Instructions: Cover with ABD pad Secondary Dressing: Conforming Guaze Roll-Large 3 x Per Week/30 Days Discharge  Instructions: Apply Conforming Stretch Guaze Bandage as directed Secured With: 74M Medipore H Soft Cloth Surgical Tape, 2x2 (in/yd) 3 x Per Week/30 Days Secured With: Tubigrip Size E, 3.5x10 (in/yds) 3 x Per Week/30 Days Discharge Instructions: Apply 3 Tubigrip E 3-finger-widths below knee to base of toes to secure dressing and/or for swelling. Electronic Signature(s) Signed: 10/04/2020 3:31:49 PM By: Phillis Haggis, Dondra Prader RN Signed: 10/04/2020 5:36:08 PM By: Lenda Kelp PA-C Entered By: Phillis Haggis, Dondra Prader on 10/04/2020 10:39:17 [name] ([id number]) -------------------------------------------------------------------------------- Problem List Details Patient Name: [name] Date of Service: 10/04/2020 10:00 AM Medical Record Number: [id number] Patient Account Number: [id number] Date of Birth/Sex: 1945-12-02 (75 y.o. M) Treating RN: Yevonne Pax Primary Care Provider: Elizabeth Sauer Other Clinician: Referring Provider: Elizabeth Sauer Treating Provider/Extender: Rowan Blase in Treatment: 5 Active Problems ICD-10 Encounter Code Description Active Date MDM Diagnosis I87.2 Venous insufficiency (chronic) (peripheral) 08/30/2020 No Yes L97.822 Non-pressure chronic ulcer of other part of left lower leg with fat layer 08/30/2020 No Yes exposed I10 Essential (primary) hypertension  08/30/2020 No Yes R73.9 Hyperglycemia, unspecified 08/30/2020 No Yes Inactive Problems Resolved Problems Electronic Signature(s) Signed: 10/04/2020 10:00:35 AM By: Lenda Kelp PA-C Entered By: Lenda Kelp on 10/04/2020 10:00:35 [name] ([id number]) -------------------------------------------------------------------------------- Progress Note Details Patient Name: [name] Date of Service: 10/04/2020 10:00 AM Medical Record Number: [id number] Patient Account Number: [id number] Date of Birth/Sex: September 28, 1945 (74 y.o. M) Treating RN: Yevonne Pax Primary Care Provider: Elizabeth Sauer Other Clinician: Referring Provider: Elizabeth Sauer Treating Provider/Extender: Rowan Blase in Treatment: 5 Subjective Chief Complaint Information obtained from Patient Left LE Ulcer History of Present Illness (HPI) 08/30/2020 upon evaluation today patient presents today for inspection regarding a wound on his left lower extremity. He does have evidence of some chronic venous insufficiency and hemosiderin staining. With that being said he tells me this began after his stroke though he does not appear to be too swollen today this is good news. Fortunately there is no evidence of active infection at this time. No fevers, chills, nausea, vomiting, or diarrhea. He does have a history of hypertension and occasional elevated blood sugar levels although he does not have a formal diagnosis of diabetes according to what he tells me today. 09/06/2020 upon evaluation today patient appears to be doing well with regard to his leg ulcer. I do feel like the Tubigrip has helped and I do feel like as well but the patient seems to be making good progress all things considered. In the past week this has measured better and seems to be healing quite well that is excellent news. Overall I am extremely pleased at this point. 09/13/2020 upon evaluation today patient appears to be doing well with regard to his wound.  He has been tolerating the dressing changes with Hydrofera Blue without complication the wound bed appears to be doing significantly better which is great news. Overall I'm extremely pleased with where things stand. I do believe he still has a little bit of time to get this to heal just due to the overall size of the wound but nonetheless I think we are headed in the appropriate direction making good progress based on what I'm seeing. 09/20/2020 upon evaluation today patient appears to be doing well with regard to his leg ulcer. He has been tolerating the dressing changes without complication. Fortunately there is no signs of active infection at this time. No fevers, chills, nausea, vomiting, or diarrhea. 09/27/2020 upon evaluation today patient appears to be doing well  with regard to his wound. He has been tolerating the dressing changes without complication. Fortunately there is no signs of active infection which is great news. No fevers, chills, nausea, vomiting, or diarrhea. 10/04/2020 upon evaluation today patient appears to be doing well with regard to his wound. This is showing signs of improvement. The overall measurement is better and I am very pleased in that regard. With that being said were still having issues with the Tubigrip and getting the right sides for him. I am really not certain what exactly is going on here but again what is being sent does not seem to be appropriate for what needs to fit him. Objective Constitutional Well-nourished and well-hydrated in no acute distress. Vitals Time Taken: 10:00 AM, Height: 71 in, Weight: 205 lbs, BMI: 28.6, Temperature: 97.7 F, Pulse: 78 bpm, Respiratory Rate: 18 breaths/min, Blood Pressure: 148/79 mmHg. Respiratory normal breathing without difficulty. Psychiatric this patient is able to make decisions and demonstrates good insight into disease process. Alert and Oriented x 3. pleasant and cooperative. General Notes: Upon inspection  patient's wound bed actually showed signs of good granulation epithelization. This is measuring smaller and overall I am very pleased with where we stand today. There is no evidence of active infection at this time. Integumentary (Hair, Skin) Wound #1 status is Open. Original cause of wound was Skin Tear/Laceration. The date acquired was: 07/30/2020. The wound has been in treatment 5 weeks. The wound is located on the Left,Lateral Lower Leg. The wound measures 2.1cm length x 2.5cm width x 0.1cm depth; 4.123cm^2 area and 0.412cm^3 volume. There is Fat Layer (Subcutaneous Tissue) exposed. There is no tunneling or undermining noted. There is a medium amount of sanguinous drainage noted. The wound margin is flat and intact. There is large (67-100%) red granulation within the [place], [place] ([id number]) wound bed. There is no necrotic tissue within the wound bed. Assessment Active Problems ICD-10 Venous insufficiency (chronic) (peripheral) Non-pressure chronic ulcer of other part of left lower leg with fat layer exposed Essential (primary) hypertension Hyperglycemia, unspecified Procedures Wound #1 Pre-procedure diagnosis of Wound #1 is a Trauma, Other located on the Left,Lateral Lower Leg . There was a Chemical/Enzymatic/Mechanical debridement performed by Nelida Meuse., PA-C. With the following instrument(s): gauze and saline. Other agent used was saline and gauze. A time out was conducted at 10:20, prior to the start of the procedure. A Minimum amount of bleeding was controlled with Pressure. The procedure was tolerated well. Post Debridement Measurements: 2.1cm length x 2.5cm width x 0.1cm depth; 0.412cm^3 volume. Character of Wound/Ulcer Post Debridement is stable. Post procedure Diagnosis Wound #1: Same as Pre-Procedure Plan Follow-up Appointments: Wound #1 Left,Lateral Lower Leg: Return Appointment in 1 week. Anesthetic (Use 'Patient Medications' Section for Anesthetic Order  Entry): Wound #1 Left,Lateral Lower Leg: Lidocaine applied to wound bed WOUND #1: - Lower Leg Wound Laterality: Left, Lateral Cleanser: Normal Saline 3 x Per Week/30 Days Discharge Instructions: Wash your hands with soap and water. Remove old dressing, discard into plastic bag and place into trash. Cleanse the wound with Normal Saline prior to applying a clean dressing using gauze sponges, not tissues or cotton balls. Do not scrub or use excessive force. Pat dry using gauze sponges, not tissue or cotton balls. Peri-Wound Care: Desitin Maximum Strength Ointment 4 (oz) 3 x Per Week/30 Days Primary Dressing: Hydrofera Blue Ready Transfer Foam, 2.5x2.5 (in/in) (DME) (Generic) 3 x Per Week/30 Days Discharge Instructions: Apply Hydrofera Blue Ready to wound bed as directed Secondary Dressing: ABD  Pad 5x9 (in/in) 3 x Per Week/30 Days Discharge Instructions: Cover with ABD pad Secondary Dressing: Conforming Guaze Roll-Large 3 x Per Week/30 Days Discharge Instructions: Apply Conforming Stretch Guaze Bandage as directed Secured With: 23M Medipore H Soft Cloth Surgical Tape, 2x2 (in/yd) 3 x Per Week/30 Days Secured With: Tubigrip Size E, 3.5x10 (in/yds) 3 x Per Week/30 Days Discharge Instructions: Apply 3 Tubigrip E 3-finger-widths below knee to base of toes to secure dressing and/or for swelling. 1. Would recommend currently that we going continue with the wound care measures as before and the patient is in agreement with the plan. This includes the use of the [facility] dressing which I think has been of utmost benefit. 2. I am also can recommend an ABD pad to cover followed by roll gauze. 3. I would also recommend the Tubigrip be applied following. We will see patient back for reevaluation in 1 week here in the clinic. If anything worsens or changes patient will contact our office for additional recommendations. Electronic Signature(s) Signed: 10/04/2020 5:25:30 PM By: Chesley Mires, [place] ([id number]) Entered By: Lenda Kelp on 10/04/2020 17:25:30 [name] ([id number]) -------------------------------------------------------------------------------- SuperBill Details Patient Name: [name] Date of Service: 10/04/2020 Medical Record Number: [id number] Patient Account Number: [id number] Date of Birth/Sex: 06/03/1946 (74 y.o. M) Treating RN: Rogers Blocker Primary Care Provider: Elizabeth Sauer Other Clinician: Referring Provider: Elizabeth Sauer Treating Provider/Extender: Allen Derry Weeks in Treatment: 5 Diagnosis Coding ICD-10 Codes Code Description I87.2 Venous insufficiency (chronic) (peripheral) L97.822 Non-pressure chronic ulcer of other part of left lower leg with fat layer exposed I10 Essential (primary) hypertension R73.9 Hyperglycemia, unspecified Facility Procedures CPT4 Code: [id number] Description: 99213 - WOUND CARE VISIT-LEV 3 EST PT Modifier: Quantity: 1 Physician Procedures CPT4 Code: [id number] Description: 99213 - WC PHYS LEVEL 3 - EST PT Modifier: Quantity: 1 CPT4 Code: Description: ICD-10 Diagnosis Description I87.2 Venous insufficiency (chronic) (peripheral) L97.822 Non-pressure chronic ulcer of other part of left lower leg with fat lay I10 Essential (primary) hypertension R73.9 Hyperglycemia, unspecified Modifier: er exposed Quantity: Electronic Signature(s) Signed: 10/04/2020 5:25:45 PM By: Lenda Kelp PA-C Previous Signature: 10/04/2020 3:31:49 PM Version By: Phillis Haggis, Dondra Prader RN Entered By: Lenda Kelp on 10/04/2020 17:25:44

## 2020-10-11 ENCOUNTER — Encounter: Payer: Medicare Other | Attending: Physician Assistant | Admitting: Physician Assistant

## 2020-10-11 ENCOUNTER — Other Ambulatory Visit: Payer: Self-pay

## 2020-10-11 DIAGNOSIS — L97822 Non-pressure chronic ulcer of other part of left lower leg with fat layer exposed: Secondary | ICD-10-CM | POA: Diagnosis not present

## 2020-10-11 DIAGNOSIS — I872 Venous insufficiency (chronic) (peripheral): Secondary | ICD-10-CM | POA: Diagnosis not present

## 2020-10-11 DIAGNOSIS — R739 Hyperglycemia, unspecified: Secondary | ICD-10-CM | POA: Insufficient documentation

## 2020-10-11 DIAGNOSIS — I10 Essential (primary) hypertension: Secondary | ICD-10-CM | POA: Insufficient documentation

## 2020-10-11 NOTE — Progress Notes (Addendum)
[name] ([id number]) Visit Report for 10/11/2020 Chief Complaint Document Details Patient Name: [name], [name] Date of Service: 10/11/2020 10:00 AM Medical Record Number: [id number] Patient Account Number: [id number] Date of Birth/Sex: 1946-04-26 (75 y.o. M) Treating RN: Yevonne Pax Primary Care Provider: Elizabeth Sauer Other Clinician: Lolita Cram Referring Provider: Elizabeth Sauer Treating Provider/Extender: Rowan Blase in Treatment: 6 Information Obtained from: Patient Chief Complaint Left LE Ulcer Electronic Signature(s) Signed: 10/11/2020 10:17:25 AM By: Lenda Kelp PA-C Entered By: Lenda Kelp on 10/11/2020 10:17:25 [name] ([id number]) -------------------------------------------------------------------------------- HPI Details Patient Name: [name] Date of Service: 10/11/2020 10:00 AM Medical Record Number: [id number] Patient Account Number: [id number] Date of Birth/Sex: 12/06/1945 (75 y.o. M) Treating RN: Yevonne Pax Primary Care Provider: Elizabeth Sauer Other Clinician: Lolita Cram Referring Provider: Elizabeth Sauer Treating Provider/Extender: Rowan Blase in Treatment: 6 History of Present Illness HPI Description: 08/30/2020 upon evaluation today patient presents today for inspection regarding a wound on his left lower extremity. He does have evidence of some chronic venous insufficiency and hemosiderin staining. With that being said he tells me this began after his stroke though he does not appear to be too swollen today this is good news. Fortunately there is no evidence of active infection at this time. No fevers, chills, nausea, vomiting, or diarrhea. He does have a history of hypertension and occasional elevated blood sugar levels although he does not have a formal diagnosis of diabetes according to what he tells me today. 09/06/2020 upon evaluation today patient appears to be doing well with regard to his leg ulcer. I do feel  like the Tubigrip has helped and I do feel like as well but the patient seems to be making good progress all things considered. In the past week this has measured better and seems to be healing quite well that is excellent news. Overall I am extremely pleased at this point. 09/13/2020 upon evaluation today patient appears to be doing well with regard to his wound. He has been tolerating the dressing changes with Hydrofera Blue without complication the wound bed appears to be doing significantly better which is great news. Overall I'm extremely pleased with where things stand. I do believe he still has a little bit of time to get this to heal just due to the overall size of the wound but nonetheless I think we are headed in the appropriate direction making good progress based on what I'm seeing. 09/20/2020 upon evaluation today patient appears to be doing well with regard to his leg ulcer. He has been tolerating the dressing changes without complication. Fortunately there is no signs of active infection at this time. No fevers, chills, nausea, vomiting, or diarrhea. 09/27/2020 upon evaluation today patient appears to be doing well with regard to his wound. He has been tolerating the dressing changes without complication. Fortunately there is no signs of active infection which is great news. No fevers, chills, nausea, vomiting, or diarrhea. 10/04/2020 upon evaluation today patient appears to be doing well with regard to his wound. This is showing signs of improvement. The overall measurement is better and I am very pleased in that regard. With that being said were still having issues with the Tubigrip and getting the right sides for him. I am really not certain what exactly is going on here but again what is being sent does not seem to be appropriate for what needs to fit him. 10/11/2020 upon evaluation today patient appears to be doing more poorly with some irritation around the  wound itself. The wound is  doing better but again the skin irritation has not. Fortunately there does not appear to be any signs of active infection at this time. No fevers, chills, nausea, vomiting, or diarrhea. Electronic Signature(s) Signed: 10/11/2020 10:34:06 AM By: Lenda Kelp PA-C Entered By: Lenda Kelp on 10/11/2020 10:34:06 [name] ([id number]) -------------------------------------------------------------------------------- Physical Exam Details Patient Name: [name] Date of Service: 10/11/2020 10:00 AM Medical Record Number: [id number] Patient Account Number: [id number] Date of Birth/Sex: [birth year] (75 y.o. M) Treating RN: Yevonne Pax Primary Care Provider: Elizabeth Sauer Other Clinician: Lolita Cram Referring Provider: Elizabeth Sauer Treating Provider/Extender: Allen Derry Weeks in Treatment: 6 Constitutional Well-nourished and well-hydrated in no acute distress. Respiratory normal breathing without difficulty. Psychiatric this patient is able to make decisions and demonstrates good insight into disease process. Alert and Oriented x 3. pleasant and cooperative. Notes Upon inspection today patient's wound bed showed signs of good granulation at this time. There does not appear to be any evidence of active infection which is great news and overall very pleased in that regard. With that being said I feel like the patient is doing worse in regard to the irritation around I believe he needs to have ABD pad not just a nonadherent dressing. Electronic Signature(s) Signed: 10/11/2020 10:34:28 AM By: Lenda Kelp PA-C Entered By: Lenda Kelp on 10/11/2020 10:34:27 [name] ([id number]) -------------------------------------------------------------------------------- Physician Orders Details Patient Name: [name] Date of Service: 10/11/2020 10:00 AM Medical Record Number: [id number] Patient Account Number: [id number] Date of Birth/Sex: May 21, 1946 (75 y.o. M) Treating  RN: Yevonne Pax Primary Care Provider: Elizabeth Sauer Other Clinician: Lolita Cram Referring Provider: Elizabeth Sauer Treating Provider/Extender: Rowan Blase in Treatment: 6 Verbal / Phone Orders: No Diagnosis Coding ICD-10 Coding Code Description I87.2 Venous insufficiency (chronic) (peripheral) L97.822 Non-pressure chronic ulcer of other part of left lower leg with fat layer exposed I10 Essential (primary) hypertension R73.9 Hyperglycemia, unspecified Follow-up Appointments Wound #1 Left,Lateral Lower Leg o Return Appointment in 1 week. Anesthetic (Use 'Patient Medications' Section for Anesthetic Order Entry) Wound #1 Left,Lateral Lower Leg o Lidocaine applied to wound bed Wound Treatment Wound #1 - Lower Leg Wound Laterality: Left, Lateral Cleanser: Normal Saline 3 x Per Week/30 Days Discharge Instructions: Wash your hands with soap and water. Remove old dressing, discard into plastic bag and place into trash. Cleanse the wound with Normal Saline prior to applying a clean dressing using gauze sponges, not tissues or cotton balls. Do not scrub or use excessive force. Pat dry using gauze sponges, not tissue or cotton balls. Peri-Wound Care: Desitin Maximum Strength Ointment 4 (oz) (Generic) 3 x Per Week/30 Days Primary Dressing: Hydrofera Blue Ready Transfer Foam, 2.5x2.5 (in/in) (DME) (Generic) 3 x Per Week/30 Days Discharge Instructions: Apply Hydrofera Blue Ready to wound bed as directed Secondary Dressing: ABD Pad 5x9 (in/in) (DME) (Generic) 3 x Per Week/30 Days Discharge Instructions: Cover with ABD pad Secondary Dressing: Conforming Guaze Roll-Large (DME) (Generic) 3 x Per Week/30 Days Discharge Instructions: Apply Conforming Stretch Guaze Bandage as directed Secured With: 22M Medipore H Soft Cloth Surgical Tape, 2x2 (in/yd) (DME) (Generic) 3 x Per Week/30 Days Secured With: Tubigrip Size G, 4.5x10 (in/yd) (DME) (Generic) 3 x Per Week/30 Days Discharge  Instructions: Apply Tubigrip G 3-finger-widths below knee to base of toes to secure dressing and/or for swelling. Electronic Signature(s) Signed: 10/15/2020 11:27:27 AM By: Lenda Kelp PA-C Signed: 11/12/2020 8:11:40 AM By: Yevonne Pax RN Previous Signature: 10/11/2020 12:29:11 PM Version By: Larina Bras  IIHilary Hertz, Gayle Collard PA-C Previous Signature: 10/13/2020 12:09:52 PM Version By: Yevonne PaxEpps, Carrie RN Entered By: Yevonne PaxEpps, Carrie on 10/15/2020 08:30:17 Mark ParkinsKAZIMIR, Muhanad ([id number]) -------------------------------------------------------------------------------- Problem List Details Patient Name: Mark ParkinsKAZIMIR, Coden Date of Service: 10/11/2020 10:00 AM Medical Record Number: [id number] Patient Account Number: [id number] Date of Birth/Sex: 02-May-1946 (75 y.o. M) Treating RN: Yevonne PaxEpps, Carrie Primary Care Provider: Elizabeth SauerJones, Deanna Other Clinician: Lolita CramBurnette, Kyara Referring Provider: Elizabeth SauerJones, Deanna Treating Provider/Extender: Rowan BlaseStone, Natalie Leclaire Weeks in Treatment: 6 Active Problems ICD-10 Encounter Code Description Active Date MDM Diagnosis I87.2 Venous insufficiency (chronic) (peripheral) 08/30/2020 No Yes L97.822 Non-pressure chronic ulcer of other part of left lower leg with fat layer 08/30/2020 No Yes exposed I10 Essential (primary) hypertension 08/30/2020 No Yes R73.9 Hyperglycemia, unspecified 08/30/2020 No Yes Inactive Problems Resolved Problems Electronic Signature(s) Signed: 10/11/2020 10:17:18 AM By: Lenda KelpStone III, Alaila Pillard PA-C Entered By: Lenda KelpStone III, Tajuanna Burnett on 10/11/2020 10:17:18 Mark ParkinsKAZIMIR, Jaime ([id number]) -------------------------------------------------------------------------------- Progress Note Details Patient Name: Mark ParkinsKAZIMIR, Cora Date of Service: 10/11/2020 10:00 AM Medical Record Number: [id number] Patient Account Number: [id number] Date of Birth/Sex: 02-May-1946 (75 y.o. M) Treating RN: Yevonne PaxEpps, Carrie Primary Care Provider: Elizabeth SauerJones, Deanna Other Clinician: Lolita CramBurnette, Kyara Referring Provider: Elizabeth SauerJones, Deanna Treating  Provider/Extender: Rowan BlaseStone, Rainey Kahrs Weeks in Treatment: 6 Subjective Chief Complaint Information obtained from Patient Left LE Ulcer History of Present Illness (HPI) 08/30/2020 upon evaluation today patient presents today for inspection regarding a wound on his left lower extremity. He does have evidence of some chronic venous insufficiency and hemosiderin staining. With that being said he tells me this began after his stroke though he does not appear to be too swollen today this is good news. Fortunately there is no evidence of active infection at this time. No fevers, chills, nausea, vomiting, or diarrhea. He does have a history of hypertension and occasional elevated blood sugar levels although he does not have a formal diagnosis of diabetes according to what he tells me today. 09/06/2020 upon evaluation today patient appears to be doing well with regard to his leg ulcer. I do feel like the Tubigrip has helped and I do feel like as well but the patient seems to be making good progress all things considered. In the past week this has measured better and seems to be healing quite well that is excellent news. Overall I am extremely pleased at this point. 09/13/2020 upon evaluation today patient appears to be doing well with regard to his wound. He has been tolerating the dressing changes with Hydrofera Blue without complication the wound bed appears to be doing significantly better which is great news. Overall I'm extremely pleased with where things stand. I do believe he still has a little bit of time to get this to heal just due to the overall size of the wound but nonetheless I think we are headed in the appropriate direction making good progress based on what I'm seeing. 09/20/2020 upon evaluation today patient appears to be doing well with regard to his leg ulcer. He has been tolerating the dressing changes without complication. Fortunately there is no signs of active infection at this time. No  fevers, chills, nausea, vomiting, or diarrhea. 09/27/2020 upon evaluation today patient appears to be doing well with regard to his wound. He has been tolerating the dressing changes without complication. Fortunately there is no signs of active infection which is great news. No fevers, chills, nausea, vomiting, or diarrhea. 10/04/2020 upon evaluation today patient appears to be doing well with regard to his wound. This is showing signs of improvement. The overall measurement is better  and I am very pleased in that regard. With that being said were still having issues with the Tubigrip and getting the right sides for him. I am really not certain what exactly is going on here but again what is being sent does not seem to be appropriate for what needs to fit him. 10/11/2020 upon evaluation today patient appears to be doing more poorly with some irritation around the wound itself. The wound is doing better but again the skin irritation has not. Fortunately there does not appear to be any signs of active infection at this time. No fevers, chills, nausea, vomiting, or diarrhea. Objective Constitutional Well-nourished and well-hydrated in no acute distress. Vitals Time Taken: 9:59 AM, Height: 71 in, Weight: 205 lbs, BMI: 28.6, Temperature: 98 F, Pulse: 76 bpm, Respiratory Rate: 16 breaths/min, Blood Pressure: 133/73 mmHg. Respiratory normal breathing without difficulty. Psychiatric this patient is able to make decisions and demonstrates good insight into disease process. Alert and Oriented x 3. pleasant and cooperative. General Notes: Upon inspection today patient's wound bed showed signs of good granulation at this time. There does not appear to be any evidence of active infection which is great news and overall very pleased in that regard. With that being said I feel like the patient is doing worse in regard to the irritation around I believe he needs to have ABD pad not just a nonadherent  dressing. REDFORD, BEHRLE ([id number]) Integumentary (Hair, Skin) Wound #1 status is Open. Original cause of wound was Skin Tear/Laceration. The date acquired was: 07/30/2020. The wound has been in treatment 6 weeks. The wound is located on the Left,Lateral Lower Leg. The wound measures 1.5cm length x 2cm width x 0.1cm depth; 2.356cm^2 area and 0.236cm^3 volume. There is Fat Layer (Subcutaneous Tissue) exposed. There is no tunneling or undermining noted. There is a medium amount of sanguinous drainage noted. The wound margin is flat and intact. There is large (67-100%) red granulation within the wound bed. There is no necrotic tissue within the wound bed. Assessment Active Problems ICD-10 Venous insufficiency (chronic) (peripheral) Non-pressure chronic ulcer of other part of left lower leg with fat layer exposed Essential (primary) hypertension Hyperglycemia, unspecified Plan Follow-up Appointments: Wound #1 Left,Lateral Lower Leg: Return Appointment in 1 week. Anesthetic (Use 'Patient Medications' Section for Anesthetic Order Entry): Wound #1 Left,Lateral Lower Leg: Lidocaine applied to wound bed WOUND #1: - Lower Leg Wound Laterality: Left, Lateral Cleanser: Normal Saline 3 x Per Week/30 Days Discharge Instructions: Wash your hands with soap and water. Remove old dressing, discard into plastic bag and place into trash. Cleanse the wound with Normal Saline prior to applying a clean dressing using gauze sponges, not tissues or cotton balls. Do not scrub or use excessive force. Pat dry using gauze sponges, not tissue or cotton balls. Peri-Wound Care: Desitin Maximum Strength Ointment 4 (oz) (Generic) 3 x Per Week/30 Days Primary Dressing: Hydrofera Blue Ready Transfer Foam, 2.5x2.5 (in/in) (DME) (Generic) 3 x Per Week/30 Days Discharge Instructions: Apply Hydrofera Blue Ready to wound bed as directed Secondary Dressing: ABD Pad 5x9 (in/in) (DME) (Generic) 3 x Per Week/30 Days Discharge  Instructions: Cover with ABD pad Secondary Dressing: Conforming Guaze Roll-Large (DME) (Generic) 3 x Per Week/30 Days Discharge Instructions: Apply Conforming Stretch Guaze Bandage as directed Secured With: 60M Medipore H Soft Cloth Surgical Tape, 2x2 (in/yd) (DME) (Generic) 3 x Per Week/30 Days Secured With: Tubigrip Size E, 3.5x10 (in/yds) 3 x Per Week/30 Days Discharge Instructions: Apply 3 Tubigrip E 3-finger-widths below knee  to base of toes to secure dressing and/or for swelling. 1. With regard to the [facility] I think that still a good way to go. 2. I would recommend as well he should be using an ABD pad to cover followed by roll gauze to secure and then the Tubigrip. 3. I am also can recommend that the patient continue to elevate his legs much as possible try to keep edema under control. 4. He asked if he could attempt to possibly put a waterproof bandage over the wound in order to get into the pool in order to exercise. I explained that if he were to do this he needs to check this at home to make sure the waterproof bandage indeed maintains everything waterproof and if so then he would be fine. Otherwise I think he needs to avoid this at all cost we do not want any fluid getting to the wound which could possibly cause infection. We will see patient back for reevaluation in 1 week here in the clinic. If anything worsens or changes patient will contact our office for additional recommendations. Electronic Signature(s) Signed: 10/11/2020 11:20:25 AM By: Lenda Kelp PA-C Entered By: Lenda Kelp on 10/11/2020 11:20:25 [name] ([id number]) -------------------------------------------------------------------------------- SuperBill Details Patient Name: [name] Date of Service: 10/11/2020 Medical Record Number: [id number] Patient Account Number: [id number] Date of Birth/Sex: Apr 28, 1946 (75 y.o. M) Treating RN: Yevonne Pax Primary Care Provider: Elizabeth Sauer Other  Clinician: Lolita Cram Referring Provider: Elizabeth Sauer Treating Provider/Extender: Rowan Blase in Treatment: 6 Diagnosis Coding ICD-10 Codes Code Description I87.2 Venous insufficiency (chronic) (peripheral) L97.822 Non-pressure chronic ulcer of other part of left lower leg with fat layer exposed I10 Essential (primary) hypertension R73.9 Hyperglycemia, unspecified Facility Procedures CPT4 Code: [id number] Description: 99213 - WOUND CARE VISIT-LEV 3 EST PT Modifier: Quantity: 1 Physician Procedures CPT4 Code: [id number] Description: 99213 - WC PHYS LEVEL 3 - EST PT Modifier: Quantity: 1 CPT4 Code: Description: ICD-10 Diagnosis Description I87.2 Venous insufficiency (chronic) (peripheral) L97.822 Non-pressure chronic ulcer of other part of left lower leg with fat lay I10 Essential (primary) hypertension R73.9 Hyperglycemia, unspecified Modifier: er exposed Quantity: Electronic Signature(s) Signed: 10/11/2020 11:20:55 AM By: Lenda Kelp PA-C Entered By: Lenda Kelp on 10/11/2020 11:20:54

## 2020-10-13 NOTE — Progress Notes (Signed)
[name], [name] ([id number]) Visit Report for 10/11/2020 Arrival Information Details Patient Name: [name] Date of Service: 10/11/2020 10:00 AM Medical Record Number: [id number] Patient Account Number: [id number] Date of Birth/Sex: October 04, 1945 (75 y.o. M) Treating Kennedy: [name] Primary Care [name]: [name] Other Clinician: Jeanine Kennedy Referring Karen Kinnard: [name] Treating Danyka Merlin/Extender: [name] in Treatment: 6 Visit Information History Since Last Visit Pain Present Now: No Patient Arrived: Cane Arrival Time: 09:59 Accompanied By: self Transfer Assistance: None Patient Identification Verified: Yes Secondary Verification Process Completed: Yes Patient Requires Transmission-Based Precautions: No Patient Has Alerts: No Electronic Signature(s) Signed: 10/11/2020 1:33:39 PM By: [name] Entered By: Mark Mouse, Minus Breeding on 10/11/2020 09:59:28 [name] ([id number]) -------------------------------------------------------------------------------- Clinic Level of Care Assessment Details Patient Name: [name] Date of Service: 10/11/2020 10:00 AM Medical Record Number: [id number] Patient Account Number: [id number] Date of Birth/Sex: [birth year] (74 y.o. M) Treating Kennedy: [name] Primary Care Carmencita Cusic: [name] Other Clinician: Jeanine Kennedy Referring [name]: [name] Treating [name]/Extender: [name] in Treatment: 6 Clinic Level of Care Assessment Items TOOL 4 Quantity Score X - Use when only an EandM is performed on FOLLOW-UP visit 1 0 ASSESSMENTS - Nursing Assessment / Reassessment X - Reassessment of Co-morbidities (includes updates in patient status) 1 10 X- 1 5 Reassessment of Adherence to Treatment Plan ASSESSMENTS - Wound and Skin Assessment / Reassessment X - Simple Wound Assessment / Reassessment - one wound 1 5 '[]'  - 0 Complex Wound Assessment / Reassessment - multiple wounds '[]'  -  0 Dermatologic / Skin Assessment (not related to wound area) ASSESSMENTS - Focused Assessment '[]'  - Circumferential Edema Measurements - multi extremities 0 '[]'  - 0 Nutritional Assessment / Counseling / Intervention '[]'  - 0 Lower Extremity Assessment (monofilament, tuning fork, pulses) '[]'  - 0 Peripheral Arterial Disease Assessment (using hand held doppler) ASSESSMENTS - Ostomy and/or Continence Assessment and Care '[]'  - Incontinence Assessment and Management 0 '[]'  - 0 Ostomy Care Assessment and Management (repouching, etc.) PROCESS - Coordination of Care X - Simple Patient / Family Education for ongoing care 1 15 '[]'  - 0 Complex (extensive) Patient / Family Education for ongoing care '[]'  - 0 Staff obtains Programmer, systems, Records, Test Results / Process Orders '[]'  - 0 Staff telephones HHA, Nursing Homes / Clarify orders / etc '[]'  - 0 Routine Transfer to another Facility (non-emergent condition) '[]'  - 0 Routine Hospital Admission (non-emergent condition) '[]'  - 0 New Admissions / Biomedical engineer / Ordering NPWT, Apligraf, etc. '[]'  - 0 Emergency Hospital Admission (emergent condition) X- 1 10 Simple Discharge Coordination '[]'  - 0 Complex (extensive) Discharge Coordination PROCESS - Special Needs '[]'  - Pediatric / Minor Patient Management 0 '[]'  - 0 Isolation Patient Management '[]'  - 0 Hearing / Language / Visual special needs '[]'  - 0 Assessment of Community assistance (transportation, D/C planning, etc.) '[]'  - 0 Additional assistance / Altered mentation '[]'  - 0 Support Surface(s) Assessment (bed, cushion, seat, etc.) INTERVENTIONS - Wound Cleansing / Measurement Kennedy, Mark ([id number]) X- 1 5 Simple Wound Cleansing - one wound '[]'  - 0 Complex Wound Cleansing - multiple wounds X- 1 5 Wound Imaging (photographs - any number of wounds) '[]'  - 0 Wound Tracing (instead of photographs) X- 1 5 Simple Wound Measurement - one wound '[]'  - 0 Complex Wound Measurement - multiple  wounds INTERVENTIONS - Wound Dressings X - Small Wound Dressing one or multiple wounds 1 10 '[]'  - 0 Medium Wound Dressing one or multiple wounds '[]'  - 0 Large Wound  Dressing one or multiple wounds X- 1 5 Application of Medications - topical '[]'  - 0 Application of Medications - injection INTERVENTIONS - Miscellaneous '[]'  - External ear exam 0 '[]'  - 0 Specimen Collection (cultures, biopsies, blood, body fluids, etc.) '[]'  - 0 Specimen(s) / Culture(s) sent or taken to Lab for analysis '[]'  - 0 Patient Transfer (multiple staff / Civil Service fast streamer / Similar devices) '[]'  - 0 Simple Staple / Suture removal (25 or less) '[]'  - 0 Complex Staple / Suture removal (26 or more) '[]'  - 0 Hypo / Hyperglycemic Management (close monitor of Blood Glucose) '[]'  - 0 Ankle / Brachial Index (ABI) - do not check if billed separately X- 1 5 Vital Signs Has the patient been seen at the hospital within the last three years: Yes Total Score: 80 Level Of Care: New/Established - Level 3 Electronic Signature(s) Signed: 10/13/2020 12:09:52 PM By: Mark Coria Kennedy Entered By: [name] on 10/11/2020 10:28:52 [name] ([id number]) -------------------------------------------------------------------------------- Encounter Discharge Information Details Patient Name: [name] Date of Service: 10/11/2020 10:00 AM Medical Record Number: [id number] Patient Account Number: [id number] Date of Birth/Sex: May 16, 1946 (74 y.o. M) Treating Kennedy: [name] Primary Care [name]: [name] Other Clinician: Jeanine Kennedy Referring [name]: [name] Treating [name]/Extender: [name] in Treatment: 6 Encounter Discharge Information Items Discharge Condition: Stable Ambulatory Status: Cane Discharge Destination: Home Transportation: Private Auto Accompanied By: self Schedule Follow-up Appointment: Yes Clinical Summary of Care: Electronic Signature(s) Signed: 10/11/2020 10:45:51 AM By: Mark Cool, BSN, Kennedy, CWS,  Kim Kennedy, BSN Entered By: [name] on 10/11/2020 10:45:51 [name] ([id number]) -------------------------------------------------------------------------------- Lower Extremity Assessment Details Patient Name: [name] Date of Service: 10/11/2020 10:00 AM Medical Record Number: [id number] Patient Account Number: [id number] Date of Birth/Sex: 1945-12-22 (74 y.o. M) Treating Kennedy: [name] Primary Care Liyana Suniga: [name] Other Clinician: Jeanine Kennedy Referring Georgette Helmer: [name] Treating Tyshell Ramberg/Extender: Jeri Cos Weeks in Treatment: 6 Edema Assessment Assessed: Shirlyn Goltz: Yes] [Right: No] Edema: [Left: N] [Right: o] Calf Left: Right: Point of Measurement: 33 cm From Medial Instep 37.5 cm Ankle Left: Right: Point of Measurement: 10 cm From Medial Instep 23.2 cm Vascular Assessment Pulses: Dorsalis Pedis Palpable: [Left:Yes] Electronic Signature(s) Signed: 10/11/2020 1:33:39 PM By: [name] Entered By: Mark Mouse, Minus Breeding on 10/11/2020 10:06:57 [name] ([id number]) -------------------------------------------------------------------------------- Multi Wound Chart Details Patient Name: [name] Date of Service: 10/11/2020 10:00 AM Medical Record Number: [id number] Patient Account Number: [id number] Date of Birth/Sex: 25-Jul-1946 (75 y.o. M) Treating Kennedy: [name] Primary Care Laneta Guerin: [name] Other Clinician: Jeanine Kennedy Referring Stephani Janak: [name] Treating Dequane Strahan/Extender: [name] in Treatment: 6 Vital Signs Height(in): 71 Pulse(bpm): 15 Weight(lbs): 205 Blood Pressure(mmHg): 133/73 Body Mass Index(BMI): 29 Temperature(F): 98 Respiratory Rate(breaths/min): 16 Photos: [N/A:N/A] Wound Location: Left, Lateral Lower Leg N/A N/A Wounding Event: Skin Tear/Laceration N/A N/A Primary Etiology: Trauma, Other N/A N/A Comorbid History: Human Immunodeficiency Virus, N/A  N/A Hypertension Date Acquired: 07/30/2020 N/A N/A Weeks of Treatment: 6 N/A N/A Wound Status: Open N/A N/A Measurements L x W x D (cm) 1.5x2x0.1 N/A N/A Area (cm) : 2.356 N/A N/A Volume (cm) : 0.236 N/A N/A % Reduction in Area: 70.70% N/A N/A % Reduction in Volume: 70.70% N/A N/A Classification: Full Thickness Without Exposed N/A N/A Support Structures Exudate Amount: Medium N/A N/A Exudate Type: Sanguinous N/A N/A Exudate Color: red N/A N/A Wound Margin: Flat and Intact N/A N/A Granulation Amount: Large (67-100%) N/A N/A Granulation Quality: Red N/A N/A Necrotic Amount: None  Present (0%) N/A N/A Exposed Structures: Fat Layer (Subcutaneous Tissue): N/A N/A Yes Fascia: No Tendon: No Muscle: No Joint: No Bone: No Epithelialization: Medium (34-66%) N/A N/A Treatment Notes Electronic Signature(s) Signed: 10/13/2020 12:09:52 PM By: Mark Coria Kennedy Entered By: [name] on 10/11/2020 10:25:39 [name] ([id number]) -------------------------------------------------------------------------------- Multi-Disciplinary Care Plan Details Patient Name: [name] Date of Service: 10/11/2020 10:00 AM Medical Record Number: [id number] Patient Account Number: [id number] Date of Birth/Sex: [birth year] (75 y.o. 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 Compression as ordered Provide education on venous insufficiency Notes: Electronic Signature(s) Signed: 10/13/2020 12:09:52 PM By: Mark Coria Kennedy Entered By: [name] on 10/11/2020 10:25:23 [name] ([id number]) -------------------------------------------------------------------------------- Pain Assessment Details Patient Name: [name] Date of Service: 10/11/2020 10:00 AM Medical Record Number: [id number] Patient Account Number: [id number] Date of Birth/Sex: June 24, 1946 (75 y.o. M) Treating Kennedy: [name] Primary Care Carla Whilden: [name] Other Clinician: Jeanine Kennedy Referring Lamoyne Hessel: [name] Treating Magdeline Prange/Extender: [name] in Treatment: 6 Active Problems Location of Pain Severity and Description of Pain Patient Has Paino No Site Locations Rate the pain. Current Pain Level: 0 Pain Management and Medication Current Pain Management: Electronic Signature(s) Signed: 10/11/2020 1:33:39 PM By: [name] Entered By: Mark Mouse, Minus Breeding on 10/11/2020 10:02:20 [name] ([id number]) -------------------------------------------------------------------------------- Patient/Caregiver Education Details Patient Name: [name] Date of Service: 10/11/2020 10:00 AM Medical Record Number: [id number] Patient Account Number: [id number] Date of Birth/Gender: 05/09/1946 (75 y.o. M) Treating Kennedy: [name] Primary Care Physician: [name] Other Clinician: Jeanine Kennedy Referring Physician: Otilio Kennedy Treating Physician/Extender: [name] in Treatment: 6 Education Assessment Education Provided To: Patient Education Topics Provided Venous: Methods: Explain/Verbal Responses: State content correctly Electronic Signature(s) Signed: 10/13/2020 12:09:52 PM By: Mark Coria Kennedy Entered By: [name] on 10/11/2020 10:31:18 [name]  ([id number]) -------------------------------------------------------------------------------- Wound Assessment Details Patient Name: [name] Date of Service: 10/11/2020 10:00 AM Medical Record Number: [id number] Patient Account Number: [id number] Date of Birth/Sex: Aug 03, 1946 (76 y.o. M) Treating Kennedy: [name] Primary Care Classie Weng: [name] Other Clinician: Jeanine Kennedy Referring Yochanan Eddleman: [name] Treating Shavonte Zhao/Extender: Jeri Cos Weeks in Treatment: 6 Wound Status Wound Number: 1 Primary Etiology: Trauma, Other Wound Location: Left, Lateral Lower Leg Wound Status: Open Wounding Event: Skin Tear/Laceration Comorbid History: Human Immunodeficiency Virus, Hypertension Date Acquired: 07/30/2020 Weeks Of Treatment: 6 Clustered Wound: No Photos Wound Measurements Length: (cm) 1.5 Width: (cm) 2 Depth: (cm) 0.1 Area: (cm) 2.356 Volume: (cm) 0.236 % Reduction in Area: 70.7% % Reduction in Volume: 70.7% Epithelialization: Medium (34-66%) Tunneling: No Undermining: No Wound Description Classification: Full Thickness Without Exposed Support Structures Wound Margin: Flat and Intact Exudate Amount: Medium Exudate Type: Sanguinous Exudate Color: red Foul Odor After Cleansing: No Slough/Fibrino No Wound Bed Granulation Amount: Large (67-100%) Exposed Structure Granulation Quality: Red Fascia Exposed: No Necrotic Amount: None Present (0%) Fat Layer (Subcutaneous Tissue) Exposed: Yes Tendon Exposed: No Muscle Exposed: No Joint Exposed: No Bone Exposed: No Treatment Notes Wound #1 (Lower Leg) Wound Laterality: Left, Lateral Cleanser Normal Saline  Discharge Instruction: Wash your hands with soap and water. Remove old dressing, discard into plastic bag and place into trash. Cleanse the wound with Normal Saline prior to applying a clean dressing using gauze sponges, not tissues or cotton balls. Do not scrub or use excessive force. Pat dry using  gauze sponges, not tissue or cotton balls. VENICE, LIZ ([id number]) Peri-Wound Care Desitin Maximum Strength Ointment 4 (oz) Topical Primary Dressing Hydrofera Blue Ready Transfer Foam, 2.5x2.5 (in/in) Discharge Instruction: Apply Hydrofera Blue Ready to wound bed as directed Secondary Dressing ABD Pad 5x9 (in/in) Discharge Instruction: Cover with ABD pad [place] Discharge Instruction: [place] as directed Secured With 46M [place] Surgical Tape, 2x2 (in/yd) Tubigrip Size E, 3.5x10 (in/yds) Discharge Instruction: Apply 3 Tubigrip E 3-finger-widths below knee to base of toes to secure dressing and/or for swelling. Compression Wrap Compression Stockings Add-Ons Electronic Signature(s) Signed: 10/11/2020 1:33:39 PM By: [name] Entered By: Mark Mouse, Minus Breeding on 10/11/2020 10:20:24 [name] ([id number]) -------------------------------------------------------------------------------- Vitals Details Patient Name: [name] Date of Service: 10/11/2020 10:00 AM Medical Record Number: [id number] Patient Account Number: [id number] Date of Birth/Sex: 1946/02/02 (75 y.o. M) Treating Kennedy: [name] Primary Care Jaxxen Voong: [name] Other Clinician: Jeanine Kennedy Referring Amilya Haver: [name] Treating Casady Voshell/Extender: [name] in Treatment: 6 Vital Signs Time Taken: 09:59 Temperature (F): 98 Height (in): 71 Pulse (bpm): 76 Weight (lbs): 205 Respiratory Rate (breaths/min): 16 Body Mass Index (BMI): 28.6 Blood Pressure (mmHg): 133/73 Reference Range: 80 - 120 mg / dl Electronic Signature(s) Signed: 10/11/2020 1:33:39 PM By: [name] Entered By: Mark Mouse, Minus Breeding on 10/11/2020 09:59:45

## 2020-10-18 ENCOUNTER — Encounter: Payer: Medicare Other | Admitting: Physician Assistant

## 2020-10-18 ENCOUNTER — Other Ambulatory Visit: Payer: Self-pay

## 2020-10-18 DIAGNOSIS — I10 Essential (primary) hypertension: Secondary | ICD-10-CM | POA: Diagnosis not present

## 2020-10-18 DIAGNOSIS — I872 Venous insufficiency (chronic) (peripheral): Secondary | ICD-10-CM | POA: Diagnosis not present

## 2020-10-18 DIAGNOSIS — R739 Hyperglycemia, unspecified: Secondary | ICD-10-CM | POA: Diagnosis not present

## 2020-10-18 DIAGNOSIS — L97822 Non-pressure chronic ulcer of other part of left lower leg with fat layer exposed: Secondary | ICD-10-CM | POA: Diagnosis not present

## 2020-10-18 NOTE — Progress Notes (Addendum)
[name], Narciso ([id number]) Visit Report for 10/18/2020 Chief Complaint Document Details Patient Name: [name], [name] Date of Service: 10/18/2020 10:00 AM Medical Record Number: [id number] Patient Account Number: [id number] Date of Birth/Sex: [birth year] (75 y.o. M) Treating RN: Yevonne PaxEpps, Carrie Primary Care Provider: Elizabeth SauerJones, Deanna Other Clinician: Referring Provider: Elizabeth SauerJones, Deanna Treating Provider/Extender: Rowan BlaseStone, Charlean Carneal Weeks in Treatment: 7 Information Obtained from: Patient Chief Complaint Left LE Ulcer Electronic Signature(s) Signed: 10/18/2020 10:27:48 AM By: Lenda KelpStone III, Okie Jansson PA-C Entered By: Lenda KelpStone III, Bobbie Virden on 10/18/2020 10:27:48 [name], [name] ([id number]) -------------------------------------------------------------------------------- HPI Details Patient Name: [name], Haston Date of Service: 10/18/2020 10:00 AM Medical Record Number: [id number] Patient Account Number: [id number] Date of Birth/Sex: [birth year] (74 y.o. M) Treating RN: Yevonne PaxEpps, Carrie Primary Care Provider: Elizabeth SauerJones, Deanna Other Clinician: Referring Provider: Elizabeth SauerJones, Deanna Treating Provider/Extender: Rowan BlaseStone, Javani Spratt Weeks in Treatment: 7 History of Present Illness HPI Description: 08/30/2020 upon evaluation today patient presents today for inspection regarding a wound on his left lower extremity. He does have evidence of some chronic venous insufficiency and hemosiderin staining. With that being said he tells me this began after his stroke though he does not appear to be too swollen today this is good news. Fortunately there is no evidence of active infection at this time. No fevers, chills, nausea, vomiting, or diarrhea. He does have a history of hypertension and occasional elevated blood sugar levels although he does not have a formal diagnosis of diabetes according to what he tells me today. 09/06/2020 upon evaluation today patient appears to be doing well with regard to his leg ulcer. I do feel like the Tubigrip has helped and  I do feel like as well but the patient seems to be making good progress all things considered. In the past week this has measured better and seems to be healing quite well that is excellent news. Overall I am extremely pleased at this point. 09/13/2020 upon evaluation today patient appears to be doing well with regard to his wound. He has been tolerating the dressing changes with Hydrofera Blue without complication the wound bed appears to be doing significantly better which is great news. Overall I'm extremely pleased with where things stand. I do believe he still has a little bit of time to get this to heal just due to the overall size of the wound but nonetheless I think we are headed in the appropriate direction making good progress based on what I'm seeing. 09/20/2020 upon evaluation today patient appears to be doing well with regard to his leg ulcer. He has been tolerating the dressing changes without complication. Fortunately there is no signs of active infection at this time. No fevers, chills, nausea, vomiting, or diarrhea. 09/27/2020 upon evaluation today patient appears to be doing well with regard to his wound. He has been tolerating the dressing changes without complication. Fortunately there is no signs of active infection which is great news. No fevers, chills, nausea, vomiting, or diarrhea. 10/04/2020 upon evaluation today patient appears to be doing well with regard to his wound. This is showing signs of improvement. The overall measurement is better and I am very pleased in that regard. With that being said were still having issues with the Tubigrip and getting the right sides for him. I am really not certain what exactly is going on here but again what is being sent does not seem to be appropriate for what needs to fit him. 10/11/2020 upon evaluation today patient appears to be doing more poorly with some irritation around the wound itself. The wound  is doing better but again the skin  irritation has not. Fortunately there does not appear to be any signs of active infection at this time. No fevers, chills, nausea, vomiting, or diarrhea. 10/18/2020 upon evaluation today patient appears to be doing well with regard to his wound. Is been tolerating the dressing changes without complication. Fortunately there is no signs of active infection I do feel like putting the zinc on this area has done well for protecting over the past week. I am very pleased in that regard. Electronic Signature(s) Signed: 10/18/2020 10:58:53 AM By: Lenda Kelp PA-C Entered By: Lenda Kelp on 10/18/2020 10:58:52 Mark Parkins ([id number]) -------------------------------------------------------------------------------- Physical Exam Details Patient Name: Mark Parkins Date of Service: 10/18/2020 10:00 AM Medical Record Number: [id number] Patient Account Number: [id number] Date of Birth/Sex: Mar 09, 1946 (74 y.o. M) Treating RN: Yevonne Pax Primary Care Provider: Elizabeth Sauer Other Clinician: Referring Provider: Elizabeth Sauer Treating Provider/Extender: Allen Derry Weeks in Treatment: 7 Constitutional Well-nourished and well-hydrated in no acute distress. Respiratory normal breathing without difficulty. Psychiatric this patient is able to make decisions and demonstrates good insight into disease process. Alert and Oriented x 3. pleasant and cooperative. Notes Patient's wound bed showed signs of good granulation epithelization at this point. There does not appear to be any evidence of active infection which is great news and overall he is making good progress. The wound is measuring smaller. Electronic Signature(s) Signed: 10/18/2020 10:59:07 AM By: Lenda Kelp PA-C Entered By: Lenda Kelp on 10/18/2020 10:59:07 Mark Parkins ([id number]) -------------------------------------------------------------------------------- Physician Orders Details Patient Name: Mark Parkins Date of  Service: 10/18/2020 10:00 AM Medical Record Number: [id number] Patient Account Number: [id number] Date of Birth/Sex: 05-Apr-1946 (74 y.o. M) Treating RN: Yevonne Pax Primary Care Provider: Elizabeth Sauer Other Clinician: Referring Provider: Elizabeth Sauer Treating Provider/Extender: Rowan Blase in Treatment: 7 Verbal / Phone Orders: No Diagnosis Coding ICD-10 Coding Code Description I87.2 Venous insufficiency (chronic) (peripheral) L97.822 Non-pressure chronic ulcer of other part of left lower leg with fat layer exposed I10 Essential (primary) hypertension R73.9 Hyperglycemia, unspecified Follow-up Appointments Wound #1 Left,Lateral Lower Leg o Return Appointment in 1 week. Anesthetic (Use 'Patient Medications' Section for Anesthetic Order Entry) Wound #1 Left,Lateral Lower Leg o Lidocaine applied to wound bed Wound Treatment Wound #1 - Lower Leg Wound Laterality: Left, Lateral Cleanser: Normal Saline 3 x Per Week/30 Days Discharge Instructions: Wash your hands with soap and water. Remove old dressing, discard into plastic bag and place into trash. Cleanse the wound with Normal Saline prior to applying a clean dressing using gauze sponges, not tissues or cotton balls. Do not scrub or use excessive force. Pat dry using gauze sponges, not tissue or cotton balls. Peri-Wound Care: Desitin Maximum Strength Ointment 4 (oz) (Generic) 3 x Per Week/30 Days Primary Dressing: Hydrofera Blue Ready Transfer Foam, 2.5x2.5 (in/in) (Generic) 3 x Per Week/30 Days Discharge Instructions: Apply Hydrofera Blue Ready to wound bed as directed Secondary Dressing: ABD Pad 5x9 (in/in) (Generic) 3 x Per Week/30 Days Discharge Instructions: Cover with ABD pad Secondary Dressing: Conforming Guaze Roll-Large (Generic) 3 x Per Week/30 Days Discharge Instructions: Apply Conforming Stretch Guaze Bandage as directed Secured With: 32M Medipore H Soft Cloth Surgical Tape, 2x2 (in/yd) (Generic) 3 x Per Week/30  Days Secured With: Tubigrip Size G, 4.5x10 (in/yd) (Generic) 3 x Per Week/30 Days Discharge Instructions: Apply Tubigrip G 3-finger-widths below knee to base of toes to secure dressing and/or for swelling. Electronic Signature(s) Signed: 10/18/2020 4:25:37 PM By: Lenda Kelp PA-C  Signed: 10/19/2020 4:27:39 PM By: Yevonne Pax RN Entered By: Yevonne Pax on 10/18/2020 10:52:39 Mark Parkins ([id number]) -------------------------------------------------------------------------------- Problem List Details Patient Name: Mark Parkins Date of Service: 10/18/2020 10:00 AM Medical Record Number: [id number] Patient Account Number: [id number] Date of Birth/Sex: [birth year] (75 y.o. M) Treating RN: Yevonne Pax Primary Care Provider: Elizabeth Sauer Other Clinician: Referring Provider: Elizabeth Sauer Treating Provider/Extender: Rowan Blase in Treatment: 7 Active Problems ICD-10 Encounter Code Description Active Date MDM Diagnosis I87.2 Venous insufficiency (chronic) (peripheral) 08/30/2020 No Yes L97.822 Non-pressure chronic ulcer of other part of left lower leg with fat layer 08/30/2020 No Yes exposed I10 Essential (primary) hypertension 08/30/2020 No Yes R73.9 Hyperglycemia, unspecified 08/30/2020 No Yes Inactive Problems Resolved Problems Electronic Signature(s) Signed: 10/18/2020 10:27:37 AM By: Lenda Kelp PA-C Entered By: Lenda Kelp on 10/18/2020 10:27:36 Mark Parkins ([id number]) -------------------------------------------------------------------------------- Progress Note Details Patient Name: Mark Parkins Date of Service: 10/18/2020 10:00 AM Medical Record Number: [id number] Patient Account Number: [id number] Date of Birth/Sex: 12-14-1945 (74 y.o. M) Treating RN: Yevonne Pax Primary Care Provider: Elizabeth Sauer Other Clinician: Referring Provider: Elizabeth Sauer Treating Provider/Extender: Rowan Blase in Treatment: 7 Subjective Chief  Complaint Information obtained from Patient Left LE Ulcer History of Present Illness (HPI) 08/30/2020 upon evaluation today patient presents today for inspection regarding a wound on his left lower extremity. He does have evidence of some chronic venous insufficiency and hemosiderin staining. With that being said he tells me this began after his stroke though he does not appear to be too swollen today this is good news. Fortunately there is no evidence of active infection at this time. No fevers, chills, nausea, vomiting, or diarrhea. He does have a history of hypertension and occasional elevated blood sugar levels although he does not have a formal diagnosis of diabetes according to what he tells me today. 09/06/2020 upon evaluation today patient appears to be doing well with regard to his leg ulcer. I do feel like the Tubigrip has helped and I do feel like as well but the patient seems to be making good progress all things considered. In the past week this has measured better and seems to be healing quite well that is excellent news. Overall I am extremely pleased at this point. 09/13/2020 upon evaluation today patient appears to be doing well with regard to his wound. He has been tolerating the dressing changes with Hydrofera Blue without complication the wound bed appears to be doing significantly better which is great news. Overall I'm extremely pleased with where things stand. I do believe he still has a little bit of time to get this to heal just due to the overall size of the wound but nonetheless I think we are headed in the appropriate direction making good progress based on what I'm seeing. 09/20/2020 upon evaluation today patient appears to be doing well with regard to his leg ulcer. He has been tolerating the dressing changes without complication. Fortunately there is no signs of active infection at this time. No fevers, chills, nausea, vomiting, or diarrhea. 09/27/2020 upon evaluation today  patient appears to be doing well with regard to his wound. He has been tolerating the dressing changes without complication. Fortunately there is no signs of active infection which is great news. No fevers, chills, nausea, vomiting, or diarrhea. 10/04/2020 upon evaluation today patient appears to be doing well with regard to his wound. This is showing signs of improvement. The overall measurement is better and I am very pleased in that regard. With  that being said were still having issues with the Tubigrip and getting the right sides for him. I am really not certain what exactly is going on here but again what is being sent does not seem to be appropriate for what needs to fit him. 10/11/2020 upon evaluation today patient appears to be doing more poorly with some irritation around the wound itself. The wound is doing better but again the skin irritation has not. Fortunately there does not appear to be any signs of active infection at this time. No fevers, chills, nausea, vomiting, or diarrhea. 10/18/2020 upon evaluation today patient appears to be doing well with regard to his wound. Is been tolerating the dressing changes without complication. Fortunately there is no signs of active infection I do feel like putting the zinc on this area has done well for protecting over the past week. I am very pleased in that regard. Objective Constitutional Well-nourished and well-hydrated in no acute distress. Vitals Time Taken: 10:02 AM, Height: 71 in, Weight: 205 lbs, BMI: 28.6, Temperature: 98.3 F, Pulse: 69 bpm, Respiratory Rate: 18 breaths/min, Blood Pressure: 131/75 mmHg. Respiratory normal breathing without difficulty. Psychiatric this patient is able to make decisions and demonstrates good insight into disease process. Alert and Oriented x 3. pleasant and cooperative. BENNEY, [place] ([id number]) General Notes: Patient's wound bed showed signs of good granulation epithelization at this point. There  does not appear to be any evidence of active infection which is great news and overall he is making good progress. The wound is measuring smaller. Integumentary (Hair, Skin) Wound #1 status is Open. Original cause of wound was Skin Tear/Laceration. The date acquired was: 07/30/2020. The wound has been in treatment 7 weeks. The wound is located on the Left,Lateral Lower Leg. The wound measures 1.5cm length x 1.6cm width x 0.1cm depth; 1.885cm^2 area and 0.188cm^3 volume. There is Fat Layer (Subcutaneous Tissue) exposed. There is no tunneling or undermining noted. There is a medium amount of sanguinous drainage noted. The wound margin is flat and intact. There is large (67-100%) red granulation within the wound bed. There is no necrotic tissue within the wound bed. Assessment Active Problems ICD-10 Venous insufficiency (chronic) (peripheral) Non-pressure chronic ulcer of other part of left lower leg with fat layer exposed Essential (primary) hypertension Hyperglycemia, unspecified Plan Follow-up Appointments: Wound #1 Left,Lateral Lower Leg: Return Appointment in 1 week. Anesthetic (Use 'Patient Medications' Section for Anesthetic Order Entry): Wound #1 Left,Lateral Lower Leg: Lidocaine applied to wound bed WOUND #1: - Lower Leg Wound Laterality: Left, Lateral Cleanser: Normal Saline 3 x Per Week/30 Days Discharge Instructions: Wash your hands with soap and water. Remove old dressing, discard into plastic bag and place into trash. Cleanse the wound with Normal Saline prior to applying a clean dressing using gauze sponges, not tissues or cotton balls. Do not scrub or use excessive force. Pat dry using gauze sponges, not tissue or cotton balls. Peri-Wound Care: Desitin Maximum Strength Ointment 4 (oz) (Generic) 3 x Per Week/30 Days Primary Dressing: Hydrofera Blue Ready Transfer Foam, 2.5x2.5 (in/in) (Generic) 3 x Per Week/30 Days Discharge Instructions: Apply Hydrofera Blue Ready to wound  bed as directed Secondary Dressing: ABD Pad 5x9 (in/in) (Generic) 3 x Per Week/30 Days Discharge Instructions: Cover with ABD pad Secondary Dressing: Conforming Guaze Roll-Large (Generic) 3 x Per Week/30 Days Discharge Instructions: Apply Conforming Stretch Guaze Bandage as directed Secured With: 78M Medipore H Soft Cloth Surgical Tape, 2x2 (in/yd) (Generic) 3 x Per Week/30 Days Secured With: Tubigrip Size G,  4.5x10 (in/yd) (Generic) 3 x Per Week/30 Days Discharge Instructions: Apply Tubigrip G 3-finger-widths below knee to base of toes to secure dressing and/or for swelling. 1. Would recommend that we continue with the wound care measures as before and the patient is in agreement the plan this includes the use of Hydrofera Blue that we will use a good amount of liberal zinc around the edges of the wound to prevent it from sticking. 2. I am also can recommend that we continue with the ABD pad to cover followed by roll gauze to secure. 3. I am also going to suggest that we use the Tubigrip E over the patient's dressings here in order to hold everything in place. We will see patient back for reevaluation in 1 week here in the clinic. If anything worsens or changes patient will contact our office for additional recommendations. Electronic Signature(s) Signed: 10/18/2020 10:59:41 AM By: Lenda Kelp PA-C Entered By: Lenda Kelp on 10/18/2020 10:59:40 Mark Parkins ([id number]) -------------------------------------------------------------------------------- SuperBill Details Patient Name: Mark Parkins Date of Service: 10/18/2020 Medical Record Number: [id number] Patient Account Number: [id number] Date of Birth/Sex: 1946-05-21 (74 y.o. M) Treating RN: Yevonne Pax Primary Care Provider: Elizabeth Sauer Other Clinician: Referring Provider: Elizabeth Sauer Treating Provider/Extender: Rowan Blase in Treatment: 7 Diagnosis Coding ICD-10 Codes Code Description I87.2 Venous insufficiency  (chronic) (peripheral) L97.822 Non-pressure chronic ulcer of other part of left lower leg with fat layer exposed I10 Essential (primary) hypertension R73.9 Hyperglycemia, unspecified Facility Procedures CPT4 Code: [id number] Description: 99213 - WOUND CARE VISIT-LEV 3 EST PT Modifier: Quantity: 1 Physician Procedures CPT4 Code: [id number] Description: 99213 - WC PHYS LEVEL 3 - EST PT Modifier: Quantity: 1 CPT4 Code: Description: ICD-10 Diagnosis Description I87.2 Venous insufficiency (chronic) (peripheral) L97.822 Non-pressure chronic ulcer of other part of left lower leg with fat lay I10 Essential (primary) hypertension R73.9 Hyperglycemia, unspecified Modifier: er exposed Quantity: Electronic Signature(s) Signed: 10/18/2020 11:10:34 AM By: Lenda Kelp PA-C Entered By: Lenda Kelp on 10/18/2020 11:10:34

## 2020-10-19 NOTE — Progress Notes (Signed)
Mark, Kennedy ([id number]) Visit Report for 10/18/2020 Arrival Information Details Patient Name: [name], [name] Date of Service: 10/18/2020 10:00 AM Medical Record Number: [id number] Patient Account Number: [id number] Date of Birth/Sex: 1945-11-14 (75 y.o. M) Treating RN: Dolan Amen Primary Care Jabar Krysiak: Otilio Miu Other Clinician: Referring Kerissa Coia: Otilio Miu Treating Melizza Kanode/Extender: Skipper Cliche in Treatment: 7 Visit Information History Since Last Visit Had a fall or experienced change in No Patient Arrived: Kasandra Knudsen activities of daily living that may affect Arrival Time: 10:02 risk of falls: Accompanied By: self Hospitalized since last visit: No Transfer Assistance: None Has Dressing in Place as Prescribed: Yes Patient Identification Verified: Yes Pain Present Now: No Secondary Verification Process Completed: Yes Patient Requires Transmission-Based Precautions: No Patient Has Alerts: No Electronic Signature(s) Signed: 10/18/2020 4:55:56 PM By: Georges Mouse, Minus Breeding RN Entered By: Georges Mouse, Minus Breeding on 10/18/2020 10:02:42 [name] ([id number]) -------------------------------------------------------------------------------- Clinic Level of Care Assessment Details Patient Name: [name] Date of Service: 10/18/2020 10:00 AM Medical Record Number: [id number] Patient Account Number: [id number] Date of Birth/Sex: 1945/08/20 (74 y.o. M) Treating RN: Carlene Coria Primary Care Sohana Austell: Otilio Miu Other Clinician: Referring Adithi Gammon: Otilio Miu Treating Orlandus Borowski/Extender: Skipper Cliche in Treatment: 7 Clinic Level of Care Assessment Items TOOL 4 Quantity Score X - Use when only an EandM is performed on FOLLOW-UP visit 1 0 ASSESSMENTS - Nursing Assessment / Reassessment X - Reassessment of Co-morbidities (includes updates in patient status) 1 10 X- 1 5 Reassessment of Adherence to Treatment Plan ASSESSMENTS - Wound and Skin Assessment /  Reassessment X - Simple Wound Assessment / Reassessment - one wound 1 5 '[]'  - 0 Complex Wound Assessment / Reassessment - multiple wounds '[]'  - 0 Dermatologic / Skin Assessment (not related to wound area) ASSESSMENTS - Focused Assessment '[]'  - Circumferential Edema Measurements - multi extremities 0 '[]'  - 0 Nutritional Assessment / Counseling / Intervention '[]'  - 0 Lower Extremity Assessment (monofilament, tuning fork, pulses) '[]'  - 0 Peripheral Arterial Disease Assessment (using hand held doppler) ASSESSMENTS - Ostomy and/or Continence Assessment and Care '[]'  - Incontinence Assessment and Management 0 '[]'  - 0 Ostomy Care Assessment and Management (repouching, etc.) PROCESS - Coordination of Care X - Simple Patient / Family Education for ongoing care 1 15 '[]'  - 0 Complex (extensive) Patient / Family Education for ongoing care '[]'  - 0 Staff obtains Programmer, systems, Records, Test Results / Process Orders '[]'  - 0 Staff telephones HHA, Nursing Homes / Clarify orders / etc '[]'  - 0 Routine Transfer to another Facility (non-emergent condition) '[]'  - 0 Routine Hospital Admission (non-emergent condition) '[]'  - 0 New Admissions / Biomedical engineer / Ordering NPWT, Apligraf, etc. '[]'  - 0 Emergency Hospital Admission (emergent condition) X- 1 10 Simple Discharge Coordination '[]'  - 0 Complex (extensive) Discharge Coordination PROCESS - Special Needs '[]'  - Pediatric / Minor Patient Management 0 '[]'  - 0 Isolation Patient Management '[]'  - 0 Hearing / Language / Visual special needs '[]'  - 0 Assessment of Community assistance (transportation, D/C planning, etc.) '[]'  - 0 Additional assistance / Altered mentation '[]'  - 0 Support Surface(s) Assessment (bed, cushion, seat, etc.) INTERVENTIONS - Wound Cleansing / Measurement Whiteley, Trayvond ([id number]) X- 1 5 Simple Wound Cleansing - one wound '[]'  - 0 Complex Wound Cleansing - multiple wounds X- 1 5 Wound Imaging (photographs - any number of wounds) '[]'  -  0 Wound Tracing (instead of photographs) X- 1 5 Simple Wound Measurement - one wound '[]'  - 0 Complex Wound Measurement - multiple wounds INTERVENTIONS - Wound Dressings  X - Small Wound Dressing one or multiple wounds 1 10 '[]'  - 0 Medium Wound Dressing one or multiple wounds '[]'  - 0 Large Wound Dressing one or multiple wounds X- 1 5 Application of Medications - topical '[]'  - 0 Application of Medications - injection INTERVENTIONS - Miscellaneous '[]'  - External ear exam 0 '[]'  - 0 Specimen Collection (cultures, biopsies, blood, body fluids, etc.) '[]'  - 0 Specimen(s) / Culture(s) sent or taken to Lab for analysis '[]'  - 0 Patient Transfer (multiple staff / Civil Service fast streamer / Similar devices) '[]'  - 0 Simple Staple / Suture removal (25 or less) '[]'  - 0 Complex Staple / Suture removal (26 or more) '[]'  - 0 Hypo / Hyperglycemic Management (close monitor of Blood Glucose) '[]'  - 0 Ankle / Brachial Index (ABI) - do not check if billed separately X- 1 5 Vital Signs Has the patient been seen at the hospital within the last three years: Yes Total Score: 80 Level Of Care: New/Established - Level 3 Electronic Signature(s) Signed: 10/19/2020 4:27:39 PM By: Carlene Coria RN Entered By: Carlene Coria on 10/18/2020 10:53:10 [name] ([id number]) -------------------------------------------------------------------------------- Encounter Discharge Information Details Patient Name: [name] Date of Service: 10/18/2020 10:00 AM Medical Record Number: [id number] Patient Account Number: [id number] Date of Birth/Sex: 11-01-1945 (75 y.o. M) Treating RN: Dolan Amen Primary Care Laberta Wilbon: Otilio Miu Other Clinician: Referring Ruqayyah Lute: Otilio Miu Treating Ladd Cen/Extender: Skipper Cliche in Treatment: 7 Encounter Discharge Information Items Discharge Condition: Stable Ambulatory Status: Cane Discharge Destination: Home Transportation: Private Auto Accompanied By: self Schedule Follow-up  Appointment: No Clinical Summary of Care: Electronic Signature(s) Signed: 10/18/2020 4:55:56 PM By: Georges Mouse, Minus Breeding RN Entered By: Georges Mouse, Minus Breeding on 10/18/2020 11:41:28 [name] ([id number]) -------------------------------------------------------------------------------- Lower Extremity Assessment Details Patient Name: [name] Date of Service: 10/18/2020 10:00 AM Medical Record Number: [id number] Patient Account Number: [id number] Date of Birth/Sex: 10-25-1945 (74 y.o. M) Treating RN: Dolan Amen Primary Care Shaely Gadberry: Otilio Miu Other Clinician: Referring Treyshawn Muldrew: Otilio Miu Treating Johaan Ryser/Extender: Jeri Cos Weeks in Treatment: 7 Edema Assessment Assessed: [Left: Yes] [Right: No] Edema: [Left: N] [Right: o] Calf Left: Right: Point of Measurement: 33 cm From Medial Instep 37.5 cm Ankle Left: Right: Point of Measurement: 10 cm From Medial Instep 23.2 cm Vascular Assessment Pulses: Dorsalis Pedis Palpable: [Left:Yes] Electronic Signature(s) Signed: 10/18/2020 4:55:56 PM By: Georges Mouse, Minus Breeding RN Entered By: Georges Mouse, Minus Breeding on 10/18/2020 10:12:01 [name] ([id number]) -------------------------------------------------------------------------------- Multi Wound Chart Details Patient Name: [name] Date of Service: 10/18/2020 10:00 AM Medical Record Number: [id number] Patient Account Number: [id number] Date of Birth/Sex: Dec 23, 1945 (75 y.o. M) Treating RN: Carlene Coria Primary Care Amada Hallisey: Otilio Miu Other Clinician: Referring Valton Schwartz: Otilio Miu Treating Kathryne Ramella/Extender: Skipper Cliche in Treatment: 7 Vital Signs Height(in): 71 Pulse(bpm): 54 Weight(lbs): 205 Blood Pressure(mmHg): 131/75 Body Mass Index(BMI): 29 Temperature(F): 98.3 Respiratory Rate(breaths/min): 18 Photos: [N/A:N/A] Wound Location: Left, Lateral Lower Leg N/A N/A Wounding Event: Skin Tear/Laceration N/A N/A Primary  Etiology: Trauma, Other N/A N/A Comorbid History: Human Immunodeficiency Virus, N/A N/A Hypertension Date Acquired: 07/30/2020 N/A N/A Weeks of Treatment: 7 N/A N/A Wound Status: Open N/A N/A Measurements L x W x D (cm) 1.5x1.6x0.1 N/A N/A Area (cm) : 1.885 N/A N/A Volume (cm) : 0.188 N/A N/A % Reduction in Area: 76.60% N/A N/A % Reduction in Volume: 76.60% N/A N/A Classification: Full Thickness Without Exposed N/A N/A Support Structures Exudate Amount: Medium N/A N/A Exudate Type: Sanguinous N/A N/A Exudate Color: red N/A N/A Wound Margin: Flat and Intact N/A  N/A Granulation Amount: Large (67-100%) N/A N/A Granulation Quality: Red N/A N/A Necrotic Amount: None Present (0%) N/A N/A Exposed Structures: Fat Layer (Subcutaneous Tissue): N/A N/A Yes Fascia: No Tendon: No Muscle: No Joint: No Bone: No Epithelialization: Large (67-100%) N/A N/A Treatment Notes Electronic Signature(s) Signed: 10/19/2020 4:27:39 PM By: Carlene Coria RN Entered By: Carlene Coria on 10/18/2020 10:52:20 [name] ([id number]) -------------------------------------------------------------------------------- Multi-Disciplinary Care Plan Details Patient Name: [name] Date of Service: 10/18/2020 10:00 AM Medical Record Number: [id number] Patient Account Number: [id number] Date of Birth/Sex: September 27, 1945 (75 y.o. 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 Compression as ordered Provide education on venous insufficiency Notes: Electronic Signature(s) Signed: 10/19/2020 4:27:39 PM By: Carlene Coria RN Entered By: Carlene Coria on 10/18/2020 10:52:13 [name] ([id number]) -------------------------------------------------------------------------------- Pain Assessment Details Patient Name: [name] Date of Service: 10/18/2020 10:00 AM Medical Record Number: [id number] Patient Account Number: [id number] Date of Birth/Sex: Sep 06, 1945 (74 y.o. M) Treating RN: Dolan Amen Primary Care Jaidon Ellery: Otilio Miu Other Clinician: Referring Coltin Casher: Otilio Miu Treating Annesha Delgreco/Extender: Skipper Cliche in Treatment: 7 Active Problems Location of Pain Severity and Description of Pain Patient Has Paino No Site Locations Rate the pain. Current Pain Level: 0 Pain Management and Medication Current Pain Management: Electronic Signature(s) Signed: 10/18/2020 4:55:56 PM By: Georges Mouse, Minus Breeding RN Entered By: Georges Mouse, Minus Breeding on 10/18/2020 10:06:01 [name] ([id number]) -------------------------------------------------------------------------------- Patient/Caregiver Education Details Patient Name: [name] Date of Service: 10/18/2020 10:00 AM Medical Record Number: [id number] Patient Account Number: [id number] Date of Birth/Gender: 10/14/1945 (75 y.o. M) Treating RN: Carlene Coria Primary Care Physician: Otilio Miu Other Clinician: Referring Physician: Otilio Miu Treating Physician/Extender: Skipper Cliche in Treatment: 7 Education Assessment Education Provided To: Patient Education Topics Provided Venous: Methods: Explain/Verbal Responses: State content correctly Electronic Signature(s) Signed: 10/19/2020 4:27:39 PM By: Carlene Coria RN Entered By: Carlene Coria on 10/18/2020 10:53:24 [name]  ([id number]) -------------------------------------------------------------------------------- Wound Assessment Details Patient Name: [name] Date of Service: 10/18/2020 10:00 AM Medical Record Number: [id number] Patient Account Number: [id number] Date of Birth/Sex: 1946/07/20 (75 y.o. M) Treating RN: Dolan Amen Primary Care Avelyn Touch: Otilio Miu Other Clinician: Referring Okley Magnussen: Otilio Miu Treating Margel Joens/Extender: Jeri Cos Weeks in Treatment: 7 Wound Status Wound Number: 1 Primary Etiology: Trauma, Other Wound Location: Left, Lateral Lower Leg Wound Status: Open Wounding Event: Skin Tear/Laceration Comorbid History: Human Immunodeficiency Virus, Hypertension Date Acquired: 07/30/2020 Weeks Of Treatment: 7 Clustered Wound: No Photos Wound Measurements Length: (cm) 1.5 Width: (cm) 1.6 Depth: (cm) 0.1 Area: (cm) 1.885 Volume: (cm) 0.188 % Reduction in Area: 76.6% % Reduction in Volume: 76.6% Epithelialization: Large (67-100%) Tunneling: No Undermining: No Wound Description Classification: Full Thickness Without Exposed Support Structures Wound Margin: Flat and Intact Exudate Amount: Medium Exudate Type: Sanguinous Exudate Color: red Foul Odor After Cleansing: No Slough/Fibrino No Wound Bed Granulation Amount: Large (67-100%) Exposed Structure Granulation Quality: Red Fascia Exposed: No Necrotic Amount: None Present (0%) Fat Layer (Subcutaneous Tissue) Exposed: Yes Tendon Exposed: No Muscle Exposed: No Joint Exposed: No Bone Exposed: No Treatment Notes Wound #1 (Lower Leg)  Wound Laterality: Left, Lateral Cleanser Normal Saline Discharge Instruction: Wash your hands with soap and water. Remove old dressing, discard into plastic bag and place into trash. Cleanse the wound with Normal Saline prior to applying a clean dressing using gauze sponges, not tissues or cotton balls. Do not scrub or use excessive force. Pat dry using gauze sponges,  not tissue or cotton balls. BISHOY, CUPP ([id number]) Peri-Wound Care Desitin Maximum Strength Ointment 4 (oz) Topical Primary Dressing Hydrofera Blue Ready Transfer Foam, 2.5x2.5 (in/in) Discharge Instruction: Apply Hydrofera Blue Ready to wound bed as directed Secondary Dressing ABD Pad 5x9 (in/in) Discharge Instruction: Cover with ABD pad [place] Discharge Instruction: [place] as directed Secured With 61M [place] Surgical Tape, 2x2 (in/yd) Tubigrip Size G, 4.5x10 (in/yd) Discharge Instruction: Apply Tubigrip G 3-finger-widths below knee to base of toes to secure dressing and/or for swelling. Compression Wrap Compression Stockings Add-Ons Electronic Signature(s) Signed: 10/18/2020 4:55:56 PM By: Georges Mouse, Minus Breeding RN Entered By: Georges Mouse, Minus Breeding on 10/18/2020 10:09:09 [name] ([id number]) -------------------------------------------------------------------------------- Vitals Details Patient Name: [name] Date of Service: 10/18/2020 10:00 AM Medical Record Number: [id number] Patient Account Number: [id number] Date of Birth/Sex: October 14, 1945 (75 y.o. M) Treating RN: Dolan Amen Primary Care Lanier Millon: Otilio Miu Other Clinician: Referring Tillie Viverette: Otilio Miu Treating Mecca Barga/Extender: Jeri Cos Weeks in Treatment: 7 Vital Signs Time Taken: 10:02 Temperature (F): 98.3 Height (in): 71 Pulse (bpm): 69 Weight (lbs): 205 Respiratory Rate (breaths/min): 18 Body Mass Index (BMI): 28.6 Blood Pressure (mmHg): 131/75 Reference Range: 80 - 120 mg / dl Electronic Signature(s) Signed: 10/18/2020 4:55:56 PM By: Georges Mouse, Minus Breeding RN Entered By: Georges Mouse, Minus Breeding on 10/18/2020 10:04:12

## 2020-10-25 ENCOUNTER — Other Ambulatory Visit: Payer: Self-pay

## 2020-10-25 ENCOUNTER — Encounter: Payer: Medicare Other | Admitting: Physician Assistant

## 2020-10-25 DIAGNOSIS — R739 Hyperglycemia, unspecified: Secondary | ICD-10-CM | POA: Diagnosis not present

## 2020-10-25 DIAGNOSIS — I872 Venous insufficiency (chronic) (peripheral): Secondary | ICD-10-CM | POA: Diagnosis not present

## 2020-10-25 DIAGNOSIS — L97822 Non-pressure chronic ulcer of other part of left lower leg with fat layer exposed: Secondary | ICD-10-CM | POA: Diagnosis not present

## 2020-10-25 DIAGNOSIS — I10 Essential (primary) hypertension: Secondary | ICD-10-CM | POA: Diagnosis not present

## 2020-10-25 NOTE — Progress Notes (Addendum)
WANG, GRANADA ([id number]) Visit Report for 10/25/2020 Chief Complaint Document Details Patient Name: [name], [name] Date of Service: 10/25/2020 10:00 AM Medical Record Number: [id number] Patient Account Number: [id number] Date of Birth/Sex: Jan 30, 1946 (75 y.o. M) Treating RN: Yevonne Pax Primary Care Provider: Elizabeth Sauer Other Clinician: Lolita Cram Referring Provider: Elizabeth Sauer Treating Provider/Extender: Rowan Blase in Treatment: 8 Information Obtained from: Patient Chief Complaint Left LE Ulcer Electronic Signature(s) Signed: 10/25/2020 10:31:31 AM By: Lenda Kelp PA-C Entered By: Lenda Kelp on 10/25/2020 10:31:31 [name] ([id number]) -------------------------------------------------------------------------------- HPI Details Patient Name: [name] Date of Service: 10/25/2020 10:00 AM Medical Record Number: [id number] Patient Account Number: [id number] Date of Birth/Sex: Dec 29, 1945 (74 y.o. M) Treating RN: Yevonne Pax Primary Care Provider: Elizabeth Sauer Other Clinician: Lolita Cram Referring Provider: Elizabeth Sauer Treating Provider/Extender: Rowan Blase in Treatment: 8 History of Present Illness HPI Description: 08/30/2020 upon evaluation today patient presents today for inspection regarding a wound on his left lower extremity. He does have evidence of some chronic venous insufficiency and hemosiderin staining. With that being said he tells me this began after his stroke though he does not appear to be too swollen today this is good news. Fortunately there is no evidence of active infection at this time. No fevers, chills, nausea, vomiting, or diarrhea. He does have a history of hypertension and occasional elevated blood sugar levels although he does not have a formal diagnosis of diabetes according to what he tells me today. 09/06/2020 upon evaluation today patient appears to be doing well with regard to his leg ulcer. I do feel  like the Tubigrip has helped and I do feel like as well but the patient seems to be making good progress all things considered. In the past week this has measured better and seems to be healing quite well that is excellent news. Overall I am extremely pleased at this point. 09/13/2020 upon evaluation today patient appears to be doing well with regard to his wound. He has been tolerating the dressing changes with Hydrofera Blue without complication the wound bed appears to be doing significantly better which is great news. Overall I'm extremely pleased with where things stand. I do believe he still has a little bit of time to get this to heal just due to the overall size of the wound but nonetheless I think we are headed in the appropriate direction making good progress based on what I'm seeing. 09/20/2020 upon evaluation today patient appears to be doing well with regard to his leg ulcer. He has been tolerating the dressing changes without complication. Fortunately there is no signs of active infection at this time. No fevers, chills, nausea, vomiting, or diarrhea. 09/27/2020 upon evaluation today patient appears to be doing well with regard to his wound. He has been tolerating the dressing changes without complication. Fortunately there is no signs of active infection which is great news. No fevers, chills, nausea, vomiting, or diarrhea. 10/04/2020 upon evaluation today patient appears to be doing well with regard to his wound. This is showing signs of improvement. The overall measurement is better and I am very pleased in that regard. With that being said were still having issues with the Tubigrip and getting the right sides for him. I am really not certain what exactly is going on here but again what is being sent does not seem to be appropriate for what needs to fit him. 10/11/2020 upon evaluation today patient appears to be doing more poorly with some irritation around the  wound itself. The wound is  doing better but again the skin irritation has not. Fortunately there does not appear to be any signs of active infection at this time. No fevers, chills, nausea, vomiting, or diarrhea. 10/18/2020 upon evaluation today patient appears to be doing well with regard to his wound. Is been tolerating the dressing changes without complication. Fortunately there is no signs of active infection I do feel like putting the zinc on this area has done well for protecting over the past week. I am very pleased in that regard. 10/25/2020 upon evaluation today patient appears to be doing well with regard to his leg ulcer. He seems to be showing signs of improvement which is great news. Unfortunately the main issue that I see here is that he is having a big problem with what appears to be allergic reaction around the edges of the wound. That skin to need to be addressed currently. Electronic Signature(s) Signed: 10/25/2020 12:47:54 PM By: Lenda Kelp PA-C Entered By: Lenda Kelp on 10/25/2020 12:47:54 [name] ([id number]) -------------------------------------------------------------------------------- Physical Exam Details Patient Name: [name] Date of Service: 10/25/2020 10:00 AM Medical Record Number: [id number] Patient Account Number: [id number] Date of Birth/Sex: [birth year] (74 y.o. M) Treating RN: Yevonne Pax Primary Care Provider: Elizabeth Sauer Other Clinician: Lolita Cram Referring Provider: Elizabeth Sauer Treating Provider/Extender: Allen Derry Weeks in Treatment: 8 Constitutional Obese and well-hydrated in no acute distress. Respiratory normal breathing without difficulty. Psychiatric this patient is able to make decisions and demonstrates good insight into disease process. Alert and Oriented x 3. pleasant and cooperative. Notes Upon inspection patient's wound bed actually showed signs of good granulation epithelization. There was evidence however of an allergic  reaction which was almost wheal-and-flare type in nature. Therefore I discussed with the patient that we may need to go ahead and switch out and away from the zinc, and actually use triamcinolone to the periwound to help with this irritation I think that we will actually get things moving in a much better direction. Electronic Signature(s) Signed: 10/25/2020 12:48:24 PM By: Lenda Kelp PA-C Entered By: Lenda Kelp on 10/25/2020 12:48:23 [name] ([id number]) -------------------------------------------------------------------------------- Physician Orders Details Patient Name: [name] Date of Service: 10/25/2020 10:00 AM Medical Record Number: [id number] Patient Account Number: [id number] Date of Birth/Sex: Feb 09, 1946 (74 y.o. M) Treating RN: Yevonne Pax Primary Care Provider: Elizabeth Sauer Other Clinician: Lolita Cram Referring Provider: Elizabeth Sauer Treating Provider/Extender: Rowan Blase in Treatment: 8 Verbal / Phone Orders: No Diagnosis Coding ICD-10 Coding Code Description I87.2 Venous insufficiency (chronic) (peripheral) L97.822 Non-pressure chronic ulcer of other part of left lower leg with fat layer exposed I10 Essential (primary) hypertension R73.9 Hyperglycemia, unspecified Follow-up Appointments Wound #1 Left,Lateral Lower Leg o Return Appointment in 1 week. Anesthetic (Use 'Patient Medications' Section for Anesthetic Order Entry) Wound #1 Left,Lateral Lower Leg o Lidocaine applied to wound bed Wound Treatment Wound #1 - Lower Leg Wound Laterality: Left, Lateral Cleanser: Normal Saline 3 x Per Week/30 Days Discharge Instructions: Wash your hands with soap and water. Remove old dressing, discard into plastic bag and place into trash. Cleanse the wound with Normal Saline prior to applying a clean dressing using gauze sponges, not tissues or cotton balls. Do not scrub or use excessive force. Pat dry using gauze sponges, not tissue or  cotton balls. Peri-Wound Care: Triamcinolone Acetonide Cream, 0.1%, 15 (g) tube (Generic) 3 x Per Week/30 Days Discharge Instructions: apply to peri wound Primary Dressing: Hydrofera Blue Ready Transfer Foam, 2.5x2.5 (  in/in) (Generic) 3 x Per Week/30 Days Discharge Instructions: Apply Hydrofera Blue Ready to wound bed, cut to wound size Primary Dressing: stretch net (Generic) 3 x Per Week/30 Days Discharge Instructions: apply over kerlix Secondary Dressing: ABD Pad 5x9 (in/in) (Generic) 3 x Per Week/30 Days Discharge Instructions: Cover with ABD pad Secondary Dressing: Conforming Guaze Roll-Large (Generic) 3 x Per Week/30 Days Discharge Instructions: Apply Conforming Stretch Guaze Bandage as directed Secured With: 60M Medipore H Soft Cloth Surgical Tape, 2x2 (in/yd) (Generic) 3 x Per Week/30 Days Patient Medications Allergies: latex Notifications Medication Indication Start End triamcinolone acetonide 10/25/2020 DOSE topical 0.1 % ointment - ointment topical applied in a thin film around the irritated edges of the wound with each dressing change 4 times per week for 30 days [name], [name] ([id number]) Electronic Signature(s) Signed: 10/25/2020 12:49:55 PM By: Lenda Kelp PA-C Entered By: Lenda Kelp on 10/25/2020 12:49:54 [name] ([id number]) -------------------------------------------------------------------------------- Problem List Details Patient Name: [name] Date of Service: 10/25/2020 10:00 AM Medical Record Number: [id number] Patient Account Number: [id number] Date of Birth/Sex: [birth year] (74 y.o. M) Treating RN: Yevonne Pax Primary Care Provider: Elizabeth Sauer Other Clinician: Lolita Cram Referring Provider: Elizabeth Sauer Treating Provider/Extender: Rowan Blase in Treatment: 8 Active Problems ICD-10 Encounter Code Description Active Date MDM Diagnosis I87.2 Venous insufficiency (chronic) (peripheral) 08/30/2020 No Yes L97.822 Non-pressure  chronic ulcer of other part of left lower leg with fat layer 08/30/2020 No Yes exposed I10 Essential (primary) hypertension 08/30/2020 No Yes R73.9 Hyperglycemia, unspecified 08/30/2020 No Yes Inactive Problems Resolved Problems Electronic Signature(s) Signed: 10/25/2020 10:31:25 AM By: Lenda Kelp PA-C Entered By: Lenda Kelp on 10/25/2020 10:31:25 [name] ([id number]) -------------------------------------------------------------------------------- Progress Note Details Patient Name: [name] Date of Service: 10/25/2020 10:00 AM Medical Record Number: [id number] Patient Account Number: [id number] Date of Birth/Sex: [birth year] (74 y.o. M) Treating RN: Yevonne Pax Primary Care Provider: Elizabeth Sauer Other Clinician: Lolita Cram Referring Provider: Elizabeth Sauer Treating Provider/Extender: Rowan Blase in Treatment: 8 Subjective Chief Complaint Information obtained from Patient Left LE Ulcer History of Present Illness (HPI) 08/30/2020 upon evaluation today patient presents today for inspection regarding a wound on his left lower extremity. He does have evidence of some chronic venous insufficiency and hemosiderin staining. With that being said he tells me this began after his stroke though he does not appear to be too swollen today this is good news. Fortunately there is no evidence of active infection at this time. No fevers, chills, nausea, vomiting, or diarrhea. He does have a history of hypertension and occasional elevated blood sugar levels although he does not have a formal diagnosis of diabetes according to what he tells me today. 09/06/2020 upon evaluation today patient appears to be doing well with regard to his leg ulcer. I do feel like the Tubigrip has helped and I do feel like as well but the patient seems to be making good progress all things considered. In the past week this has measured better and seems to be healing quite well that is excellent  news. Overall I am extremely pleased at this point. 09/13/2020 upon evaluation today patient appears to be doing well with regard to his wound. He has been tolerating the dressing changes with Hydrofera Blue without complication the wound bed appears to be doing significantly better which is great news. Overall I'm extremely pleased with where things stand. I do believe he still has a little bit of time to get this to heal just due to the overall  size of the wound but nonetheless I think we are headed in the appropriate direction making good progress based on what I'm seeing. 09/20/2020 upon evaluation today patient appears to be doing well with regard to his leg ulcer. He has been tolerating the dressing changes without complication. Fortunately there is no signs of active infection at this time. No fevers, chills, nausea, vomiting, or diarrhea. 09/27/2020 upon evaluation today patient appears to be doing well with regard to his wound. He has been tolerating the dressing changes without complication. Fortunately there is no signs of active infection which is great news. No fevers, chills, nausea, vomiting, or diarrhea. 10/04/2020 upon evaluation today patient appears to be doing well with regard to his wound. This is showing signs of improvement. The overall measurement is better and I am very pleased in that regard. With that being said were still having issues with the Tubigrip and getting the right sides for him. I am really not certain what exactly is going on here but again what is being sent does not seem to be appropriate for what needs to fit him. 10/11/2020 upon evaluation today patient appears to be doing more poorly with some irritation around the wound itself. The wound is doing better but again the skin irritation has not. Fortunately there does not appear to be any signs of active infection at this time. No fevers, chills, nausea, vomiting, or diarrhea. 10/18/2020 upon evaluation today patient  appears to be doing well with regard to his wound. Is been tolerating the dressing changes without complication. Fortunately there is no signs of active infection I do feel like putting the zinc on this area has done well for protecting over the past week. I am very pleased in that regard. 10/25/2020 upon evaluation today patient appears to be doing well with regard to his leg ulcer. He seems to be showing signs of improvement which is great news. Unfortunately the main issue that I see here is that he is having a big problem with what appears to be allergic reaction around the edges of the wound. That skin to need to be addressed currently. Objective Constitutional Obese and well-hydrated in no acute distress. Vitals Time Taken: 10:13 AM, Height: 71 in, Weight: 205 lbs, BMI: 28.6, Temperature: 97.9 F, Pulse: 67 bpm, Respiratory Rate: 18 breaths/min, Blood Pressure: 137/77 mmHg. Respiratory normal breathing without difficulty. [name], [name] ([id number]) Psychiatric this patient is able to make decisions and demonstrates good insight into disease process. Alert and Oriented x 3. pleasant and cooperative. General Notes: Upon inspection patient's wound bed actually showed signs of good granulation epithelization. There was evidence however of an allergic reaction which was almost wheal-and-flare type in nature. Therefore I discussed with the patient that we may need to go ahead and switch out and away from the zinc, and actually use triamcinolone to the periwound to help with this irritation I think that we will actually get things moving in a much better direction. Integumentary (Hair, Skin) Wound #1 status is Open. Original cause of wound was Skin Tear/Laceration. The date acquired was: 07/30/2020. The wound has been in treatment 8 weeks. The wound is located on the Left,Lateral Lower Leg. The wound measures 1.5cm length x 1.7cm width x 0.1cm depth; 2.003cm^2 area and 0.2cm^3 volume. There is  Fat Layer (Subcutaneous Tissue) exposed. There is no tunneling or undermining noted. There is a medium amount of sanguinous drainage noted. The wound margin is flat and intact. There is large (67-100%) red granulation within the  wound bed. There is no necrotic tissue within the wound bed. Assessment Active Problems ICD-10 Venous insufficiency (chronic) (peripheral) Non-pressure chronic ulcer of other part of left lower leg with fat layer exposed Essential (primary) hypertension Hyperglycemia, unspecified Plan Follow-up Appointments: Wound #1 Left,Lateral Lower Leg: Return Appointment in 1 week. Anesthetic (Use 'Patient Medications' Section for Anesthetic Order Entry): Wound #1 Left,Lateral Lower Leg: Lidocaine applied to wound bed The following medication(s) was prescribed: triamcinolone acetonide topical 0.1 % ointment ointment topical applied in a thin film around the irritated edges of the wound with each dressing change 4 times per week for 30 days starting 10/25/2020 WOUND #1: - Lower Leg Wound Laterality: Left, Lateral Cleanser: Normal Saline 3 x Per Week/30 Days Discharge Instructions: Wash your hands with soap and water. Remove old dressing, discard into plastic bag and place into trash. Cleanse the wound with Normal Saline prior to applying a clean dressing using gauze sponges, not tissues or cotton balls. Do not scrub or use excessive force. Pat dry using gauze sponges, not tissue or cotton balls. Peri-Wound Care: Triamcinolone Acetonide Cream, 0.1%, 15 (g) tube (Generic) 3 x Per Week/30 Days Discharge Instructions: apply to peri wound Primary Dressing: Hydrofera Blue Ready Transfer Foam, 2.5x2.5 (in/in) (Generic) 3 x Per Week/30 Days Discharge Instructions: Apply Hydrofera Blue Ready to wound bed, cut to wound size Primary Dressing: stretch net (Generic) 3 x Per Week/30 Days Discharge Instructions: apply over kerlix Secondary Dressing: ABD Pad 5x9 (in/in) (Generic) 3 x Per  Week/30 Days Discharge Instructions: Cover with ABD pad Secondary Dressing: Conforming Guaze Roll-Large (Generic) 3 x Per Week/30 Days Discharge Instructions: Apply Conforming Stretch Guaze Bandage as directed Secured With: 59M Medipore H Soft Cloth Surgical Tape, 2x2 (in/yd) (Generic) 3 x Per Week/30 Days 1. Would recommend currently that we go ahead and continue with the wound care measures as before [facility] I think this is doing a great job. 2. We will however go ahead and initiate treatment with triamcinolone around the periwound region that was should hopefully help with the what appears to be allergic reaction as well. The patient is in agreement with that plan. 3. I am also can recommend that the patient continue to elevate his legs much as possible he really does not want to utilize the Tubigrip any longer even the larger size that we ordered though I think it fits him he still says it is too tight. He tells me that he felt like his leg was going to sleep. We will see patient back for reevaluation in 1 week here in the clinic. If anything worsens or changes patient will contact our office for additional recommendations. Mark ParkinsKAZIMIR, [name] ([id number]) Electronic Signature(s) Signed: 10/25/2020 12:50:06 PM By: Lenda KelpStone III, Latarshia [place] PA-C Entered By: Lenda KelpStone III, Finnbar Cedillos on 10/25/2020 12:50:06 Mark ParkinsKAZIMIR, [name] ([id number]) -------------------------------------------------------------------------------- SuperBill Details Patient Name: Mark ParkinsKAZIMIR, [name] Date of Service: 10/25/2020 Medical Record Number: [id number] Patient Account Number: [id number] Date of Birth/Sex: 09/05/1945 (74 y.o. M) Treating RN: Yevonne PaxEpps, Carrie Primary Care Provider: Elizabeth SauerJones, Deanna Other Clinician: Lolita CramBurnette, Kyara Referring Provider: Elizabeth SauerJones, Deanna Treating Provider/Extender: Rowan BlaseStone, Jaelyn Bourgoin Weeks in Treatment: 8 Diagnosis Coding ICD-10 Codes Code Description I87.2 Venous insufficiency (chronic)  (peripheral) L97.822 Non-pressure chronic ulcer of other part of left lower leg with fat layer exposed I10 Essential (primary) hypertension R73.9 Hyperglycemia, unspecified Facility Procedures CPT4 Code: [id number] Description: 99213 - WOUND CARE VISIT-LEV 3 EST PT Modifier: Quantity: 1 Physician Procedures CPT4 Code: [id number] Description: 99213 - WC PHYS LEVEL 3 -  EST PT Modifier: Quantity: 1 CPT4 Code: Description: ICD-10 Diagnosis Description I87.2 Venous insufficiency (chronic) (peripheral) L97.822 Non-pressure chronic ulcer of other part of left lower leg with fat lay I10 Essential (primary) hypertension R73.9 Hyperglycemia, unspecified Modifier: er exposed Quantity: Electronic Signature(s) Signed: 10/25/2020 12:52:48 PM By: Lenda Kelp PA-C Entered By: Lenda Kelp on 10/25/2020 12:52:48

## 2020-11-01 ENCOUNTER — Encounter: Payer: Medicare Other | Admitting: Physician Assistant

## 2020-11-01 ENCOUNTER — Other Ambulatory Visit: Payer: Self-pay

## 2020-11-01 DIAGNOSIS — S81812A Laceration without foreign body, left lower leg, initial encounter: Secondary | ICD-10-CM | POA: Diagnosis not present

## 2020-11-01 DIAGNOSIS — I872 Venous insufficiency (chronic) (peripheral): Secondary | ICD-10-CM | POA: Diagnosis not present

## 2020-11-01 DIAGNOSIS — I10 Essential (primary) hypertension: Secondary | ICD-10-CM | POA: Diagnosis not present

## 2020-11-01 DIAGNOSIS — R739 Hyperglycemia, unspecified: Secondary | ICD-10-CM | POA: Diagnosis not present

## 2020-11-01 DIAGNOSIS — L97822 Non-pressure chronic ulcer of other part of left lower leg with fat layer exposed: Secondary | ICD-10-CM | POA: Diagnosis not present

## 2020-11-01 NOTE — Progress Notes (Signed)
KAMARIAN, SAHAKIAN ([id number]) Visit Report for 11/01/2020 Arrival Information Details Patient Name: [name], [name] Date of Service: 11/01/2020 10:00 AM Medical Record Number: [id number] Patient Account Number: [id number] Date of Birth/Sex: 12-17-1945 (75 y.o. M) Treating RN: Carlene Coria Primary Care Audley Hinojos: Otilio Miu Other Clinician: Jeanine Luz Referring Jennifer Holland: Otilio Miu Treating Alyas Creary/Extender: Skipper Cliche in Treatment: 9 Visit Information History Since Last Visit Added or deleted any medications: No Patient Arrived: [name] Had a fall or experienced change in No Arrival Time: 09:52 activities of daily living that may affect Accompanied By: self risk of falls: Transfer Assistance: None Signs or symptoms of abuse/neglect since last visito No Patient Identification Verified: Yes Hospitalized since last visit: No Secondary Verification Process Completed: Yes Pain Present Now: No Patient Requires Transmission-Based Precautions: No Patient Has Alerts: No Electronic Signature(s) Signed: 11/01/2020 4:31:30 PM By: Jeanine Luz Entered By: Jeanine Luz on 11/01/2020 09:55:30 [name] ([id number]) -------------------------------------------------------------------------------- Clinic Level of Care Assessment Details Patient Name: [name] Date of Service: 11/01/2020 10:00 AM Medical Record Number: [id number] Patient Account Number: [id number] Date of Birth/Sex: 27-Oct-1945 (74 y.o. M) Treating RN: Carlene Coria Primary Care Nithin Demeo: Otilio Miu Other Clinician: Jeanine Luz Referring Ari Engelbrecht: Otilio Miu Treating Kenly Henckel/Extender: Skipper Cliche in Treatment: 9 Clinic Level of Care Assessment Items TOOL 4 Quantity Score X - Use when only an EandM is performed on FOLLOW-UP visit 1 0 ASSESSMENTS - Nursing Assessment / Reassessment X - Reassessment of Co-morbidities (includes updates in patient status) 1 10 X- 1 5 Reassessment of  Adherence to Treatment Plan ASSESSMENTS - Wound and Skin Assessment / Reassessment X - Simple Wound Assessment / Reassessment - one wound 1 5 '[]'  - 0 Complex Wound Assessment / Reassessment - multiple wounds '[]'  - 0 Dermatologic / Skin Assessment (not related to wound area) ASSESSMENTS - Focused Assessment '[]'  - Circumferential Edema Measurements - multi extremities 0 '[]'  - 0 Nutritional Assessment / Counseling / Intervention '[]'  - 0 Lower Extremity Assessment (monofilament, tuning fork, pulses) '[]'  - 0 Peripheral Arterial Disease Assessment (using hand held doppler) ASSESSMENTS - Ostomy and/or Continence Assessment and Care '[]'  - Incontinence Assessment and Management 0 '[]'  - 0 Ostomy Care Assessment and Management (repouching, etc.) PROCESS - Coordination of Care X - Simple Patient / Family Education for ongoing care 1 15 '[]'  - 0 Complex (extensive) Patient / Family Education for ongoing care '[]'  - 0 Staff obtains Programmer, systems, Records, Test Results / Process Orders '[]'  - 0 Staff telephones HHA, Nursing Homes / Clarify orders / etc '[]'  - 0 Routine Transfer to another Facility (non-emergent condition) '[]'  - 0 Routine Hospital Admission (non-emergent condition) '[]'  - 0 New Admissions / Biomedical engineer / Ordering NPWT, Apligraf, etc. '[]'  - 0 Emergency Hospital Admission (emergent condition) X- 1 10 Simple Discharge Coordination '[]'  - 0 Complex (extensive) Discharge Coordination PROCESS - Special Needs '[]'  - Pediatric / Minor Patient Management 0 '[]'  - 0 Isolation Patient Management '[]'  - 0 Hearing / Language / Visual special needs '[]'  - 0 Assessment of Community assistance (transportation, D/C planning, etc.) '[]'  - 0 Additional assistance / Altered mentation '[]'  - 0 Support Surface(s) Assessment (bed, cushion, seat, etc.) INTERVENTIONS - Wound Cleansing / Measurement Crance, Branden ([id number]) X- 1 5 Simple Wound Cleansing - one wound '[]'  - 0 Complex Wound Cleansing - multiple  wounds X- 1 5 Wound Imaging (photographs - any number of wounds) '[]'  - 0 Wound Tracing (instead of photographs) X- 1 5 Simple Wound Measurement - one wound '[]'  - 0 Complex  Wound Measurement - multiple wounds INTERVENTIONS - Wound Dressings X - Small Wound Dressing one or multiple wounds 1 10 '[]'  - 0 Medium Wound Dressing one or multiple wounds '[]'  - 0 Large Wound Dressing one or multiple wounds X- 1 5 Application of Medications - topical '[]'  - 0 Application of Medications - injection INTERVENTIONS - Miscellaneous '[]'  - External ear exam 0 '[]'  - 0 Specimen Collection (cultures, biopsies, blood, body fluids, etc.) '[]'  - 0 Specimen(s) / Culture(s) sent or taken to Lab for analysis '[]'  - 0 Patient Transfer (multiple staff / Civil Service fast streamer / Similar devices) '[]'  - 0 Simple Staple / Suture removal (25 or less) '[]'  - 0 Complex Staple / Suture removal (26 or more) '[]'  - 0 Hypo / Hyperglycemic Management (close monitor of Blood Glucose) '[]'  - 0 Ankle / Brachial Index (ABI) - do not check if billed separately X- 1 5 Vital Signs Has the patient been seen at the hospital within the last three years: Yes Total Score: 80 Level Of Care: New/Established - Level 3 Electronic Signature(s) Signed: 11/01/2020 5:08:01 PM By: Carlene Coria RN Entered By: Carlene Coria on 11/01/2020 10:13:17 [name] ([id number]) -------------------------------------------------------------------------------- Encounter Discharge Information Details Patient Name: [name] Date of Service: 11/01/2020 10:00 AM Medical Record Number: [id number] Patient Account Number: [id number] Date of Birth/Sex: 1945/10/07 (74 y.o. M) Treating RN: Dolan Amen Primary Care Oak Dorey: Otilio Miu Other Clinician: Jeanine Luz Referring Michaelann Gunnoe: Otilio Miu Treating Elicia Lui/Extender: Skipper Cliche in Treatment: 9 Encounter Discharge Information Items Discharge Condition: Stable Ambulatory Status: Cane Discharge  Destination: Home Transportation: Private Auto Accompanied By: self Schedule Follow-up Appointment: Yes Clinical Summary of Care: Electronic Signature(s) Signed: 11/01/2020 5:07:34 PM By: Georges Mouse, Minus Breeding RN Entered By: Georges Mouse, Minus Breeding on 11/01/2020 10:53:06 [name] ([id number]) -------------------------------------------------------------------------------- Lower Extremity Assessment Details Patient Name: [name] Date of Service: 11/01/2020 10:00 AM Medical Record Number: [id number] Patient Account Number: [id number] Date of Birth/Sex: Dec 08, 1945 (74 y.o. M) Treating RN: Carlene Coria Primary Care Christell Steinmiller: Otilio Miu Other Clinician: Jeanine Luz Referring Porshia Blizzard: Otilio Miu Treating Loisann Roach/Extender: Jeri Cos Weeks in Treatment: 9 Edema Assessment Assessed: [Left: Yes] [Right: No] Edema: [Left: N] [Right: o] Calf Left: Right: Point of Measurement: 33 cm From Medial Instep 38 cm Ankle Left: Right: Point of Measurement: 10 cm From Medial Instep 24 cm Vascular Assessment Pulses: Dorsalis Pedis Palpable: [Left:Yes] Electronic Signature(s) Signed: 11/01/2020 4:31:30 PM By: Jeanine Luz Signed: 11/01/2020 5:08:01 PM By: Carlene Coria RN Entered By: Jeanine Luz on 11/01/2020 10:02:48 [name] ([id number]) -------------------------------------------------------------------------------- Multi Wound Chart Details Patient Name: [name] Date of Service: 11/01/2020 10:00 AM Medical Record Number: [id number] Patient Account Number: [id number] Date of Birth/Sex: February 11, 1946 (74 y.o. M) Treating RN: Carlene Coria Primary Care Briana Newman: Otilio Miu Other Clinician: Jeanine Luz Referring Asiyah Pineau: Otilio Miu Treating Jerico Grisso/Extender: Skipper Cliche in Treatment: 9 Vital Signs Height(in): 71 Pulse(bpm): 64 Weight(lbs): 205 Blood Pressure(mmHg): 149/74 Body Mass Index(BMI): 29 Temperature(F): 97.5 Respiratory  Rate(breaths/min): 18 Photos: [N/A:N/A] Wound Location: Left, Lateral Lower Leg N/A N/A Wounding Event: Skin Tear/Laceration N/A N/A Primary Etiology: Trauma, Other N/A N/A Comorbid History: Human Immunodeficiency Virus, N/A N/A Hypertension Date Acquired: 07/30/2020 N/A N/A Weeks of Treatment: 9 N/A N/A Wound Status: Open N/A N/A Measurements L x W x D (cm) 0.6x1.1x0.1 N/A N/A Area (cm) : 0.518 N/A N/A Volume (cm) : 0.052 N/A N/A % Reduction in Area: 93.60% N/A N/A % Reduction in Volume: 93.50% N/A N/A Classification: Full Thickness Without Exposed N/A N/A Support Structures Exudate  Amount: Small N/A N/A Exudate Type: Sanguinous N/A N/A Exudate Color: red N/A N/A Wound Margin: Flat and Intact N/A N/A Granulation Amount: Large (67-100%) N/A N/A Granulation Quality: Red N/A N/A Necrotic Amount: None Present (0%) N/A N/A Exposed Structures: Fat Layer (Subcutaneous Tissue): N/A N/A Yes Fascia: No Tendon: No Muscle: No Joint: No Bone: No Epithelialization: Large (67-100%) N/A N/A Treatment Notes Electronic Signature(s) Signed: 11/01/2020 5:08:01 PM By: Carlene Coria RN Entered By: Carlene Coria on 11/01/2020 10:12:07 [name] ([id number]) -------------------------------------------------------------------------------- Multi-Disciplinary Care Plan Details Patient Name: [name] Date of Service: 11/01/2020 10:00 AM Medical Record Number: [id number] Patient Account Number: [id number] Date of Birth/Sex: [birth year] (75 y.o. 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 Compression as ordered Provide education on venous insufficiency Notes: Electronic Signature(s) Signed: 11/01/2020 5:08:01 PM By: Carlene Coria RN Entered By: Carlene Coria on 11/01/2020 10:11:47 [name] ([id number]) -------------------------------------------------------------------------------- Pain Assessment Details Patient Name: [name] Date of Service: 11/01/2020 10:00 AM Medical Record Number: [id number] Patient Account Number: [id number] Date of Birth/Sex: 1945-12-29 (74 y.o. M) Treating RN: Carlene Coria Primary Care Leauna Sharber: Otilio Miu Other Clinician: Jeanine Luz Referring Dakotah Orrego: Otilio Miu Treating Janee Ureste/Extender: Skipper Cliche in Treatment: 9 Active Problems Location of Pain Severity and Description of Pain Patient Has Paino No Site Locations Pain Management and Medication Current Pain Management: Electronic Signature(s) Signed: 11/01/2020 4:31:30 PM By: Jeanine Luz Signed: 11/01/2020 5:08:01 PM By: Carlene Coria RN Entered By: Jeanine Luz on 11/01/2020 09:56:19 [name] ([id number]) -------------------------------------------------------------------------------- Patient/Caregiver Education Details Patient Name: [name] Date of Service: 11/01/2020 10:00 AM Medical Record Number: [id number] Patient Account Number: [id number] Date of Birth/Gender: 1945-12-06 (75 y.o. M) Treating RN: Carlene Coria Primary Care Physician: Otilio Miu Other Clinician: Jeanine Luz Referring Physician: Otilio Miu Treating Physician/Extender: Skipper Cliche in Treatment: 9 Education Assessment Education Provided To: Patient Education Topics Provided Venous: Methods:  Explain/Verbal Responses: State content correctly Electronic Signature(s) Signed: 11/01/2020 5:08:01 PM By: Carlene Coria RN Entered By: Carlene Coria on 11/01/2020 10:13:35 [name] ([id number]) -------------------------------------------------------------------------------- Wound Assessment Details Patient Name: [name] Date of Service: 11/01/2020 10:00 AM Medical Record Number: [id number] Patient Account Number: [id number] Date of Birth/Sex: [birth year] (74 y.o. M) Treating RN: Carlene Coria Primary Care Craven Crean: Otilio Miu Other Clinician: Jeanine Luz Referring Yohance Hathorne: Otilio Miu Treating Pablo Stauffer/Extender: Jeri Cos Weeks in Treatment: 9 Wound Status Wound Number: 1 Primary Etiology: Trauma, Other Wound Location: Left, Lateral Lower Leg Wound Status: Open Wounding Event: Skin Tear/Laceration Comorbid History: Human Immunodeficiency Virus, Hypertension Date Acquired: 07/30/2020 Weeks Of Treatment: 9 Clustered Wound: No Photos Wound Measurements Length: (cm) 0.6 Width: (cm) 1.1 Depth: (cm) 0.1 Area: (cm) 0.518 Volume: (cm) 0.052 % Reduction in Area: 93.6% % Reduction in Volume: 93.5% Epithelialization: Large (67-100%) Tunneling: No Undermining: No Wound Description Classification: Full Thickness Without Exposed Support Structures Wound Margin: Flat and Intact Exudate Amount: Small Exudate Type: Sanguinous Exudate Color: red Foul Odor After Cleansing: No Slough/Fibrino No Wound Bed Granulation Amount: Large (67-100%) Exposed Structure Granulation Quality: Red Fascia Exposed: No Necrotic Amount: None  Present (0%) Fat Layer (Subcutaneous Tissue) Exposed: Yes Tendon Exposed: No Muscle Exposed: No Joint Exposed: No Bone Exposed: No Treatment Notes Wound #1 (Lower Leg) Wound Laterality: Left, Lateral Cleanser Normal Saline Discharge Instruction: Wash your hands with soap and water. Remove old dressing, discard into plastic bag and  place into trash. Cleanse the wound with Normal Saline prior to applying a clean dressing using gauze sponges, not tissues or cotton balls. Do not scrub or use excessive force. Pat dry using gauze sponges, not tissue or cotton balls. MATVEY, LLANAS ([id number]) Peri-Wound Care Triamcinolone Acetonide Cream, 0.1%, 15 (g) tube Discharge Instruction: apply to peri wound Topical Primary Dressing Hydrofera Blue Ready Transfer Foam, 2.5x2.5 (in/in) Discharge Instruction: Apply Hydrofera Blue Ready to wound bed, cut to wound size stretch net Discharge Instruction: apply over kerlix Secondary Dressing ABD Pad 5x9 (in/in) Discharge Instruction: Cover with ABD pad [place] Discharge Instruction: [place] as directed Secured With [place] Surgical Tape, 2x2 (in/yd) Compression Wrap Compression Stockings Add-Ons Electronic Signature(s) Signed: 11/01/2020 4:31:30 PM By: Jeanine Luz Signed: 11/01/2020 5:08:01 PM By: Carlene Coria RN Entered By: Jeanine Luz on 11/01/2020 10:01:21 [name] ([id number]) -------------------------------------------------------------------------------- [place] Details Patient Name: [name] Date of Service: 11/01/2020 10:00 AM Medical Record Number: [id number] Patient Account Number: [id number] Date of Birth/Sex: 03-06-1946 (75 y.o. M) Treating RN: Carlene Coria Primary Care Jaris Kohles: Otilio Miu Other Clinician: Jeanine Luz Referring Ryler Laskowski: Otilio Miu Treating Floriene Jeschke/Extender: Skipper Cliche in Treatment: 9 Vital Signs Time Taken: 09:50 Temperature (F): 97.5 Height (in): 71 Pulse (bpm): 66 Weight (lbs): 205 Respiratory Rate (breaths/min): 18 Body Mass Index (BMI): 28.6 Blood Pressure (mmHg): 149/74 Reference Range: 80 - 120 mg / dl Electronic Signature(s) Signed: 11/01/2020 4:31:30 PM By: Jeanine Luz Entered By: Jeanine Luz on 11/01/2020 09:56:14

## 2020-11-01 NOTE — Progress Notes (Addendum)
LEVITICUS, HARTON ([id number]) Visit Report for 11/01/2020 Chief Complaint Document Details Patient Name: [name], [name] Date of Service: 11/01/2020 10:00 AM Medical Record Number: [id number] Patient Account Number: [id number] Date of Birth/Sex: 1945/12/06 (75 y.o. M) Treating RN: Yevonne Pax Primary Care Provider: Elizabeth Sauer Other Clinician: Lolita Cram Referring Provider: Elizabeth Sauer Treating Provider/Extender: Rowan Blase in Treatment: 9 Information Obtained from: Patient Chief Complaint Left LE Ulcer Electronic Signature(s) Signed: 11/01/2020 9:58:59 AM By: Lenda Kelp PA-C Entered By: Lenda Kelp on 11/01/2020 09:58:59 [name] ([id number]) -------------------------------------------------------------------------------- HPI Details Patient Name: [name] Date of Service: 11/01/2020 10:00 AM Medical Record Number: [id number] Patient Account Number: [id number] Date of Birth/Sex: 1945-08-30 (74 y.o. M) Treating RN: Yevonne Pax Primary Care Provider: Elizabeth Sauer Other Clinician: Lolita Cram Referring Provider: Elizabeth Sauer Treating Provider/Extender: Rowan Blase in Treatment: 9 History of Present Illness HPI Description: 08/30/2020 upon evaluation today patient presents today for inspection regarding a wound on his left lower extremity. He does have evidence of some chronic venous insufficiency and hemosiderin staining. With that being said he tells me this began after his stroke though he does not appear to be too swollen today this is good news. Fortunately there is no evidence of active infection at this time. No fevers, chills, nausea, vomiting, or diarrhea. He does have a history of hypertension and occasional elevated blood sugar levels although he does not have a formal diagnosis of diabetes according to what he tells me today. 09/06/2020 upon evaluation today patient appears to be doing well with regard to his leg ulcer. I do feel  like the Tubigrip has helped and I do feel like as well but the patient seems to be making good progress all things considered. In the past week this has measured better and seems to be healing quite well that is excellent news. Overall I am extremely pleased at this point. 09/13/2020 upon evaluation today patient appears to be doing well with regard to his wound. He has been tolerating the dressing changes with Hydrofera Blue without complication the wound bed appears to be doing significantly better which is great news. Overall I'm extremely pleased with where things stand. I do believe he still has a little bit of time to get this to heal just due to the overall size of the wound but nonetheless I think we are headed in the appropriate direction making good progress based on what I'm seeing. 09/20/2020 upon evaluation today patient appears to be doing well with regard to his leg ulcer. He has been tolerating the dressing changes without complication. Fortunately there is no signs of active infection at this time. No fevers, chills, nausea, vomiting, or diarrhea. 09/27/2020 upon evaluation today patient appears to be doing well with regard to his wound. He has been tolerating the dressing changes without complication. Fortunately there is no signs of active infection which is great news. No fevers, chills, nausea, vomiting, or diarrhea. 10/04/2020 upon evaluation today patient appears to be doing well with regard to his wound. This is showing signs of improvement. The overall measurement is better and I am very pleased in that regard. With that being said were still having issues with the Tubigrip and getting the right sides for him. I am really not certain what exactly is going on here but again what is being sent does not seem to be appropriate for what needs to fit him. 10/11/2020 upon evaluation today patient appears to be doing more poorly with some irritation around the  wound itself. The wound is  doing better but again the skin irritation has not. Fortunately there does not appear to be any signs of active infection at this time. No fevers, chills, nausea, vomiting, or diarrhea. 10/18/2020 upon evaluation today patient appears to be doing well with regard to his wound. Is been tolerating the dressing changes without complication. Fortunately there is no signs of active infection I do feel like putting the zinc on this area has done well for protecting over the past week. I am very pleased in that regard. 10/25/2020 upon evaluation today patient appears to be doing well with regard to his leg ulcer. He seems to be showing signs of improvement which is great news. Unfortunately the main issue that I see here is that he is having a big problem with what appears to be allergic reaction around the edges of the wound. That skin to need to be addressed currently. 11/01/2020 upon evaluation today patient appears to be doing well with regard to his wound on the leg. He has been tolerating the dressing changes without complication and fortunately there is not appear to be any evidence of active infection which is great news. No fevers, chills, nausea, vomiting, or diarrhea. Electronic Signature(s) Signed: 11/01/2020 10:28:47 AM By: Lenda KelpStone III, Izela Altier PA-C Entered By: Lenda KelpStone III, Aiman Sonn on 11/01/2020 10:28:47 [name], [name] ([id number]) -------------------------------------------------------------------------------- Physical Exam Details Patient Name: [name], [name] Date of Service: 11/01/2020 10:00 AM Medical Record Number: [id number] Patient Account Number: [id number] Date of Birth/Sex: May 04, 1946 (74 y.o. M) Treating RN: Yevonne PaxEpps, Carrie Primary Care Provider: Elizabeth SauerJones, Deanna Other Clinician: Lolita CramBurnette, Kyara Referring Provider: Elizabeth SauerJones, Deanna Treating Provider/Extender: Rowan BlaseStone, Dalyce Renne Weeks in Treatment: 9 Constitutional Obese and well-hydrated in no acute distress. Respiratory normal breathing without  difficulty. Psychiatric this patient is able to make decisions and demonstrates good insight into disease process. Alert and Oriented x 3. pleasant and cooperative. Notes Patient's wound bed showed signs of good granulation epithelization at this point. There does not appear to be any signs of active infection at this time which is great news and overall I am extremely pleased with where things stand. No sharp debridement was necessary today. Electronic Signature(s) Signed: 11/01/2020 10:29:06 AM By: Lenda KelpStone III, Lendy Dittrich PA-C Entered By: Lenda KelpStone III, Torrance Frech on 11/01/2020 10:29:05 [name], [name] ([id number]) -------------------------------------------------------------------------------- Physician Orders Details Patient Name: [name], [name] Date of Service: 11/01/2020 10:00 AM Medical Record Number: [id number] Patient Account Number: [id number] Date of Birth/Sex: May 04, 1946 (74 y.o. M) Treating RN: Yevonne PaxEpps, Carrie Primary Care Provider: Elizabeth SauerJones, Deanna Other Clinician: Lolita CramBurnette, Kyara Referring Provider: Elizabeth SauerJones, Deanna Treating Provider/Extender: Rowan BlaseStone, Nathanyal Ashmead Weeks in Treatment: 9 Verbal / Phone Orders: No Diagnosis Coding ICD-10 Coding Code Description I87.2 Venous insufficiency (chronic) (peripheral) L97.822 Non-pressure chronic ulcer of other part of left lower leg with fat layer exposed I10 Essential (primary) hypertension R73.9 Hyperglycemia, unspecified Follow-up Appointments Wound #1 Left,Lateral Lower Leg o Return Appointment in 1 week. Anesthetic (Use 'Patient Medications' Section for Anesthetic Order Entry) Wound #1 Left,Lateral Lower Leg o Lidocaine applied to wound bed Wound Treatment Wound #1 - Lower Leg Wound Laterality: Left, Lateral Cleanser: Normal Saline 3 x Per Week/30 Days Discharge Instructions: Wash your hands with soap and water. Remove old dressing, discard into plastic bag and place into trash. Cleanse the wound with Normal Saline prior to applying a clean dressing  using gauze sponges, not tissues or cotton balls. Do not scrub or use excessive force. Pat dry using gauze sponges, not tissue or cotton balls. Peri-Wound Care: Triamcinolone Acetonide Cream, 0.1%,  15 (g) tube (Generic) 3 x Per Week/30 Days Discharge Instructions: apply to peri wound Primary Dressing: Hydrofera Blue Ready Transfer Foam, 2.5x2.5 (in/in) (Generic) 3 x Per Week/30 Days Discharge Instructions: Apply Hydrofera Blue Ready to wound bed, cut to wound size Primary Dressing: stretch net (Generic) 3 x Per Week/30 Days Discharge Instructions: apply over kerlix Secondary Dressing: ABD Pad 5x9 (in/in) (Generic) 3 x Per Week/30 Days Discharge Instructions: Cover with ABD pad Secondary Dressing: Conforming Guaze Roll-Large (Generic) 3 x Per Week/30 Days Discharge Instructions: Apply Conforming Stretch Guaze Bandage as directed Secured With: 76M Medipore H Soft Cloth Surgical Tape, 2x2 (in/yd) (Generic) 3 x Per Week/30 Days Electronic Signature(s) Signed: 11/01/2020 5:08:01 PM By: Yevonne Pax RN Signed: 11/01/2020 5:17:55 PM By: Lenda Kelp PA-C Entered By: Yevonne Pax on 11/01/2020 10:12:48 [name] ([id number]) -------------------------------------------------------------------------------- Problem List Details Patient Name: [name] Date of Service: 11/01/2020 10:00 AM Medical Record Number: [id number] Patient Account Number: [id number] Date of Birth/Sex: 03-01-1946 (75 y.o. M) Treating RN: Yevonne Pax Primary Care Provider: Elizabeth Sauer Other Clinician: Lolita Cram Referring Provider: Elizabeth Sauer Treating Provider/Extender: Rowan Blase in Treatment: 9 Active Problems ICD-10 Encounter Code Description Active Date MDM Diagnosis I87.2 Venous insufficiency (chronic) (peripheral) 08/30/2020 No Yes L97.822 Non-pressure chronic ulcer of other part of left lower leg with fat layer 08/30/2020 No Yes exposed I10 Essential (primary) hypertension 08/30/2020 No  Yes R73.9 Hyperglycemia, unspecified 08/30/2020 No Yes Inactive Problems Resolved Problems Electronic Signature(s) Signed: 11/01/2020 9:58:50 AM By: Lenda Kelp PA-C Entered By: Lenda Kelp on 11/01/2020 09:58:50 [name] ([id number]) -------------------------------------------------------------------------------- Progress Note Details Patient Name: [name] Date of Service: 11/01/2020 10:00 AM Medical Record Number: [id number] Patient Account Number: [id number] Date of Birth/Sex: 10-03-1945 (74 y.o. M) Treating RN: Yevonne Pax Primary Care Provider: Elizabeth Sauer Other Clinician: Lolita Cram Referring Provider: Elizabeth Sauer Treating Provider/Extender: Rowan Blase in Treatment: 9 Subjective Chief Complaint Information obtained from Patient Left LE Ulcer History of Present Illness (HPI) 08/30/2020 upon evaluation today patient presents today for inspection regarding a wound on his left lower extremity. He does have evidence of some chronic venous insufficiency and hemosiderin staining. With that being said he tells me this began after his stroke though he does not appear to be too swollen today this is good news. Fortunately there is no evidence of active infection at this time. No fevers, chills, nausea, vomiting, or diarrhea. He does have a history of hypertension and occasional elevated blood sugar levels although he does not have a formal diagnosis of diabetes according to what he tells me today. 09/06/2020 upon evaluation today patient appears to be doing well with regard to his leg ulcer. I do feel like the Tubigrip has helped and I do feel like as well but the patient seems to be making good progress all things considered. In the past week this has measured better and seems to be healing quite well that is excellent news. Overall I am extremely pleased at this point. 09/13/2020 upon evaluation today patient appears to be doing well with regard to his  wound. He has been tolerating the dressing changes with Hydrofera Blue without complication the wound bed appears to be doing significantly better which is great news. Overall I'm extremely pleased with where things stand. I do believe he still has a little bit of time to get this to heal just due to the overall size of the wound but nonetheless I think we are headed in the appropriate direction making  good progress based on what I'm seeing. 09/20/2020 upon evaluation today patient appears to be doing well with regard to his leg ulcer. He has been tolerating the dressing changes without complication. Fortunately there is no signs of active infection at this time. No fevers, chills, nausea, vomiting, or diarrhea. 09/27/2020 upon evaluation today patient appears to be doing well with regard to his wound. He has been tolerating the dressing changes without complication. Fortunately there is no signs of active infection which is great news. No fevers, chills, nausea, vomiting, or diarrhea. 10/04/2020 upon evaluation today patient appears to be doing well with regard to his wound. This is showing signs of improvement. The overall measurement is better and I am very pleased in that regard. With that being said were still having issues with the Tubigrip and getting the right sides for him. I am really not certain what exactly is going on here but again what is being sent does not seem to be appropriate for what needs to fit him. 10/11/2020 upon evaluation today patient appears to be doing more poorly with some irritation around the wound itself. The wound is doing better but again the skin irritation has not. Fortunately there does not appear to be any signs of active infection at this time. No fevers, chills, nausea, vomiting, or diarrhea. 10/18/2020 upon evaluation today patient appears to be doing well with regard to his wound. Is been tolerating the dressing changes without complication. Fortunately there is  no signs of active infection I do feel like putting the zinc on this area has done well for protecting over the past week. I am very pleased in that regard. 10/25/2020 upon evaluation today patient appears to be doing well with regard to his leg ulcer. He seems to be showing signs of improvement which is great news. Unfortunately the main issue that I see here is that he is having a big problem with what appears to be allergic reaction around the edges of the wound. That skin to need to be addressed currently. 11/01/2020 upon evaluation today patient appears to be doing well with regard to his wound on the leg. He has been tolerating the dressing changes without complication and fortunately there is not appear to be any evidence of active infection which is great news. No fevers, chills, nausea, vomiting, or diarrhea. Objective Constitutional Obese and well-hydrated in no acute distress. Vitals Time Taken: 9:50 AM, Height: 71 in, Weight: 205 lbs, BMI: 28.6, Temperature: 97.5 F, Pulse: 66 bpm, Respiratory Rate: 18 breaths/min, Blood Pressure: 149/74 mmHg. EQUAN, COGBILL ([id number]) Respiratory normal breathing without difficulty. Psychiatric this patient is able to make decisions and demonstrates good insight into disease process. Alert and Oriented x 3. pleasant and cooperative. General Notes: Patient's wound bed showed signs of good granulation epithelization at this point. There does not appear to be any signs of active infection at this time which is great news and overall I am extremely pleased with where things stand. No sharp debridement was necessary today. Integumentary (Hair, Skin) Wound #1 status is Open. Original cause of wound was Skin Tear/Laceration. The date acquired was: 07/30/2020. The wound has been in treatment 9 weeks. The wound is located on the Left,Lateral Lower Leg. The wound measures 0.6cm length x 1.1cm width x 0.1cm depth; 0.518cm^2 area and 0.052cm^3 volume. There  is Fat Layer (Subcutaneous Tissue) exposed. There is no tunneling or undermining noted. There is a small amount of sanguinous drainage noted. The wound margin is flat and intact. There  is large (67-100%) red granulation within the wound bed. There is no necrotic tissue within the wound bed. Assessment Active Problems ICD-10 Venous insufficiency (chronic) (peripheral) Non-pressure chronic ulcer of other part of left lower leg with fat layer exposed Essential (primary) hypertension Hyperglycemia, unspecified Plan Follow-up Appointments: Wound #1 Left,Lateral Lower Leg: Return Appointment in 1 week. Anesthetic (Use 'Patient Medications' Section for Anesthetic Order Entry): Wound #1 Left,Lateral Lower Leg: Lidocaine applied to wound bed WOUND #1: - Lower Leg Wound Laterality: Left, Lateral Cleanser: Normal Saline 3 x Per Week/30 Days Discharge Instructions: Wash your hands with soap and water. Remove old dressing, discard into plastic bag and place into trash. Cleanse the wound with Normal Saline prior to applying a clean dressing using gauze sponges, not tissues or cotton balls. Do not scrub or use excessive force. Pat dry using gauze sponges, not tissue or cotton balls. Peri-Wound Care: Triamcinolone Acetonide Cream, 0.1%, 15 (g) tube (Generic) 3 x Per Week/30 Days Discharge Instructions: apply to peri wound Primary Dressing: Hydrofera Blue Ready Transfer Foam, 2.5x2.5 (in/in) (Generic) 3 x Per Week/30 Days Discharge Instructions: Apply Hydrofera Blue Ready to wound bed, cut to wound size Primary Dressing: stretch net (Generic) 3 x Per Week/30 Days Discharge Instructions: apply over kerlix Secondary Dressing: ABD Pad 5x9 (in/in) (Generic) 3 x Per Week/30 Days Discharge Instructions: Cover with ABD pad Secondary Dressing: Conforming Guaze Roll-Large (Generic) 3 x Per Week/30 Days Discharge Instructions: Apply Conforming Stretch Guaze Bandage as directed Secured With: 72M Medipore H Soft  Cloth Surgical Tape, 2x2 (in/yd) (Generic) 3 x Per Week/30 Days 1. Would recommend currently that we going continue with the wound care measures as before and the patient is in agreement with the plan this includes the use of the [facility] dressing followed by the ABD pad. 2. I am also can recommend that we continue with the triamcinolone to the wound bed and surrounding region. 3. I am also going to suggest that we have the patient continue to wrap this with roll gauze and secure with stretch net that has done well.. We will see patient back for reevaluation in 1 week here in the clinic. If anything worsens or changes patient will contact our office for additional recommendations. Electronic Signature(s) LINDA, BIEHN ([id number]) Signed: 11/01/2020 10:30:59 AM By: Lenda Kelp PA-C Entered By: Lenda Kelp on 11/01/2020 10:30:59 [name] ([id number]) -------------------------------------------------------------------------------- SuperBill Details Patient Name: [name] Date of Service: 11/01/2020 Medical Record Number: [id number] Patient Account Number: [id number] Date of Birth/Sex: 10-31-1945 (74 y.o. M) Treating RN: Yevonne Pax Primary Care Provider: Elizabeth Sauer Other Clinician: Lolita Cram Referring Provider: Elizabeth Sauer Treating Provider/Extender: Rowan Blase in Treatment: 9 Diagnosis Coding ICD-10 Codes Code Description I87.2 Venous insufficiency (chronic) (peripheral) L97.822 Non-pressure chronic ulcer of other part of left lower leg with fat layer exposed I10 Essential (primary) hypertension R73.9 Hyperglycemia, unspecified Facility Procedures CPT4 Code: [id number] Description: 99213 - WOUND CARE VISIT-LEV 3 EST PT Modifier: Quantity: 1 Physician Procedures CPT4 Code: [id number] Description: 99213 - WC PHYS LEVEL 3 - EST PT Modifier: Quantity: 1 CPT4 Code: Description: ICD-10 Diagnosis Description I87.2 Venous insufficiency (chronic)  (peripheral) L97.822 Non-pressure chronic ulcer of other part of left lower leg with fat lay I10 Essential (primary) hypertension R73.9 Hyperglycemia, unspecified Modifier: er exposed Quantity: Electronic Signature(s) Signed: 11/01/2020 10:31:13 AM By: Lenda Kelp PA-C Entered By: Lenda Kelp on 11/01/2020 10:31:13

## 2020-11-03 ENCOUNTER — Telehealth: Payer: Self-pay

## 2020-11-03 ENCOUNTER — Other Ambulatory Visit: Payer: Medicare Other

## 2020-11-03 ENCOUNTER — Other Ambulatory Visit: Payer: Self-pay

## 2020-11-03 DIAGNOSIS — Z7901 Long term (current) use of anticoagulants: Secondary | ICD-10-CM | POA: Diagnosis not present

## 2020-11-03 NOTE — Telephone Encounter (Unsigned)
Copied from CRM [id number]. Topic: General - Other >> Nov 03, 2020  9:27 AM Gwenlyn Fudge wrote: Reason for CRM: Pt called and is requesting to have a coumadin blood test ordered. Pt states that he would like to have this done today after lunch if possible. Please advise.

## 2020-11-03 NOTE — Telephone Encounter (Signed)
Put on schedule and hit arrived

## 2020-11-04 LAB — PROTIME-INR
INR: 1.4 — ABNORMAL HIGH (ref 0.9–1.2)
Prothrombin Time: 14.4 s — ABNORMAL HIGH (ref 9.1–12.0)

## 2020-11-08 ENCOUNTER — Encounter: Payer: Medicare Other | Attending: Physician Assistant | Admitting: Physician Assistant

## 2020-11-08 ENCOUNTER — Other Ambulatory Visit: Payer: Self-pay

## 2020-11-08 DIAGNOSIS — I872 Venous insufficiency (chronic) (peripheral): Secondary | ICD-10-CM | POA: Insufficient documentation

## 2020-11-08 DIAGNOSIS — R739 Hyperglycemia, unspecified: Secondary | ICD-10-CM | POA: Insufficient documentation

## 2020-11-08 DIAGNOSIS — I10 Essential (primary) hypertension: Secondary | ICD-10-CM | POA: Insufficient documentation

## 2020-11-08 DIAGNOSIS — L97822 Non-pressure chronic ulcer of other part of left lower leg with fat layer exposed: Secondary | ICD-10-CM | POA: Insufficient documentation

## 2020-11-08 NOTE — Progress Notes (Addendum)
DMANI, MIZER ([id number]) Visit Report for 11/08/2020 Chief Complaint Document Details Patient Name: [name], [name] Date of Service: 11/08/2020 10:00 AM Medical Record Number: [id number] Patient Account Number: [id number] Date of Birth/Sex: 11-Jun-1946 (75 y.o. M) Treating RN: Yevonne Pax Primary Care Provider: Elizabeth Sauer Other Clinician: Lolita Cram Referring Provider: Elizabeth Sauer Treating Provider/Extender: Rowan Blase in Treatment: 10 Information Obtained from: Patient Chief Complaint Left LE Ulcer Electronic Signature(s) Signed: 11/08/2020 10:11:47 AM By: Lenda Kelp PA-C Entered By: Lenda Kelp on 11/08/2020 10:11:47 [name] ([id number]) -------------------------------------------------------------------------------- HPI Details Patient Name: [name] Date of Service: 11/08/2020 10:00 AM Medical Record Number: [id number] Patient Account Number: [id number] Date of Birth/Sex: Aug 14, 1945 (75 y.o. M) Treating RN: Yevonne Pax Primary Care Provider: Elizabeth Sauer Other Clinician: Lolita Cram Referring Provider: Elizabeth Sauer Treating Provider/Extender: Rowan Blase in Treatment: 10 History of Present Illness HPI Description: 08/30/2020 upon evaluation today patient presents today for inspection regarding a wound on his left lower extremity. He does have evidence of some chronic venous insufficiency and hemosiderin staining. With that being said he tells me this began after his stroke though he does not appear to be too swollen today this is good news. Fortunately there is no evidence of active infection at this time. No fevers, chills, nausea, vomiting, or diarrhea. He does have a history of hypertension and occasional elevated blood sugar levels although he does not have a formal diagnosis of diabetes according to what he tells me today. 09/06/2020 upon evaluation today patient appears to be doing well with regard to his leg ulcer. I do feel  like the Tubigrip has helped and I do feel like as well but the patient seems to be making good progress all things considered. In the past week this has measured better and seems to be healing quite well that is excellent news. Overall I am extremely pleased at this point. 09/13/2020 upon evaluation today patient appears to be doing well with regard to his wound. He has been tolerating the dressing changes with Hydrofera Blue without complication the wound bed appears to be doing significantly better which is great news. Overall I'm extremely pleased with where things stand. I do believe he still has a little bit of time to get this to heal just due to the overall size of the wound but nonetheless I think we are headed in the appropriate direction making good progress based on what I'm seeing. 09/20/2020 upon evaluation today patient appears to be doing well with regard to his leg ulcer. He has been tolerating the dressing changes without complication. Fortunately there is no signs of active infection at this time. No fevers, chills, nausea, vomiting, or diarrhea. 09/27/2020 upon evaluation today patient appears to be doing well with regard to his wound. He has been tolerating the dressing changes without complication. Fortunately there is no signs of active infection which is great news. No fevers, chills, nausea, vomiting, or diarrhea. 10/04/2020 upon evaluation today patient appears to be doing well with regard to his wound. This is showing signs of improvement. The overall measurement is better and I am very pleased in that regard. With that being said were still having issues with the Tubigrip and getting the right sides for him. I am really not certain what exactly is going on here but again what is being sent does not seem to be appropriate for what needs to fit him. 10/11/2020 upon evaluation today patient appears to be doing more poorly with some irritation around the  wound itself. The wound is  doing better but again the skin irritation has not. Fortunately there does not appear to be any signs of active infection at this time. No fevers, chills, nausea, vomiting, or diarrhea. 10/18/2020 upon evaluation today patient appears to be doing well with regard to his wound. Is been tolerating the dressing changes without complication. Fortunately there is no signs of active infection I do feel like putting the zinc on this area has done well for protecting over the past week. I am very pleased in that regard. 10/25/2020 upon evaluation today patient appears to be doing well with regard to his leg ulcer. He seems to be showing signs of improvement which is great news. Unfortunately the main issue that I see here is that he is having a big problem with what appears to be allergic reaction around the edges of the wound. That skin to need to be addressed currently. 11/01/2020 upon evaluation today patient appears to be doing well with regard to his wound on the leg. He has been tolerating the dressing changes without complication and fortunately there is not appear to be any evidence of active infection which is great news. No fevers, chills, nausea, vomiting, or diarrhea. 11/08/2020 upon evaluation today patient appears to be doing well with regard to his leg ulcer. This is actually making good progress and I think is very close to complete resolution. Fortunately there does not appear to be any signs of infection which is great news. Overall I am pleased with where things stand. Electronic Signature(s) Signed: 11/08/2020 10:40:06 AM By: Lenda KelpStone III, Brittne Kawasaki PA-C Entered By: Lenda KelpStone III, Blythe Hartshorn on 11/08/2020 10:40:06 [name], [name] ([id number]) -------------------------------------------------------------------------------- Physical Exam Details Patient Name: [name], [name] Date of Service: 11/08/2020 10:00 AM Medical Record Number: [id number] Patient Account Number: [id number] Date of Birth/Sex: 04-Jun-1946 (75  y.o. M) Treating RN: Yevonne PaxEpps, Carrie Primary Care Provider: Elizabeth SauerJones, Deanna Other Clinician: Lolita CramBurnette, Kyara Referring Provider: Elizabeth SauerJones, Deanna Treating Provider/Extender: Allen DerryStone, Erlinda Solinger Weeks in Treatment: 10 Constitutional Well-nourished and well-hydrated in no acute distress. Respiratory normal breathing without difficulty. Psychiatric this patient is able to make decisions and demonstrates good insight into disease process. Alert and Oriented x 3. pleasant and cooperative. Notes Upon inspection patient's wound requires no sharp debridement currently. Overall I think he is doing excellent he is very close to complete closure. Electronic Signature(s) Signed: 11/08/2020 10:41:54 AM By: Lenda KelpStone III, Aziah Kaiser PA-C Entered By: Lenda KelpStone III, Kentrel Clevenger on 11/08/2020 10:41:54 [name], [name] ([id number]) -------------------------------------------------------------------------------- Physician Orders Details Patient Name: [name], [name] Date of Service: 11/08/2020 10:00 AM Medical Record Number: [id number] Patient Account Number: [id number] Date of Birth/Sex: 04-Jun-1946 (75 y.o. M) Treating RN: Yevonne PaxEpps, Carrie Primary Care Provider: Elizabeth SauerJones, Deanna Other Clinician: Lolita CramBurnette, Kyara Referring Provider: Elizabeth SauerJones, Deanna Treating Provider/Extender: Rowan BlaseStone, Sequita Wise Weeks in Treatment: 10 Verbal / Phone Orders: No Diagnosis Coding ICD-10 Coding Code Description I87.2 Venous insufficiency (chronic) (peripheral) L97.822 Non-pressure chronic ulcer of other part of left lower leg with fat layer exposed I10 Essential (primary) hypertension R73.9 Hyperglycemia, unspecified Follow-up Appointments Wound #1 Left,Lateral Lower Leg o Return Appointment in 1 week. Anesthetic (Use 'Patient Medications' Section for Anesthetic Order Entry) Wound #1 Left,Lateral Lower Leg o Lidocaine applied to wound bed Wound Treatment Wound #1 - Lower Leg Wound Laterality: Left, Lateral Cleanser: Normal Saline 3 x Per Week/30 Days Discharge  Instructions: Wash your hands with soap and water. Remove old dressing, discard into plastic bag and place into trash. Cleanse the wound with Normal Saline prior to applying a clean dressing  using gauze sponges, not tissues or cotton balls. Do not scrub or use excessive force. Pat dry using gauze sponges, not tissue or cotton balls. Peri-Wound Care: Triamcinolone Acetonide Cream, 0.1%, 15 (g) tube (Generic) 3 x Per Week/30 Days Discharge Instructions: apply to peri wound Primary Dressing: Hydrofera Blue Ready Transfer Foam, 2.5x2.5 (in/in) (Generic) 3 x Per Week/30 Days Discharge Instructions: Apply Hydrofera Blue Ready to wound bed, cut to wound size Primary Dressing: stretch net (Generic) 3 x Per Week/30 Days Discharge Instructions: apply over kerlix Secondary Dressing: ABD Pad 5x9 (in/in) (Generic) 3 x Per Week/30 Days Discharge Instructions: Cover with ABD pad Secondary Dressing: Conforming Guaze Roll-Large (Generic) 3 x Per Week/30 Days Discharge Instructions: Apply Conforming Stretch Guaze Bandage as directed Secured With: 80M Medipore H Soft Cloth Surgical Tape, 2x2 (in/yd) (Generic) 3 x Per Week/30 Days Electronic Signature(s) Signed: 11/08/2020 5:03:26 PM By: Lenda Kelp PA-C Signed: 11/12/2020 8:10:37 AM By: Yevonne Pax RN Entered By: Yevonne Pax on 11/08/2020 10:18:13 [name] ([id number]) -------------------------------------------------------------------------------- Problem List Details Patient Name: [name] Date of Service: 11/08/2020 10:00 AM Medical Record Number: [id number] Patient Account Number: [id number] Date of Birth/Sex: [birth year] (75 y.o. M) Treating RN: Yevonne Pax Primary Care Provider: Elizabeth Sauer Other Clinician: Lolita Cram Referring Provider: Elizabeth Sauer Treating Provider/Extender: Rowan Blase in Treatment: 10 Active Problems ICD-10 Encounter Code Description Active Date MDM Diagnosis I87.2 Venous insufficiency (chronic)  (peripheral) 08/30/2020 No Yes L97.822 Non-pressure chronic ulcer of other part of left lower leg with fat layer 08/30/2020 No Yes exposed I10 Essential (primary) hypertension 08/30/2020 No Yes R73.9 Hyperglycemia, unspecified 08/30/2020 No Yes Inactive Problems Resolved Problems Electronic Signature(s) Signed: 11/08/2020 10:11:42 AM By: Lenda Kelp PA-C Entered By: Lenda Kelp on 11/08/2020 10:11:41 [name] ([id number]) -------------------------------------------------------------------------------- Progress Note Details Patient Name: [name] Date of Service: 11/08/2020 10:00 AM Medical Record Number: [id number] Patient Account Number: [id number] Date of Birth/Sex: Mar 16, 1946 (74 y.o. M) Treating RN: Yevonne Pax Primary Care Provider: Elizabeth Sauer Other Clinician: Lolita Cram Referring Provider: Elizabeth Sauer Treating Provider/Extender: Rowan Blase in Treatment: 10 Subjective Chief Complaint Information obtained from Patient Left LE Ulcer History of Present Illness (HPI) 08/30/2020 upon evaluation today patient presents today for inspection regarding a wound on his left lower extremity. He does have evidence of some chronic venous insufficiency and hemosiderin staining. With that being said he tells me this began after his stroke though he does not appear to be too swollen today this is good news. Fortunately there is no evidence of active infection at this time. No fevers, chills, nausea, vomiting, or diarrhea. He does have a history of hypertension and occasional elevated blood sugar levels although he does not have a formal diagnosis of diabetes according to what he tells me today. 09/06/2020 upon evaluation today patient appears to be doing well with regard to his leg ulcer. I do feel like the Tubigrip has helped and I do feel like as well but the patient seems to be making good progress all things considered. In the past week this has measured better and  seems to be healing quite well that is excellent news. Overall I am extremely pleased at this point. 09/13/2020 upon evaluation today patient appears to be doing well with regard to his wound. He has been tolerating the dressing changes with Hydrofera Blue without complication the wound bed appears to be doing significantly better which is great news. Overall I'm extremely pleased with where things stand. I do believe he still has  a little bit of time to get this to heal just due to the overall size of the wound but nonetheless I think we are headed in the appropriate direction making good progress based on what I'm seeing. 09/20/2020 upon evaluation today patient appears to be doing well with regard to his leg ulcer. He has been tolerating the dressing changes without complication. Fortunately there is no signs of active infection at this time. No fevers, chills, nausea, vomiting, or diarrhea. 09/27/2020 upon evaluation today patient appears to be doing well with regard to his wound. He has been tolerating the dressing changes without complication. Fortunately there is no signs of active infection which is great news. No fevers, chills, nausea, vomiting, or diarrhea. 10/04/2020 upon evaluation today patient appears to be doing well with regard to his wound. This is showing signs of improvement. The overall measurement is better and I am very pleased in that regard. With that being said were still having issues with the Tubigrip and getting the right sides for him. I am really not certain what exactly is going on here but again what is being sent does not seem to be appropriate for what needs to fit him. 10/11/2020 upon evaluation today patient appears to be doing more poorly with some irritation around the wound itself. The wound is doing better but again the skin irritation has not. Fortunately there does not appear to be any signs of active infection at this time. No fevers, chills, nausea, vomiting, or  diarrhea. 10/18/2020 upon evaluation today patient appears to be doing well with regard to his wound. Is been tolerating the dressing changes without complication. Fortunately there is no signs of active infection I do feel like putting the zinc on this area has done well for protecting over the past week. I am very pleased in that regard. 10/25/2020 upon evaluation today patient appears to be doing well with regard to his leg ulcer. He seems to be showing signs of improvement which is great news. Unfortunately the main issue that I see here is that he is having a big problem with what appears to be allergic reaction around the edges of the wound. That skin to need to be addressed currently. 11/01/2020 upon evaluation today patient appears to be doing well with regard to his wound on the leg. He has been tolerating the dressing changes without complication and fortunately there is not appear to be any evidence of active infection which is great news. No fevers, chills, nausea, vomiting, or diarrhea. 11/08/2020 upon evaluation today patient appears to be doing well with regard to his leg ulcer. This is actually making good progress and I think is very close to complete resolution. Fortunately there does not appear to be any signs of infection which is great news. Overall I am pleased with where things stand. Objective Constitutional Well-nourished and well-hydrated in no acute distress. [name], [name] ([id number]) Vitals Time Taken: 9:55 AM, Height: 71 in, Weight: 205 lbs, BMI: 28.6, Temperature: 97.8 F, Pulse: 74 bpm, Respiratory Rate: 18 breaths/min, Blood Pressure: 146/77 mmHg. Respiratory normal breathing without difficulty. Psychiatric this patient is able to make decisions and demonstrates good insight into disease process. Alert and Oriented x 3. pleasant and cooperative. General Notes: Upon inspection patient's wound requires no sharp debridement currently. Overall I think he is doing  excellent he is very close to complete closure. Integumentary (Hair, Skin) Wound #1 status is Open. Original cause of wound was Skin Tear/Laceration. The date acquired was: 07/30/2020. The wound  has been in treatment 10 weeks. The wound is located on the Left,Lateral Lower Leg. The wound measures 0.4cm length x 0.5cm width x 0.1cm depth; 0.157cm^2 area and 0.016cm^3 volume. There is Fat Layer (Subcutaneous Tissue) exposed. There is no tunneling or undermining noted. There is a none present amount of drainage noted. The wound margin is flat and intact. There is large (67-100%) red, pink granulation within the wound bed. There is no necrotic tissue within the wound bed. Assessment Active Problems ICD-10 Venous insufficiency (chronic) (peripheral) Non-pressure chronic ulcer of other part of left lower leg with fat layer exposed Essential (primary) hypertension Hyperglycemia, unspecified Plan Follow-up Appointments: Wound #1 Left,Lateral Lower Leg: Return Appointment in 1 week. Anesthetic (Use 'Patient Medications' Section for Anesthetic Order Entry): Wound #1 Left,Lateral Lower Leg: Lidocaine applied to wound bed WOUND #1: - Lower Leg Wound Laterality: Left, Lateral Cleanser: Normal Saline 3 x Per Week/30 Days Discharge Instructions: Wash your hands with soap and water. Remove old dressing, discard into plastic bag and place into trash. Cleanse the wound with Normal Saline prior to applying a clean dressing using gauze sponges, not tissues or cotton balls. Do not scrub or use excessive force. Pat dry using gauze sponges, not tissue or cotton balls. Peri-Wound Care: Triamcinolone Acetonide Cream, 0.1%, 15 (g) tube (Generic) 3 x Per Week/30 Days Discharge Instructions: apply to peri wound Primary Dressing: Hydrofera Blue Ready Transfer Foam, 2.5x2.5 (in/in) (Generic) 3 x Per Week/30 Days Discharge Instructions: Apply Hydrofera Blue Ready to wound bed, cut to wound size Primary Dressing:  stretch net (Generic) 3 x Per Week/30 Days Discharge Instructions: apply over kerlix Secondary Dressing: ABD Pad 5x9 (in/in) (Generic) 3 x Per Week/30 Days Discharge Instructions: Cover with ABD pad Secondary Dressing: Conforming Guaze Roll-Large (Generic) 3 x Per Week/30 Days Discharge Instructions: Apply Conforming Stretch Guaze Bandage as directed Secured With: 32M Medipore H Soft Cloth Surgical Tape, 2x2 (in/yd) (Generic) 3 x Per Week/30 Days 1. Would recommend currently that we going continue with the wound care measures as before and the patient is in agreement the plan that includes the use of the triamcinolone to the wound and periwound region followed by the [facility] secured with stretch net. 2. I am also can recommend just an ABD pad is all this needed over top this is not really draining significantly at this point. 3. I am also can recommend the patient continue to elevate his legs much as possible the swelling seems to be doing quite well. We will see patient back for reevaluation in 1 week here in the clinic. If anything worsens or changes patient will contact our office for additional recommendations. [name], [name] ([id number]) Electronic Signature(s) Signed: 11/08/2020 10:42:36 AM By: Lenda Kelp PA-C Entered By: Lenda Kelp on 11/08/2020 10:42:35 [name] ([id number]) -------------------------------------------------------------------------------- SuperBill Details Patient Name: [name] Date of Service: 11/08/2020 Medical Record Number: [id number] Patient Account Number: [id number] Date of Birth/Sex: [birth year] (74 y.o. M) Treating RN: Yevonne Pax Primary Care Provider: Elizabeth Sauer Other Clinician: Lolita Cram Referring Provider: Elizabeth Sauer Treating Provider/Extender: Rowan Blase in Treatment: 10 Diagnosis Coding ICD-10 Codes Code Description I87.2 Venous insufficiency (chronic) (peripheral) L97.822 Non-pressure chronic ulcer of  other part of left lower leg with fat layer exposed I10 Essential (primary) hypertension R73.9 Hyperglycemia, unspecified Facility Procedures CPT4 Code: 10626948 Description: 99213 - WOUND CARE VISIT-LEV 3 EST PT Modifier: Quantity: 1 Physician Procedures CPT4 Code: 5462703 Description: 99213 - WC PHYS LEVEL 3 - EST PT Modifier: Quantity: 1  CPT4 Code: Description: ICD-10 Diagnosis Description I87.2 Venous insufficiency (chronic) (peripheral) L97.822 Non-pressure chronic ulcer of other part of left lower leg with fat lay I10 Essential (primary) hypertension R73.9 Hyperglycemia, unspecified Modifier: er exposed Quantity: Electronic Signature(s) Signed: 11/08/2020 10:42:53 AM By: Lenda Kelp PA-C Entered By: Lenda Kelp on 11/08/2020 10:42:53

## 2020-11-12 NOTE — Progress Notes (Signed)
[name] ([id number]) Visit Report for 10/25/2020 Arrival Information Details Patient Name: [name] Date of Service: 10/25/2020 10:00 AM Medical Record Number: [id number] Patient Account Number: [id number] Date of Birth/Sex: 1946-03-07 (75 y.o. M) Treating RN: Dolan Amen Primary Care Amaal Dimartino: Otilio Miu Other Clinician: Jeanine Luz Referring Cadan Maggart: Otilio Miu Treating Ezekiel Menzer/Extender: Skipper Cliche in Treatment: 8 Visit Information History Since Last Visit Has Dressing in Place as Prescribed: Yes Patient Arrived: Cane Pain Present Now: No Arrival Time: 10:16 Accompanied By: self Transfer Assistance: None Patient Identification Verified: Yes Secondary Verification Process Completed: Yes Patient Requires Transmission-Based Precautions: No Patient Has Alerts: No Electronic Signature(s) Signed: 10/25/2020 4:25:39 PM By: Georges Mouse, Minus Breeding RN Entered By: Georges Mouse, Minus Breeding on 10/25/2020 10:17:26 [name] ([id number]) -------------------------------------------------------------------------------- Clinic Level of Care Assessment Details Patient Name: [name] Date of Service: 10/25/2020 10:00 AM Medical Record Number: [id number] Patient Account Number: [id number] Date of Birth/Sex: 06-26-1946 (74 y.o. M) Treating RN: Carlene Coria Primary Care Layan Zalenski: Otilio Miu Other Clinician: Jeanine Luz Referring Sirena Riddle: Otilio Miu Treating Cord Wilczynski/Extender: Skipper Cliche in Treatment: 8 Clinic Level of Care Assessment Items TOOL 4 Quantity Score X - Use when only an EandM is performed on FOLLOW-UP visit 1 0 ASSESSMENTS - Nursing Assessment / Reassessment X - Reassessment of Co-morbidities (includes updates in patient status) 1 10 X- 1 5 Reassessment of Adherence to Treatment Plan ASSESSMENTS - Wound and Skin Assessment / Reassessment X - Simple Wound Assessment / Reassessment - one wound 1 5 _0  - 0 Complex Wound  Assessment / Reassessment - multiple wounds _1  - 0 Dermatologic / Skin Assessment (not related to wound area) ASSESSMENTS - Focused Assessment _2  - Circumferential Edema Measurements - multi extremities 0 _3  - 0 Nutritional Assessment / Counseling / Intervention _4  - 0 Lower Extremity Assessment (monofilament, tuning fork, pulses) _5  - 0 Peripheral Arterial Disease Assessment (using hand held doppler) ASSESSMENTS - Ostomy and/or Continence Assessment and Care _6  - Incontinence Assessment and Management 0 _7  - 0 Ostomy Care Assessment and Management (repouching, etc.) PROCESS - Coordination of Care X - Simple Patient / Family Education for ongoing care 1 15 _8  - 0 Complex (extensive) Patient / Family Education for ongoing care X- 1 10 Staff obtains Consents, Records, Test Results / Process Orders _9  - 0 Staff telephones HHA, Nursing Homes / Clarify orders / etc _10  - 0 Routine Transfer to another Facility (non-emergent condition) _11  - 0 Routine Hospital Admission (non-emergent condition) _12  - 0 New Admissions / Biomedical engineer / Ordering NPWT, Apligraf, etc. _13  - 0 Emergency Hospital Admission (emergent condition) X- 1 10 Simple Discharge Coordination _14  - 0 Complex (extensive) Discharge Coordination PROCESS - Special Needs _15  - Pediatric / Minor Patient Management 0 _16  - 0 Isolation Patient Management _17  - 0 Hearing / Language / Visual special needs _18  - 0 Assessment of Community assistance (transportation, D/C planning, etc.) _19  - 0 Additional assistance / Altered mentation _20  - 0 Support Surface(s) Assessment (bed, cushion, seat, etc.) INTERVENTIONS - Wound Cleansing / Measurement [name] ([id number]) X- 1 5 Simple Wound Cleansing - one wound _21  - 0 Complex Wound Cleansing - multiple wounds X- 1 5 Wound Imaging (photographs - any number of wounds) _22  - 0 Wound Tracing (instead of photographs) X- 1 5 Simple Wound Measurement - one wound _23  -  0 Complex Wound Measurement - multiple wounds INTERVENTIONS - Wound Dressings X - Small Wound Dressing one or multiple wounds 1 10 _24  - 0 Medium Wound Dressing one or  multiple wounds _0  - 0 Large Wound Dressing one or multiple wounds X- 1 5 Application of Medications - topical <RCVELFYBOFBPZWCH>_8<\/NIDPOEUMPNTIRWER>_1  - 0 Application of Medications - injection INTERVENTIONS - Miscellaneous _2  - External ear exam 0 _3  - 0 Specimen Collection (cultures, biopsies, blood, body fluids, etc.) _4  - 0 Specimen(s) / Culture(s) sent or taken to Lab for analysis _5  - 0 Patient Transfer (multiple staff / Harrel Lemon Lift / Similar devices) _6  - 0 Simple Staple / Suture removal (25 or less) _7  - 0 Complex Staple / Suture removal (26 or more) _8  - 0 Hypo / Hyperglycemic Management (close monitor of Blood Glucose) _9  - 0 Ankle / Brachial Index (ABI) - do not check if billed separately X- 1 5 Vital Signs Has the patient been seen at the hospital within the last three years: Yes Total Score: 90 Level Of Care: New/Established - Level 3 Electronic Signature(s) Signed: 11/12/2020 8:11:40 AM By: Carlene Coria RN Entered By: Carlene Coria on 10/25/2020 10:54:52 [name] ([id number]) -------------------------------------------------------------------------------- Encounter Discharge Information Details Patient Name: [name] Date of Service: 10/25/2020 10:00 AM Medical Record Number: [id number] Patient Account Number: [id number] Date of Birth/Sex: 1946-03-16 (75 y.o. M) Treating RN: Cornell Barman Primary Care Roshun Klingensmith: Otilio Miu Other Clinician: Jeanine Luz Referring Rayden Scheper: Otilio Miu Treating Labib Cwynar/Extender: Skipper Cliche in Treatment: 8 Encounter Discharge Information Items Discharge Condition: Stable Ambulatory Status: Ambulatory Discharge Destination: Home Transportation: Private Auto Accompanied By: self Schedule Follow-up Appointment: Yes Clinical Summary of Care: Electronic Signature(s) Signed:  10/27/2020 6:23:16 PM By: Gretta Cool, BSN, RN, CWS, Kim RN, BSN Entered By: Gretta Cool, BSN, RN, CWS, Kim on 10/25/2020 11:08:20 [name] ([id number]) -------------------------------------------------------------------------------- Lower Extremity Assessment Details Patient Name: [name] Date of Service: 10/25/2020 10:00 AM Medical Record Number: [id number] Patient Account Number: [id number] Date of Birth/Sex: August 20, 1945 (74 y.o. M) Treating RN: Dolan Amen Primary Care Lacey Dotson: Otilio Miu Other Clinician: Jeanine Luz Referring Isak Sotomayor: Otilio Miu Treating Margerite Impastato/Extender: Jeri Cos Weeks in Treatment: 8 Edema Assessment Assessed: [Left: Yes] [Right: No] Edema: [Left: N] [Right: o] Calf Left: Right: Point of Measurement: 33 cm From Medial Instep 38 cm Ankle Left: Right: Point of Measurement: 10 cm From Medial Instep 24 cm Vascular Assessment Pulses: Dorsalis Pedis Palpable: [Left:Yes] Electronic Signature(s) Signed: 10/25/2020 4:25:39 PM By: Georges Mouse, Minus Breeding RN Entered By: Georges Mouse, Minus Breeding on 10/25/2020 10:22:03 [name] ([id number]) -------------------------------------------------------------------------------- Multi Wound Chart Details Patient Name: [name] Date of Service: 10/25/2020 10:00 AM Medical Record Number: [id number] Patient Account Number: [id number] Date of Birth/Sex: October 09, 1945 (75 y.o. M) Treating RN: Carlene Coria Primary Care Dory Demont: Otilio Miu Other Clinician: Jeanine Luz Referring Clemence Lengyel: Otilio Miu Treating Kellar Westberg/Extender: Skipper Cliche in Treatment: 8 Vital Signs Height(in): 71 Pulse(bpm): 59 Weight(lbs): 205 Blood Pressure(mmHg): 137/77 Body Mass Index(BMI): 29 Temperature(F): 97.9 Respiratory Rate(breaths/min): 18 Photos: [N/A:N/A] Wound Location: Left, Lateral Lower Leg N/A N/A Wounding Event: Skin Tear/Laceration N/A N/A Primary Etiology: Trauma, Other N/A N/A Comorbid  History: Human Immunodeficiency Virus, N/A N/A Hypertension Date Acquired: 07/30/2020 N/A N/A Weeks of Treatment: 8 N/A N/A Wound Status: Open N/A N/A Measurements L x W x D (cm) 1.5x1.7x0.1 N/A N/A Area (cm) : 2.003 N/A N/A Volume (cm) : 0.2 N/A N/A % Reduction in Area: 75.10% N/A N/A % Reduction in Volume: 75.20% N/A N/A Classification: Full Thickness Without Exposed N/A N/A Support Structures Exudate Amount: Medium N/A N/A Exudate Type: Sanguinous N/A N/A Exudate Color: red N/A N/A Wound Margin: Flat and Intact N/A N/A Granulation Amount: Large (67-100%) N/A N/A Granulation  Quality: Red N/A N/A Necrotic Amount: None Present (0%) N/A N/A Exposed Structures: Fat Layer (Subcutaneous Tissue): N/A N/A Yes Fascia: No Tendon: No Muscle: No Joint: No Bone: No Epithelialization: Large (67-100%) N/A N/A Treatment Notes Electronic Signature(s) Signed: 11/12/2020 8:11:40 AM By: Carlene Coria RN Entered By: Carlene Coria on 10/25/2020 10:49:09 [name] ([id number]) -------------------------------------------------------------------------------- Multi-Disciplinary Care Plan Details Patient Name: [name] Date of Service: 10/25/2020 10:00 AM Medical Record Number: [id number] Patient Account Number: [id number] Date of Birth/Sex: 1945-12-31 (75 y.o. M) Treating RN: Carlene Coria Primary Care Verenise Moulin: Otilio Miu Other Clinician: Jeanine Luz Referring Cana Mignano: Otilio Miu Treating Kaylise Blakeley/Extender: Skipper Cliche in Treatment: 8 Active Inactive Venous Leg Ulcer Nursing Diagnoses: Actual venous Insuffiency (use after diagnosis is confirmed) Goals: Patient will maintain optimal edema control Date Initiated: 08/30/2020 Target Resolution Date: 10/25/2020 Goal Status: Active Verify adequate tissue perfusion prior to therapeutic compression application Date Initiated: 08/30/2020 Date Inactivated: 09/06/2020 Target Resolution Date: 08/30/2020 Goal Status:  Met Interventions: Assess peripheral edema status every visit. Compression as ordered Provide education on venous insufficiency Notes: Electronic Signature(s) Signed: 11/12/2020 8:11:40 AM By: Carlene Coria RN Entered By: Carlene Coria on 10/25/2020 10:48:52 [name] ([id number]) -------------------------------------------------------------------------------- Pain Assessment Details Patient Name: [name] Date of Service: 10/25/2020 10:00 AM Medical Record Number: [id number] Patient Account Number: [id number] Date of Birth/Sex: [birth year] (74 y.o. M) Treating RN: Dolan Amen Primary Care Dalanie Kisner: Otilio Miu Other Clinician: Jeanine Luz Referring Yishai Rehfeld: Otilio Miu Treating Nylene Inlow/Extender: Skipper Cliche in Treatment: 8 Active Problems Location of Pain Severity and Description of Pain Patient Has Paino No Site Locations Rate the pain. Current Pain Level: 0 Pain Management and Medication Current Pain Management: Electronic Signature(s) Signed: 10/25/2020 4:25:39 PM By: Georges Mouse, Minus Breeding RN Entered By: Georges Mouse, Minus Breeding on 10/25/2020 10:18:15 [name] ([id number]) -------------------------------------------------------------------------------- Patient/Caregiver Education Details Patient Name: [name] Date of Service: 10/25/2020 10:00 AM Medical Record Number: [id number] Patient Account Number: [id number] Date of Birth/Gender: 08/09/1945 (75 y.o. M) Treating RN: Carlene Coria Primary Care Physician: Otilio Miu Other Clinician: Jeanine Luz Referring Physician: Otilio Miu Treating Physician/Extender: Skipper Cliche in Treatment: 8 Education Assessment Education Provided To: Patient Education Topics Provided Venous: Methods: Explain/Verbal Responses: State content correctly Electronic Signature(s) Signed: 11/12/2020 8:11:40 AM By: Carlene Coria RN Entered By: Carlene Coria on 10/25/2020 10:55:13 [name]  ([id number]) -------------------------------------------------------------------------------- Wound Assessment Details Patient Name: [name] Date of Service: 10/25/2020 10:00 AM Medical Record Number: [id number] Patient Account Number: [id number] Date of Birth/Sex: 1946/03/21 (74 y.o. M) Treating RN: Dolan Amen Primary Care Brigit Doke: Otilio Miu Other Clinician: Jeanine Luz Referring Jeffrie Stander: Otilio Miu Treating Pride Gonzales/Extender: Jeri Cos Weeks in Treatment: 8 Wound Status Wound Number: 1 Primary Etiology: Trauma, Other Wound Location: Left, Lateral Lower Leg Wound Status: Open Wounding Event: Skin Tear/Laceration Comorbid History: Human Immunodeficiency Virus, Hypertension Date Acquired: 07/30/2020 Weeks Of Treatment: 8 Clustered Wound: No Photos Wound Measurements Length: (cm) 1.5 Width: (cm) 1.7 Depth: (cm) 0.1 Area: (cm) 2.003 Volume: (cm) 0.2 % Reduction in Area: 75.1% % Reduction in Volume: 75.2% Epithelialization: Large (67-100%) Tunneling: No Undermining: No Wound Description Classification: Full Thickness Without Exposed Support Structu Wound Margin: Flat and Intact Exudate Amount: Medium Exudate Type: Sanguinous Exudate Color: red res Foul Odor After Cleansing: No Slough/Fibrino No Wound Bed Granulation Amount: Large (67-100%) Exposed Structure Granulation Quality: Red Fascia Exposed: No Necrotic Amount: None Present (0%) Fat Layer (Subcutaneous Tissue) Exposed: Yes Tendon Exposed: No Muscle Exposed: No Joint Exposed: No Bone Exposed: No Electronic Signature(s) Signed: 10/25/2020 4:25:39  PM By: Georges Mouse, Minus Breeding RN Entered By: Georges Mouse, Minus Breeding on 10/25/2020 10:21:18 [name] ([id number]) -------------------------------------------------------------------------------- Olin Details Patient Name: [name] Date of Service: 10/25/2020 10:00 AM Medical Record Number: [id number] Patient Account Number:  [id number] Date of Birth/Sex: October 14, 1945 (74 y.o. M) Treating RN: Dolan Amen Primary Care Trinty Marken: Otilio Miu Other Clinician: Jeanine Luz Referring Avion Kutzer: Otilio Miu Treating Amiyah Shryock/Extender: Skipper Cliche in Treatment: 8 Vital Signs Time Taken: 10:13 Temperature (F): 97.9 Height (in): 71 Pulse (bpm): 67 Weight (lbs): 205 Respiratory Rate (breaths/min): 18 Body Mass Index (BMI): 28.6 Blood Pressure (mmHg): 137/77 Reference Range: 80 - 120 mg / dl Electronic Signature(s) Signed: 10/25/2020 4:25:39 PM By: Georges Mouse, Minus Breeding RN Entered By: Georges Mouse, Minus Breeding on 10/25/2020 10:17:59

## 2020-11-12 NOTE — Progress Notes (Signed)
RAMCES, SHOMAKER ([id number]) Visit Report for 11/08/2020 Arrival Information Details Patient Name: [name], [name] Date of Service: 11/08/2020 10:00 AM Medical Record Number: [id number] Patient Account Number: [id number] Date of Birth/Sex: 1946-08-04 (75 y.o. M) Treating RN: Carlene Coria Primary Care Mackinley Cassaday: Otilio Miu Other Clinician: Jeanine Luz Referring Anayelli Lai: Otilio Miu Treating Kyre Jeffries/Extender: Skipper Cliche in Treatment: 10 Visit Information History Since Last Visit Added or deleted any medications: No Patient Arrived: Kasandra Knudsen Had a fall or experienced change in No Arrival Time: 09:53 activities of daily living that may affect Accompanied By: self risk of falls: Transfer Assistance: None Hospitalized since last visit: No Patient Identification Verified: Yes Pain Present Now: No Secondary Verification Process Completed: Yes Patient Requires Transmission-Based Precautions: No Patient Has Alerts: No Electronic Signature(s) Signed: 11/08/2020 4:17:01 PM By: Jeanine Luz Entered By: Jeanine Luz on 11/08/2020 09:53:55 [name] ([id number]) -------------------------------------------------------------------------------- Clinic Level of Care Assessment Details Patient Name: [name] Date of Service: 11/08/2020 10:00 AM Medical Record Number: [id number] Patient Account Number: [id number] Date of Birth/Sex: [birth year] (74 y.o. M) Treating RN: Carlene Coria Primary Care Chantrell Apsey: Otilio Miu Other Clinician: Jeanine Luz Referring Abednego Yeates: Otilio Miu Treating Mayah Urquidi/Extender: Skipper Cliche in Treatment: 10 Clinic Level of Care Assessment Items TOOL 4 Quantity Score X - Use when only an EandM is performed on FOLLOW-UP visit 1 0 ASSESSMENTS - Nursing Assessment / Reassessment X - Reassessment of Co-morbidities (includes updates in patient status) 1 10 X- 1 5 Reassessment of Adherence to Treatment Plan ASSESSMENTS - Wound and Skin  Assessment / Reassessment X - Simple Wound Assessment / Reassessment - one wound 1 5 _0  - 0 Complex Wound Assessment / Reassessment - multiple wounds _1  - 0 Dermatologic / Skin Assessment (not related to wound area) ASSESSMENTS - Focused Assessment _2  - Circumferential Edema Measurements - multi extremities 0 _3  - 0 Nutritional Assessment / Counseling / Intervention _4  - 0 Lower Extremity Assessment (monofilament, tuning fork, pulses) _5  - 0 Peripheral Arterial Disease Assessment (using hand held doppler) ASSESSMENTS - Ostomy and/or Continence Assessment and Care _6  - Incontinence Assessment and Management 0 _7  - 0 Ostomy Care Assessment and Management (repouching, etc.) PROCESS - Coordination of Care X - Simple Patient / Family Education for ongoing care 1 15 _8  - 0 Complex (extensive) Patient / Family Education for ongoing care _9  - 0 Staff obtains Programmer, systems, Records, Test Results / Process Orders _10  - 0 Staff telephones HHA, Nursing Homes / Clarify orders / etc _11  - 0 Routine Transfer to another Facility (non-emergent condition) _12  - 0 Routine Hospital Admission (non-emergent condition) _13  - 0 New Admissions / Biomedical engineer / Ordering NPWT, Apligraf, etc. _14  - 0 Emergency Hospital Admission (emergent condition) X- 1 10 Simple Discharge Coordination _15  - 0 Complex (extensive) Discharge Coordination PROCESS - Special Needs _16  - Pediatric / Minor Patient Management 0 _17  - 0 Isolation Patient Management _18  - 0 Hearing / Language / Visual special needs _19  - 0 Assessment of Community assistance (transportation, D/C planning, etc.) _20  - 0 Additional assistance / Altered mentation _21  - 0 Support Surface(s) Assessment (bed, cushion, seat, etc.) INTERVENTIONS - Wound Cleansing / Measurement [place], Tiernan ([id number]) X- 1 5 Simple Wound Cleansing - one wound _22  - 0 Complex Wound Cleansing - multiple wounds X- 1 5 Wound Imaging (photographs - any number of  wounds) _23  - 0 Wound Tracing (instead of photographs) X- 1 5 Simple Wound Measurement - one wound _24  - 0 Complex Wound Measurement - multiple wounds INTERVENTIONS - Wound Dressings  X - Small Wound Dressing one or multiple wounds 1 10 _0  - 0 Medium Wound Dressing one or multiple wounds _1  - 0 Large Wound Dressing one or multiple wounds X- 1 5 Application of Medications - topical <[id number]>_6<\/FKCLEXNTZGYFVCBS>_4  - 0 Application of Medications - injection INTERVENTIONS - Miscellaneous _3  - External ear exam 0 _4  - 0 Specimen Collection (cultures, biopsies, blood, body fluids, etc.) _5  - 0 Specimen(s) / Culture(s) sent or taken to Lab for analysis _6  - 0 Patient Transfer (multiple staff / Harrel Lemon Lift / Similar devices) _7  - 0 Simple Staple / Suture removal (25 or less) _8  - 0 Complex Staple / Suture removal (26 or more) _9  - 0 Hypo / Hyperglycemic Management (close monitor of Blood Glucose) _10  - 0 Ankle / Brachial Index (ABI) - do not check if billed separately X- 1 5 Vital Signs Has the patient been seen at the hospital within the last three years: Yes Total Score: 80 Level Of Care: New/Established - Level 3 Electronic Signature(s) Signed: 11/12/2020 8:10:37 AM By: Carlene Coria RN Entered By: Carlene Coria on 11/08/2020 10:19:03 [name] ([id number]) -------------------------------------------------------------------------------- Encounter Discharge Information Details Patient Name: [name] Date of Service: 11/08/2020 10:00 AM Medical Record Number: [id number] Patient Account Number: [id number] Date of Birth/Sex: 29-Jul-1946 (75 y.o. M) Treating RN: Dolan Amen Primary Care Latamara Melder: Otilio Miu Other Clinician: Jeanine Luz Referring Antoinne Spadaccini: Otilio Miu Treating Brandii Lakey/Extender: Skipper Cliche in Treatment: 10 Encounter Discharge Information Items Discharge Condition: Stable Ambulatory Status: Cane Discharge Destination: Home Transportation: Private Auto Accompanied  By: self Schedule Follow-up Appointment: Yes Clinical Summary of Care: Electronic Signature(s) Signed: 11/08/2020 4:55:51 PM By: Georges Mouse, Minus Breeding RN Entered By: Georges Mouse, Minus Breeding on 11/08/2020 10:28:07 [name] ([id number]) -------------------------------------------------------------------------------- Lower Extremity Assessment Details Patient Name: [name] Date of Service: 11/08/2020 10:00 AM Medical Record Number: [id number] Patient Account Number: [id number] Date of Birth/Sex: 12/03/1945 (74 y.o. M) Treating RN: Carlene Coria Primary Care Camyra Vaeth: Otilio Miu Other Clinician: Jeanine Luz Referring Reyah Streeter: Otilio Miu Treating Jabez Molner/Extender: Jeri Cos Weeks in Treatment: 10 Edema Assessment Assessed: Shirlyn Goltz: Yes] [Right: No] Edema: [Left: Ye] [Right: s] Calf Left: Right: Point of Measurement: 33 cm From Medial Instep 38.4 cm Ankle Left: Right: Point of Measurement: 10 cm From Medial Instep 24.1 cm Vascular Assessment Pulses: Dorsalis Pedis Palpable: [Left:Yes] Electronic Signature(s) Signed: 11/08/2020 4:17:01 PM By: Jeanine Luz Signed: 11/12/2020 8:10:37 AM By: Carlene Coria RN Entered By: Jeanine Luz on 11/08/2020 10:03:32 [name] ([id number]) -------------------------------------------------------------------------------- Multi Wound Chart Details Patient Name: [name] Date of Service: 11/08/2020 10:00 AM Medical Record Number: [id number] Patient Account Number: [id number] Date of Birth/Sex: 07-14-1946 (74 y.o. M) Treating RN: Carlene Coria Primary Care Dannielle Baskins: Otilio Miu Other Clinician: Jeanine Luz Referring Heiley Shaikh: Otilio Miu Treating Mayreli Alden/Extender: Skipper Cliche in Treatment: 10 Vital Signs Height(in): 71 Pulse(bpm): 15 Weight(lbs): 205 Blood Pressure(mmHg): 146/77 Body Mass Index(BMI): 29 Temperature(F): 97.8 Respiratory Rate(breaths/min): 18 Photos: [N/A:N/A] Wound Location:  Left, Lateral Lower Leg N/A N/A Wounding Event: Skin Tear/Laceration N/A N/A Primary Etiology: Trauma, Other N/A N/A Comorbid History: Human Immunodeficiency Virus, N/A N/A Hypertension Date Acquired: 07/30/2020 N/A N/A Weeks of Treatment: 10 N/A N/A Wound Status: Open N/A N/A Measurements L x W x D (cm) 0.4x0.5x0.1 N/A N/A Area (cm) : 0.157 N/A N/A Volume (cm) : 0.016 N/A N/A % Reduction in Area: 98.00% N/A N/A % Reduction in Volume: 98.00% N/A N/A Classification: Full Thickness Without Exposed N/A N/A Support Structures Exudate Amount: None Present N/A N/A Wound Margin: Flat and  Intact N/A N/A Granulation Amount: Large (67-100%) N/A N/A Granulation Quality: Red, Pink N/A N/A Necrotic Amount: None Present (0%) N/A N/A Exposed Structures: Fat Layer (Subcutaneous Tissue): N/A N/A Yes Fascia: No Tendon: No Muscle: No Joint: No Bone: No Epithelialization: Large (67-100%) N/A N/A Treatment Notes Electronic Signature(s) Signed: 11/12/2020 8:10:37 AM By: Carlene Coria RN Entered By: Carlene Coria on 11/08/2020 10:17:46 [name] ([id number]) -------------------------------------------------------------------------------- Multi-Disciplinary Care Plan Details Patient Name: [name] Date of Service: 11/08/2020 10:00 AM Medical Record Number: [id number] Patient Account Number: [id number] Date of Birth/Sex: [birth year] (74 y.o. M) Treating RN: Carlene Coria Primary Care Jaydin Boniface: Otilio Miu Other Clinician: Jeanine Luz Referring Delvonte Berenson: Otilio Miu Treating Muaz Shorey/Extender: Skipper Cliche in Treatment: 10 Active Inactive Venous Leg Ulcer Nursing Diagnoses: Actual venous Insuffiency (use after diagnosis is confirmed) Goals: Patient will maintain optimal edema control Date Initiated: 08/30/2020 Target Resolution Date: 12/02/2020 Goal Status: Active Verify adequate tissue perfusion prior to therapeutic compression application Date Initiated: 08/30/2020 Date  Inactivated: 09/06/2020 Target Resolution Date: 08/30/2020 Goal Status: Met Interventions: Assess peripheral edema status every visit. Compression as ordered Provide education on venous insufficiency Notes: Electronic Signature(s) Signed: 11/12/2020 8:10:37 AM By: Carlene Coria RN Entered By: Carlene Coria on 11/08/2020 10:17:39 [name] ([id number]) -------------------------------------------------------------------------------- Pain Assessment Details Patient Name: [name] Date of Service: 11/08/2020 10:00 AM Medical Record Number: [id number] Patient Account Number: [id number] Date of Birth/Sex: 28-Sep-1945 (74 y.o. M) Treating RN: Carlene Coria Primary Care Kein Carlberg: Otilio Miu Other Clinician: Jeanine Luz Referring Kelan Pritt: Otilio Miu Treating Mahad Newstrom/Extender: Skipper Cliche in Treatment: 10 Active Problems Location of Pain Severity and Description of Pain Patient Has Paino No Site Locations Rate the pain. Current Pain Level: 0 Pain Management and Medication Current Pain Management: Electronic Signature(s) Signed: 11/08/2020 4:17:01 PM By: Jeanine Luz Signed: 11/12/2020 8:10:37 AM By: Carlene Coria RN Entered By: Jeanine Luz on 11/08/2020 09:56:06 [name] ([id number]) -------------------------------------------------------------------------------- Patient/Caregiver Education Details Patient Name: [name] Date of Service: 11/08/2020 10:00 AM Medical Record Number: [id number] Patient Account Number: [id number] Date of Birth/Gender: 05/01/1946 (75 y.o. M) Treating RN: Carlene Coria Primary Care Physician: Otilio Miu Other Clinician: Jeanine Luz Referring Physician: Otilio Miu Treating Physician/Extender: Skipper Cliche in Treatment: 10 Education Assessment Education Provided To: Patient Education Topics Provided Venous: Methods: Explain/Verbal Responses: State content correctly Electronic Signature(s) Signed:  11/12/2020 8:10:37 AM By: Carlene Coria RN Entered By: Carlene Coria on 11/08/2020 10:19:20 [name] ([id number]) -------------------------------------------------------------------------------- Wound Assessment Details Patient Name: [name] Date of Service: 11/08/2020 10:00 AM Medical Record Number: [id number] Patient Account Number: [id number] Date of Birth/Sex: [birth year] (75 y.o. M) Treating RN: Carlene Coria Primary Care Tennelle Taflinger: Otilio Miu Other Clinician: Jeanine Luz Referring Laron Angelini: Otilio Miu Treating Spenser Harren/Extender: Jeri Cos Weeks in Treatment: 10 Wound Status Wound Number: 1 Primary Etiology: Trauma, Other Wound Location: Left, Lateral Lower Leg Wound Status: Open Wounding Event: Skin Tear/Laceration Comorbid History: Human Immunodeficiency Virus, Hypertension Date Acquired: 07/30/2020 Weeks Of Treatment: 10 Clustered Wound: No Photos Wound Measurements Length: (cm) 0.4 Width: (cm) 0.5 Depth: (cm) 0.1 Area: (cm) 0.157 Volume: (cm) 0.016 % Reduction in Area: 98% % Reduction in Volume: 98% Epithelialization: Large (67-100%) Tunneling: No Undermining: No Wound Description Classification: Full Thickness Without Exposed Support Structures Wound Margin: Flat and Intact Exudate Amount: None Present Foul Odor After Cleansing: No Slough/Fibrino No Wound Bed Granulation Amount: Large (67-100%) Exposed Structure Granulation Quality: Red, Pink Fascia Exposed: No Necrotic Amount: None Present (0%) Fat Layer (Subcutaneous Tissue) Exposed: Yes Tendon Exposed: No Muscle Exposed: No  Joint Exposed: No Bone Exposed: No Treatment Notes Wound #1 (Lower Leg) Wound Laterality: Left, Lateral Cleanser Normal Saline Discharge Instruction: Wash your hands with soap and water. Remove old dressing, discard into plastic bag and place into trash. Cleanse the wound with Normal Saline prior to applying a clean dressing using gauze sponges, not tissues or  cotton balls. Do not scrub or use excessive force. Pat dry using gauze sponges, not tissue or cotton balls. Peri-Wound Care EMMET, MESSER ([id number]) Triamcinolone Acetonide Cream, 0.1%, 15 (g) tube Discharge Instruction: apply to peri wound Topical Primary Dressing Hydrofera Blue Ready Transfer Foam, 2.5x2.5 (in/in) Discharge Instruction: Apply Hydrofera Blue Ready to wound bed, cut to wound size stretch net Discharge Instruction: apply over kerlix Secondary Dressing ABD Pad 5x9 (in/in) Discharge Instruction: Cover with ABD pad [place] Discharge Instruction: [place] as directed Secured With [facility] Surgical Tape, 2x2 (in/yd) Compression Wrap Compression Stockings Add-Ons Electronic Signature(s) Signed: 11/08/2020 4:17:01 PM By: Jeanine Luz Signed: 11/12/2020 8:10:37 AM By: Carlene Coria RN Entered By: Jeanine Luz on 11/08/2020 10:01:26 [name] ([id number]) -------------------------------------------------------------------------------- Vitals Details Patient Name: [name] Date of Service: 11/08/2020 10:00 AM Medical Record Number: [id number] Patient Account Number: [id number] Date of Birth/Sex: 1946-02-04 (74 y.o. M) Treating RN: Carlene Coria Primary Care Allessandra Bernardi: Otilio Miu Other Clinician: Jeanine Luz Referring Welborn Keena: Otilio Miu Treating Kaeleigh Westendorf/Extender: Skipper Cliche in Treatment: 10 Vital Signs Time Taken: 09:55 Temperature (F): 97.8 Height (in): 71 Pulse (bpm): 74 Weight (lbs): 205 Respiratory Rate (breaths/min): 18 Body Mass Index (BMI): 28.6 Blood Pressure (mmHg): 146/77 Reference Range: 80 - 120 mg / dl Electronic Signature(s) Signed: 11/08/2020 4:17:01 PM By: Jeanine Luz Entered By: Jeanine Luz on 11/08/2020 09:55:43

## 2020-11-15 ENCOUNTER — Encounter: Payer: Medicare Other | Admitting: Physician Assistant

## 2020-11-15 ENCOUNTER — Other Ambulatory Visit: Payer: Self-pay

## 2020-11-15 DIAGNOSIS — L97822 Non-pressure chronic ulcer of other part of left lower leg with fat layer exposed: Secondary | ICD-10-CM | POA: Diagnosis not present

## 2020-11-15 DIAGNOSIS — I10 Essential (primary) hypertension: Secondary | ICD-10-CM | POA: Diagnosis not present

## 2020-11-15 DIAGNOSIS — S81812A Laceration without foreign body, left lower leg, initial encounter: Secondary | ICD-10-CM | POA: Diagnosis not present

## 2020-11-15 DIAGNOSIS — I872 Venous insufficiency (chronic) (peripheral): Secondary | ICD-10-CM | POA: Diagnosis not present

## 2020-11-15 DIAGNOSIS — R739 Hyperglycemia, unspecified: Secondary | ICD-10-CM | POA: Diagnosis not present

## 2020-11-15 NOTE — Progress Notes (Addendum)
ANDRZEJ, SCULLY ([id number]) Visit Report for 11/15/2020 Chief Complaint Document Details Patient Name: [name], [name] Date of Service: 11/15/2020 10:00 AM Medical Record Number: [id number] Patient Account Number: [id number] Date of Birth/Sex: February 24, 1946 (75 y.o. M) Treating RN: [name] Primary Care Provider: Elizabeth Kennedy Other Clinician: Lolita Kennedy Referring Provider: Elizabeth Kennedy Treating Provider/Extender: [name] in Treatment: 11 Information Obtained from: Patient Chief Complaint Left LE Ulcer Electronic Signature(s) Signed: 11/15/2020 9:52:25 AM By: Mark Kelp PA-C Entered By: [name] on 11/15/2020 09:52:25 [name] ([id number]) -------------------------------------------------------------------------------- HPI Details Patient Name: [name] Date of Service: 11/15/2020 10:00 AM Medical Record Number: [id number] Patient Account Number: [id number] Date of Birth/Sex: 10-11-1945 (74 y.o. M) Treating RN: [name] Primary Care Provider: Elizabeth Kennedy Other Clinician: Lolita Kennedy Referring Provider: Elizabeth Kennedy Treating Provider/Extender: [name] in Treatment: 11 History of Present Illness HPI Description: 08/30/2020 upon evaluation today patient presents today for inspection regarding a wound on his left lower extremity. He does have evidence of some chronic venous insufficiency and hemosiderin staining. With that being said he tells me this began after his stroke though he does not appear to be too swollen today this is good news. Fortunately there is no evidence of active infection at this time. No fevers, chills, nausea, vomiting, or diarrhea. He does have a history of hypertension and occasional elevated blood sugar levels although he does not have a formal diagnosis of diabetes according to what he tells me today. 09/06/2020 upon evaluation today patient appears to be doing well with regard to his leg ulcer. I do  feel like the Tubigrip has helped and I do feel like as well but the patient seems to be making good progress all things considered. In the past week this has measured better and seems to be healing quite well that is excellent news. Overall I am extremely pleased at this point. 09/13/2020 upon evaluation today patient appears to be doing well with regard to his wound. He has been tolerating the dressing changes with Hydrofera Blue without complication the wound bed appears to be doing significantly better which is great news. Overall I'm extremely pleased with where things stand. I do believe he still has a little bit of time to get this to heal just due to the overall size of the wound but nonetheless I think we are headed in the appropriate direction making good progress based on what I'm seeing. 09/20/2020 upon evaluation today patient appears to be doing well with regard to his leg ulcer. He has been tolerating the dressing changes without complication. Fortunately there is no signs of active infection at this time. No fevers, chills, nausea, vomiting, or diarrhea. 09/27/2020 upon evaluation today patient appears to be doing well with regard to his wound. He has been tolerating the dressing changes without complication. Fortunately there is no signs of active infection which is great news. No fevers, chills, nausea, vomiting, or diarrhea. 10/04/2020 upon evaluation today patient appears to be doing well with regard to his wound. This is showing signs of improvement. The overall measurement is better and I am very pleased in that regard. With that being said were still having issues with the Tubigrip and getting the right sides for him. I am really not certain what exactly is going on here but again what is being sent does not seem to be appropriate for what needs to fit him. 10/11/2020 upon evaluation today patient appears to be doing more poorly with some irritation around the  wound itself. The wound  is doing better but again the skin irritation has not. Fortunately there does not appear to be any signs of active infection at this time. No fevers, chills, nausea, vomiting, or diarrhea. 10/18/2020 upon evaluation today patient appears to be doing well with regard to his wound. Is been tolerating the dressing changes without complication. Fortunately there is no signs of active infection I do feel like putting the zinc on this area has done well for protecting over the past week. I am very pleased in that regard. 10/25/2020 upon evaluation today patient appears to be doing well with regard to his leg ulcer. He seems to be showing signs of improvement which is great news. Unfortunately the main issue that I see here is that he is having a big problem with what appears to be allergic reaction around the edges of the wound. That skin to need to be addressed currently. 11/01/2020 upon evaluation today patient appears to be doing well with regard to his wound on the leg. He has been tolerating the dressing changes without complication and fortunately there is not appear to be any evidence of active infection which is great news. No fevers, chills, nausea, vomiting, or diarrhea. 11/08/2020 upon evaluation today patient appears to be doing well with regard to his leg ulcer. This is actually making good progress and I think is very close to complete resolution. Fortunately there does not appear to be any signs of infection which is great news. Overall I am pleased with where things stand. 11/15/2020 upon evaluation today patient appears to be doing excellent in regard to his leg ulcer. In fact this appears to be completely healed which is great news. Fortunately there is no signs of active infection systemically either overall I am very pleased with where things stand today. Electronic Signature(s) Signed: 11/15/2020 10:20:33 AM By: Mark KelpStone III, Marriah Sanderlin PA-C Entered By: Mark KelpStone III, Kaiyana Bedore on 11/15/2020  10:20:33 [name], [name] ([id number]) -------------------------------------------------------------------------------- Physical Exam Details Patient Name: [name], [name] Date of Service: 11/15/2020 10:00 AM Medical Record Number: [id number] Patient Account Number: [id number] Date of Birth/Sex: May 24, 1946 (74 y.o. M) Treating RN: Mark PaxEpps, Carrie Primary Care Provider: Elizabeth SauerJones, Deanna Other Clinician: Lolita CramBurnette, Kyara Referring Provider: Elizabeth SauerJones, Deanna Treating Provider/Extender: Mark BlaseStone, Jahmiya Guidotti Weeks in Treatment: 11 Constitutional Well-nourished and well-hydrated in no acute distress. Respiratory normal breathing without difficulty. Psychiatric this patient is able to make decisions and demonstrates good insight into disease process. Alert and Oriented x 3. pleasant and cooperative. Notes Patient's wound bed showed signs of good epithelization at this point. There does not appear to be any signs of infection which is great and overall I am extremely pleased with where we stand at this time. No fevers, chills, nausea, vomiting, or diarrhea. Electronic Signature(s) Signed: 11/15/2020 10:20:54 AM By: Mark KelpStone III, Jaze Rodino PA-C Entered By: Mark KelpStone III, Olan Kurek on 11/15/2020 10:20:53 [name], [name] ([id number]) -------------------------------------------------------------------------------- Physician Orders Details Patient Name: [name], [name] Date of Service: 11/15/2020 10:00 AM Medical Record Number: [id number] Patient Account Number: [id number] Date of Birth/Sex: May 24, 1946 (74 y.o. M) Treating RN: Mark PaxEpps, Carrie Primary Care Provider: Elizabeth SauerJones, Deanna Other Clinician: Lolita CramBurnette, Kyara Referring Provider: Elizabeth SauerJones, Deanna Treating Provider/Extender: Mark BlaseStone, Diantha Paxson Weeks in Treatment: 11 Verbal / Phone Orders: No Diagnosis Coding ICD-10 Coding Code Description I87.2 Venous insufficiency (chronic) (peripheral) L97.822 Non-pressure chronic ulcer of other part of left lower leg with fat layer exposed I10 Essential  (primary) hypertension R73.9 Hyperglycemia, unspecified [facility] Treatment Complete - apply dressing  over wound for protection times week Electronic Signature(s) Signed: 11/15/2020 4:31:50 PM By: Mark Kelp PA-C Signed: 11/15/2020 5:13:28 PM By: Mark Pax RN Entered By: [name] on 11/15/2020 10:17:41 [name] ([id number]) -------------------------------------------------------------------------------- Problem List Details Patient Name: [name] Date of Service: 11/15/2020 10:00 AM Medical Record Number: [id number] Patient Account Number: [id number] Date of Birth/Sex: Oct 13, 1945 (75 y.o. M) Treating RN: [name] Primary Care Provider: Elizabeth Kennedy Other Clinician: Lolita Kennedy Referring Provider: Elizabeth Kennedy Treating Provider/Extender: [name] in Treatment: 11 Active Problems ICD-10 Encounter Code Description Active Date MDM Diagnosis I87.2 Venous insufficiency (chronic) (peripheral) 08/30/2020 No Yes L97.822 Non-pressure chronic ulcer of other part of left lower leg with fat layer 08/30/2020 No Yes exposed I10 Essential (primary) hypertension 08/30/2020 No Yes R73.9 Hyperglycemia, unspecified 08/30/2020 No Yes Inactive Problems Resolved Problems Electronic Signature(s) Signed: 11/15/2020 9:52:19 AM By: Mark Kelp PA-C Entered By: [name] on 11/15/2020 09:52:19 [name] ([id number]) -------------------------------------------------------------------------------- Progress Note Details Patient Name: [name] Date of Service: 11/15/2020 10:00 AM Medical Record Number: [id number] Patient Account Number: [id number] Date of Birth/Sex: 1945/11/10 (74 y.o. M) Treating RN: [name] Primary Care Provider: Elizabeth Kennedy Other Clinician: Lolita Kennedy Referring Provider: Elizabeth Kennedy Treating Provider/Extender: [name] in Treatment:  11 Subjective Chief Complaint Information obtained from Patient Left LE Ulcer History of Present Illness (HPI) 08/30/2020 upon evaluation today patient presents today for inspection regarding a wound on his left lower extremity. He does have evidence of some chronic venous insufficiency and hemosiderin staining. With that being said he tells me this began after his stroke though he does not appear to be too swollen today this is good news. Fortunately there is no evidence of active infection at this time. No fevers, chills, nausea, vomiting, or diarrhea. He does have a history of hypertension and occasional elevated blood sugar levels although he does not have a formal diagnosis of diabetes according to what he tells me today. 09/06/2020 upon evaluation today patient appears to be doing well with regard to his leg ulcer. I do feel like the Tubigrip has helped and I do feel like as well but the patient seems to be making good progress all things considered. In the past week this has measured better and seems to be healing quite well that is excellent news. Overall I am extremely pleased at this point. 09/13/2020 upon evaluation today patient appears to be doing well with regard to his wound. He has been tolerating the dressing changes with Hydrofera Blue without complication the wound bed appears to be doing significantly better which is great news. Overall I'm extremely pleased with where things stand. I do believe he still has a little bit of time to get this to heal just due to the overall size of the wound but nonetheless I think we are headed in the appropriate direction making good progress based on what I'm seeing. 09/20/2020 upon evaluation today patient appears to be doing well with regard to his leg ulcer. He has been tolerating the dressing changes without complication. Fortunately there is no signs of active infection at this time. No fevers, chills, nausea, vomiting, or diarrhea. 09/27/2020  upon evaluation today patient appears to be doing well with regard to his wound. He has been tolerating the dressing changes without complication. Fortunately there is no signs of active infection which is great news. No fevers, chills, nausea, vomiting, or diarrhea. 10/04/2020 upon evaluation today patient appears to be doing well with regard to his  wound. This is showing signs of improvement. The overall measurement is better and I am very pleased in that regard. With that being said were still having issues with the Tubigrip and getting the right sides for him. I am really not certain what exactly is going on here but again what is being sent does not seem to be appropriate for what needs to fit him. 10/11/2020 upon evaluation today patient appears to be doing more poorly with some irritation around the wound itself. The wound is doing better but again the skin irritation has not. Fortunately there does not appear to be any signs of active infection at this time. No fevers, chills, nausea, vomiting, or diarrhea. 10/18/2020 upon evaluation today patient appears to be doing well with regard to his wound. Is been tolerating the dressing changes without complication. Fortunately there is no signs of active infection I do feel like putting the zinc on this area has done well for protecting over the past week. I am very pleased in that regard. 10/25/2020 upon evaluation today patient appears to be doing well with regard to his leg ulcer. He seems to be showing signs of improvement which is great news. Unfortunately the main issue that I see here is that he is having a big problem with what appears to be allergic reaction around the edges of the wound. That skin to need to be addressed currently. 11/01/2020 upon evaluation today patient appears to be doing well with regard to his wound on the leg. He has been tolerating the dressing changes without complication and fortunately there is not appear to be any  evidence of active infection which is great news. No fevers, chills, nausea, vomiting, or diarrhea. 11/08/2020 upon evaluation today patient appears to be doing well with regard to his leg ulcer. This is actually making good progress and I think is very close to complete resolution. Fortunately there does not appear to be any signs of infection which is great news. Overall I am pleased with where things stand. 11/15/2020 upon evaluation today patient appears to be doing excellent in regard to his leg ulcer. In fact this appears to be completely healed which is great news. Fortunately there is no signs of active infection systemically either overall I am very pleased with where things stand today. Objective [name], [name] ([id number]) Constitutional Well-nourished and well-hydrated in no acute distress. Vitals Time Taken: 10:02 AM, Height: 71 in, Weight: 205 lbs, BMI: 28.6, Temperature: 98.1 F, Pulse: 69 bpm, Respiratory Rate: 16 breaths/min, Blood Pressure: 133/74 mmHg. Respiratory normal breathing without difficulty. Psychiatric this patient is able to make decisions and demonstrates good insight into disease process. Alert and Oriented x 3. pleasant and cooperative. General Notes: Patient's wound bed showed signs of good epithelization at this point. There does not appear to be any signs of infection which is great and overall I am extremely pleased with where we stand at this time. No fevers, chills, nausea, vomiting, or diarrhea. Integumentary (Hair, Skin) Wound #1 status is Open. Original cause of wound was Skin Tear/Laceration. The date acquired was: 07/30/2020. The wound has been in treatment 11 weeks. The wound is located on the Left,Lateral Lower Leg. The wound measures 0cm length x 0cm width x 0cm depth; 0cm^2 area and 0cm^3 volume. There is no tunneling or undermining noted. There is a none present amount of drainage noted. The wound margin is flat and intact. There is no granulation  within the wound bed. There is no necrotic tissue within the  wound bed. Assessment Active Problems ICD-10 Venous insufficiency (chronic) (peripheral) Non-pressure chronic ulcer of other part of left lower leg with fat layer exposed Essential (primary) hypertension Hyperglycemia, unspecified Plan Discharge From [facility]: Discharge from Wound Care Center Treatment Complete - apply dressing over wound for protection times week 1. Would recommend that we going to continue with the wound care measures as before and the patient is in agreement with plan this includes the use of elevation to keep anything from swelling especially considering his get ready to take a road trip. 2. I am also can recommend an ABD pad and stretch not just to protect the area I do not think he needs any other medicines or dressings he can put lotion on at night when he takes this off and leave it open to air. We will see him back for follow-up visit as needed. Electronic Signature(s) Signed: 11/15/2020 10:21:23 AM By: Mark Kelp PA-C Entered By: [name] on 11/15/2020 10:21:23 [name] ([id number]) -------------------------------------------------------------------------------- SuperBill Details Patient Name: [name] Date of Service: 11/15/2020 Medical Record Number: [id number] Patient Account Number: [id number] Date of Birth/Sex: [birth year] (74 y.o. M) Treating RN: [name] Primary Care Provider: Elizabeth Kennedy Other Clinician: Lolita Kennedy Referring Provider: Elizabeth Kennedy Treating Provider/Extender: [name] in Treatment: 11 Diagnosis Coding ICD-10 Codes Code Description I87.2 Venous insufficiency (chronic) (peripheral) L97.822 Non-pressure chronic ulcer of other part of left lower leg with fat layer exposed I10 Essential (primary) hypertension R73.9 Hyperglycemia, unspecified Facility Procedures CPT4 Code: [id number] Description: [phone] - WOUND CARE VISIT-LEV 2  EST PT Modifier: Quantity: 1 Physician Procedures CPT4 Code: [id number] Description: 99213 - WC PHYS LEVEL 3 - EST PT Modifier: Quantity: 1 CPT4 Code: Description: ICD-10 Diagnosis Description I87.2 Venous insufficiency (chronic) (peripheral) L97.822 Non-pressure chronic ulcer of other part of left lower leg with fat lay I10 Essential (primary) hypertension R73.9 Hyperglycemia, unspecified Modifier: er exposed Quantity: Electronic Signature(s) Signed: 11/15/2020 10:21:34 AM By: Mark Kelp PA-C Entered By: [name] on 11/15/2020 10:21:34

## 2020-11-15 NOTE — Progress Notes (Addendum)
RIGO, LETTS ([id number]) Visit Report for 11/15/2020 Arrival Information Details Patient Name: [name], [name] Date of Service: 11/15/2020 10:00 AM Medical Record Number: [id number] Patient Account Number: [id number] Date of Birth/Sex: 1946/02/26 (75 y.o. M) Treating RN: Hansel Feinstein Primary Care Persephanie Laatsch: Elizabeth Sauer Other Clinician: Lolita Cram Referring Lakena Sparlin: Elizabeth Sauer Treating Davinity Fanara/Extender: Rowan Blase in Treatment: 11 Visit Information History Since Last Visit Added or deleted any medications: No Patient Arrived: Gilmer Mor Had a fall or experienced change in No Arrival Time: 10:01 activities of daily living that may affect Accompanied By: self risk of falls: Transfer Assistance: None Hospitalized since last visit: No Patient Identification Verified: Yes Has Dressing in Place as Prescribed: Yes Secondary Verification Process Completed: Yes Pain Present Now: No Patient Requires Transmission-Based Precautions: No Patient Has Alerts: No Electronic Signature(s) Signed: 11/15/2020 4:36:12 PM By: Hansel Feinstein Entered By: Hansel Feinstein on 11/15/2020 10:05:02 Mark Parkins ([id number]) -------------------------------------------------------------------------------- Clinic Level of Care Assessment Details Patient Name: Mark Parkins Date of Service: 11/15/2020 10:00 AM Medical Record Number: [id number] Patient Account Number: [id number] Date of Birth/Sex: Oct 15, 1945 (74 y.o. M) Treating RN: Yevonne Pax Primary Care Pasqualino Witherspoon: Elizabeth Sauer Other Clinician: Lolita Cram Referring Malaka Ruffner: Elizabeth Sauer Treating Dempsey Knotek/Extender: Rowan Blase in Treatment: 11 Clinic Level of Care Assessment Items TOOL 4 Quantity Score X - Use when only an EandM is performed on FOLLOW-UP visit 1 0 ASSESSMENTS - Nursing Assessment / Reassessment X - Reassessment of Co-morbidities (includes updates in patient status) 1 10 X- 1 5 Reassessment of Adherence to Treatment  Plan ASSESSMENTS - Wound and Skin Assessment / Reassessment X - Simple Wound Assessment / Reassessment - one wound 1 5 []  - 0 Complex Wound Assessment / Reassessment - multiple wounds []  - 0 Dermatologic / Skin Assessment (not related to wound area) ASSESSMENTS - Focused Assessment []  - Circumferential Edema Measurements - multi extremities 0 []  - 0 Nutritional Assessment / Counseling / Intervention []  - 0 Lower Extremity Assessment (monofilament, tuning fork, pulses) []  - 0 Peripheral Arterial Disease Assessment (using hand held doppler) ASSESSMENTS - Ostomy and/or Continence Assessment and Care []  - Incontinence Assessment and Management 0 []  - 0 Ostomy Care Assessment and Management (repouching, etc.) PROCESS - Coordination of Care X - Simple Patient / Family Education for ongoing care 1 15 []  - 0 Complex (extensive) Patient / Family Education for ongoing care []  - 0 Staff obtains , Records, Test Results / Process Orders []  - 0 Staff telephones HHA, Nursing Homes / Clarify orders / etc []  - 0 Routine Transfer to another Facility (non-emergent condition) []  - 0 Routine Hospital Admission (non-emergent condition) []  - 0 New Admissions / / Ordering NPWT, Apligraf, etc. X- 1 20 Emergency Hospital Admission (emergent condition) []  - 0 Simple Discharge Coordination []  - 0 Complex (extensive) Discharge Coordination PROCESS - Special Needs []  - Pediatric / Minor Patient Management 0 []  - 0 Isolation Patient Management []  - 0 Hearing / Language / Visual special needs []  - 0 Assessment of Community assistance (transportation, D/C planning, etc.) []  - 0 Additional assistance / Altered mentation []  - 0 Support Surface(s) Assessment (bed, cushion, seat, etc.) INTERVENTIONS - Wound Cleansing / Measurement Mussa, Ola ( ) X- 1 5 Simple Wound Cleansing - one wound []  - 0 Complex Wound Cleansing - multiple wounds X- 1 5 Wound  Imaging (photographs - any number of wounds) []  - 0 Wound Tracing (instead of photographs) []  - 0 Simple Wound Measurement - one wound []  - 0 Complex Wound Measurement -  multiple wounds INTERVENTIONS - Wound Dressings []  - Small Wound Dressing one or multiple wounds 0 []  - 0 Medium Wound Dressing one or multiple wounds []  - 0 Large Wound Dressing one or multiple wounds []  - 0 Application of Medications - topical []  - 0 Application of Medications - injection INTERVENTIONS - Miscellaneous []  - External ear exam 0 []  - 0 Specimen Collection (cultures, biopsies, blood, body fluids, etc.) []  - 0 Specimen(s) / Culture(s) sent or taken to Lab for analysis []  - 0 Patient Transfer (multiple staff / Lift / Similar devices) []  - 0 Simple Staple / Suture removal (25 or less) []  - 0 Complex Staple / Suture removal (26 or more) []  - 0 Hypo / Hyperglycemic Management (close monitor of Blood Glucose) []  - 0 Ankle / Brachial Index (ABI) - do not check if billed separately X- 1 5 Vital Signs Has the patient been seen at the hospital within the last three years: Yes Total Score: 70 Level Of Care: New/Established - Level 2 Electronic Signature(s) Signed: 11/15/2020 5:13:28 PM By: RN Entered By: on 11/15/2020 10:18:14 ( ) -------------------------------------------------------------------------------- Encounter Discharge Information Details Patient Name: Date of Service: 11/15/2020 10:00 AM Medical Record Number: Michiel Sites Patient Account Number: Date of Birth/Sex: May 26, 1946 (75 y.o. M) Treating RN: Primary Care Austin Pongratz: 01/15/2021 Other Clinician: Yevonne Pax Referring Velva Molinari: Yevonne Pax Treating Tanecia Mccay/Extender: 01/15/2021 in Treatment: 11 Encounter Discharge Information Items Discharge Condition: Stable Ambulatory Status: Ambulatory Discharge Destination:  Home Transportation: Private Auto Accompanied By: self Schedule Follow-up Appointment: Yes Clinical Summary of Care: Patient Declined Electronic Signature(s) Signed: 11/15/2020 5:13:28 PM By: Mark Parkins RN Entered By: 01/15/2021 on 11/15/2020 10:19:10 [id number] (02/27/1946) -------------------------------------------------------------------------------- Lower Extremity Assessment Details Patient Name: 66 Date of Service: 11/15/2020 10:00 AM Medical Record Number: Elizabeth Sauer Patient Account Number: Lolita Cram Date of Birth/Sex: 1946-05-18 (74 y.o. M) Treating RN: 12 Primary Care Gaston Dase: 01/15/2021 Other Clinician: Yevonne Pax Referring Viktoria Gruetzmacher: Yevonne Pax Treating Chanler Mendonca/Extender: 01/15/2021 Weeks in Treatment: 11 Edema Assessment Assessed: [Left: Yes] Mark Parkins: No] [Left: Edema] [Right: :] Calf Left: Right: Point of Measurement: 33 cm From Medial Instep 38.4 cm Ankle Left: Right: Point of Measurement: 10 cm From Medial Instep 24 cm Vascular Assessment Pulses: Dorsalis Pedis Palpable: [Left:Yes] Electronic Signature(s) Signed: 11/15/2020 4:36:12 PM By: Mark Parkins Entered By: 01/15/2021 on 11/15/2020 10:10:16 [id number] (02/27/1946) -------------------------------------------------------------------------------- Multi Wound Chart Details Patient Name: 02-13-1999 Date of Service: 11/15/2020 10:00 AM Medical Record Number: Elizabeth Sauer Patient Account Number: Lolita Cram Date of Birth/Sex: 1946/01/13 (74 y.o. M) Treating RN: Franne Forts Primary Care Marvalene Barrett: 01/15/2021 Other Clinician: Hansel Feinstein Referring Neeraj Housand: Hansel Feinstein Treating Tiearra Colwell/Extender: 01/15/2021 in Treatment: 11 Vital Signs Height(in): 71 Pulse(bpm): 69 Weight(lbs): 205 Blood Pressure(mmHg): 133/74 Body Mass Index(BMI): 29 Temperature(F): 98.1 Respiratory Rate(breaths/min): 16 Photos: [N/A:N/A] Wound Location: Left, Lateral  Lower Leg N/A N/A Wounding Event: Skin Tear/Laceration N/A N/A Primary Etiology: Trauma, Other N/A N/A Comorbid History: Human Immunodeficiency Virus, N/A N/A Hypertension Date Acquired: 07/30/2020 N/A N/A Weeks of Treatment: 11 N/A N/A Wound Status: Open N/A N/A Measurements L x W x D (cm) 0x0x0 N/A N/A Area (cm) : 0 N/A N/A Volume (cm) : 0 N/A N/A % Reduction in Area: 100.00% N/A N/A % Reduction in Volume: 100.00% N/A N/A Classification: Full Thickness Without Exposed N/A N/A Support Structures Exudate Amount: None Present N/A N/A Wound Margin: Flat and Intact N/A N/A Granulation  Amount: None Present (0%) N/A N/A Necrotic Amount: None Present (0%) N/A N/A Exposed Structures: Fascia: No N/A N/A Fat Layer (Subcutaneous Tissue): No Tendon: No Muscle: No Joint: No Bone: No Epithelialization: Large (67-100%) N/A N/A Treatment Notes Electronic Signature(s) Signed: 11/15/2020 5:13:28 PM By: Yevonne PaxEpps, Carrie RN Entered By: Yevonne PaxEpps, Carrie on 11/15/2020 10:16:56 [name], [name] ([id number]) -------------------------------------------------------------------------------- Multi-Disciplinary Care Plan Details Patient Name: [name], [name] Date of Service: 11/15/2020 10:00 AM Medical Record Number: [id number] Patient Account Number: [id number] Date of Birth/Sex: Jun 09, 1946 (75 y.o. M) Treating RN: Yevonne PaxEpps, Carrie Primary Care Bryanna Yim: Elizabeth SauerJones, Deanna Other Clinician: Lolita CramBurnette, Kyara Referring Sabra Sessler: Elizabeth SauerJones, Deanna Treating Ariya Bohannon/Extender: Allen DerryStone, Hoyt Weeks in Treatment: 11 Active Inactive Electronic Signature(s) Signed: 11/15/2020 5:13:28 PM By: Yevonne PaxEpps, Carrie RN Entered By: Yevonne PaxEpps, Carrie on 11/15/2020 10:16:48 [name], [name] ([id number]) -------------------------------------------------------------------------------- Pain Assessment Details Patient Name: [name], [name] Date of Service: 11/15/2020 10:00 AM Medical Record Number: [id number] Patient Account Number: [id number] Date of  Birth/Sex: Jun 09, 1946 (75 y.o. M) Treating RN: Hansel FeinsteinBishop, Joy Primary Care Obi Scrima: Elizabeth SauerJones, Deanna Other Clinician: Lolita CramBurnette, Kyara Referring Casimiro Lienhard: Elizabeth SauerJones, Deanna Treating Dvon Jiles/Extender: Rowan BlaseStone, Hoyt Weeks in Treatment: 11 Active Problems Location of Pain Severity and Description of Pain Patient Has Paino No Site Locations Rate the pain. Current Pain Level: 0 Pain Management and Medication Current Pain Management: Notes none voiced Electronic Signature(s) Signed: 11/15/2020 4:36:12 PM By: Hansel FeinsteinBishop, Joy Entered By: Hansel FeinsteinBishop, Joy on 11/15/2020 10:05:39 [name], Shykeem ([id number]) -------------------------------------------------------------------------------- Patient/Caregiver Education Details Patient Name: [name], [name] Date of Service: 11/15/2020 10:00 AM Medical Record Number: [id number] Patient Account Number: [id number] Date of Birth/Gender: Jun 09, 1946 (75 y.o. M) Treating RN: Yevonne PaxEpps, Carrie Primary Care Physician: Elizabeth SauerJones, Deanna Other Clinician: Lolita CramBurnette, Kyara Referring Physician: Elizabeth SauerJones, Deanna Treating Physician/Extender: Rowan BlaseStone, Hoyt Weeks in Treatment: 11 Education Assessment Education Provided To: Patient Education Topics Provided Wound/Skin Impairment: Methods: Explain/Verbal Responses: State content correctly Electronic Signature(s) Signed: 11/15/2020 5:13:28 PM By: Yevonne PaxEpps, Carrie RN Entered By: Yevonne PaxEpps, Carrie on 11/15/2020 10:18:30 [name], [name] ([id number]) -------------------------------------------------------------------------------- Wound Assessment Details Patient Name: [name], [name] Date of Service: 11/15/2020 10:00 AM Medical Record Number: [id number] Patient Account Number: [id number] Date of Birth/Sex: Jun 09, 1946 (75 y.o. M) Treating RN: Yevonne PaxEpps, Carrie Primary Care Annaleise Burger: Elizabeth SauerJones, Deanna Other Clinician: Lolita CramBurnette, Kyara Referring Demarie Uhlig: Elizabeth SauerJones, Deanna Treating Makelle Marrone/Extender: Rowan BlaseStone, Hoyt Weeks in Treatment: 11 Wound Status Wound Number:  1 Primary Etiology: Trauma, Other Wound Location: Left, Lateral Lower Leg Wound Status: Open Wounding Event: Skin Tear/Laceration Comorbid History: Human Immunodeficiency Virus, Hypertension Date Acquired: 07/30/2020 Weeks Of Treatment: 11 Clustered Wound: No Photos Wound Measurements Length: (cm) Width: (cm) Depth: (cm) Area: (cm) Volume: (cm) 0 % Reduction in Area: 100% 0 % Reduction in Volume: 100% 0 Epithelialization: Large (67-100%) 0 Tunneling: No 0 Undermining: No Wound Description Classification: Full Thickness Without Exposed Support Structure Wound Margin: Flat and Intact Exudate Amount: None Present s Foul Odor After Cleansing: No Slough/Fibrino No Wound Bed Granulation Amount: None Present (0%) Exposed Structure Necrotic Amount: None Present (0%) Fascia Exposed: No Fat Layer (Subcutaneous Tissue) Exposed: No Tendon Exposed: No Muscle Exposed: No Joint Exposed: No Bone Exposed: No Electronic Signature(s) Signed: 11/15/2020 5:13:28 PM By: Yevonne PaxEpps, Carrie RN Entered By: Yevonne PaxEpps, Carrie on 11/15/2020 10:16:26 [name], Bartlett ([id number]) -------------------------------------------------------------------------------- Vitals Details Patient Name: [name], Raylen Date of Service: 11/15/2020 10:00 AM Medical Record Number: [id number] Patient Account Number: [id number] Date of Birth/Sex: Jun 09, 1946 (75 y.o. M) Treating RN: Hansel FeinsteinBishop, Joy Primary Care Jovoni Borkenhagen: Elizabeth SauerJones, Deanna Other Clinician: Lolita CramBurnette, Kyara Referring Captain Blucher: Elizabeth SauerJones, Deanna Treating Chaz Ronning/Extender: Allen DerryStone, Hoyt Weeks in Treatment: 11 Vital Signs Time Taken:  10:02 Temperature (F): 98.1 Height (in): 71 Pulse (bpm): 69 Weight (lbs): 205 Respiratory Rate (breaths/min): 16 Body Mass Index (BMI): 28.6 Blood Pressure (mmHg): 133/74 Reference Range: 80 - 120 mg / dl Electronic Signature(s) Signed: 11/15/2020 4:36:12 PM By: Hansel Feinstein Entered ByHansel Feinstein on 11/15/2020 10:05:24

## 2020-11-30 ENCOUNTER — Encounter: Payer: Self-pay | Admitting: Family Medicine

## 2020-11-30 ENCOUNTER — Other Ambulatory Visit: Payer: Self-pay

## 2020-11-30 ENCOUNTER — Ambulatory Visit (INDEPENDENT_AMBULATORY_CARE_PROVIDER_SITE_OTHER): Payer: Medicare Other | Admitting: Family Medicine

## 2020-11-30 VITALS — BP 130/62 | HR 64 | Ht 71.0 in | Wt 203.0 lb

## 2020-11-30 DIAGNOSIS — I10 Essential (primary) hypertension: Secondary | ICD-10-CM | POA: Diagnosis not present

## 2020-11-30 DIAGNOSIS — I4811 Longstanding persistent atrial fibrillation: Secondary | ICD-10-CM

## 2020-11-30 DIAGNOSIS — I679 Cerebrovascular disease, unspecified: Secondary | ICD-10-CM | POA: Diagnosis not present

## 2020-11-30 DIAGNOSIS — Z7901 Long term (current) use of anticoagulants: Secondary | ICD-10-CM

## 2020-11-30 DIAGNOSIS — R739 Hyperglycemia, unspecified: Secondary | ICD-10-CM

## 2020-11-30 DIAGNOSIS — E782 Mixed hyperlipidemia: Secondary | ICD-10-CM

## 2020-11-30 MED ORDER — LISINOPRIL 40 MG PO TABS: 40.0000 mg | ORAL_TABLET | Freq: Every day | ORAL | 1 refills | Status: DC

## 2020-11-30 MED ORDER — ATENOLOL 25 MG PO TABS: 25.0000 mg | ORAL_TABLET | Freq: Every day | ORAL | 1 refills | Status: DC

## 2020-11-30 MED ORDER — WARFARIN SODIUM 4 MG PO TABS: ORAL_TABLET | ORAL | 1 refills | Status: AC

## 2020-11-30 NOTE — Patient Instructions (Signed)
Diabetes Mellitus and Nutrition, Adult When you have diabetes, or diabetes mellitus, it is very important to have healthy eating habits because your blood sugar (glucose) levels are greatly affected by what you eat and drink. Eating healthy foods in the right amounts, at about the same times every day, can help you:  Control your blood glucose.  Lower your risk of heart disease.  Improve your blood pressure.  Reach or maintain a healthy weight. What can affect my meal plan? Every person with diabetes is different, and each person has different needs for a meal plan. Your health care provider may recommend that you work with a dietitian to make a meal plan that is best for you. Your meal plan may vary depending on factors such as:  The calories you need.  The medicines you take.  Your weight.  Your blood glucose, blood pressure, and cholesterol levels.  Your activity level.  Other health conditions you have, such as heart or kidney disease. How do carbohydrates affect me? Carbohydrates, also called carbs, affect your blood glucose level more than any other type of food. Eating carbs naturally raises the amount of glucose in your blood. Carb counting is a method for keeping track of how many carbs you eat. Counting carbs is important to keep your blood glucose at a healthy level, especially if you use insulin or take certain oral diabetes medicines. It is important to know how many carbs you can safely have in each meal. This is different for every person. Your dietitian can help you calculate how many carbs you should have at each meal and for each snack. How does alcohol affect me? Alcohol can cause a sudden decrease in blood glucose (hypoglycemia), especially if you use insulin or take certain oral diabetes medicines. Hypoglycemia can be a life-threatening condition. Symptoms of hypoglycemia, such as sleepiness, dizziness, and confusion, are similar to symptoms of having too much  alcohol.  Do not drink alcohol if: ? Your health care provider tells you not to drink. ? You are pregnant, may be pregnant, or are planning to become pregnant.  If you drink alcohol: ? Do not drink on an empty stomach. ? Limit how much you use to:  0-1 drink a day for women.  0-2 drinks a day for men. ? Be aware of how much alcohol is in your drink. In the [place] one drink equals one 12 oz bottle of beer (355 mL), one 5 oz glass of wine (148 mL), or one 1 oz glass of hard liquor (44 mL). ? Keep yourself hydrated with water, diet soda, or unsweetened iced tea.  Keep in mind that regular soda, juice, and other mixers may contain a lot of sugar and must be counted as carbs. What are tips for following this plan? Reading food labels  Start by checking the serving size on the "Nutrition Facts" label of packaged foods and drinks. The amount of calories, carbs, fats, and other nutrients listed on the label is based on one serving of the item. Many items contain more than one serving per package.  Check the total grams (g) of carbs in one serving. You can calculate the number of servings of carbs in one serving by dividing the total carbs by 15. For example, if a food has 30 g of total carbs per serving, it would be equal to 2 servings of carbs.  Check the number of grams (g) of saturated fats and trans fats in one serving. Choose foods that have   a low amount or none of these fats.  Check the number of milligrams (mg) of salt (sodium) in one serving. Most people should limit total sodium intake to less than 2,300 mg per day.  Always check the nutrition information of foods labeled as "low-fat" or "nonfat." These foods may be higher in added sugar or refined carbs and should be avoided.  Talk to your dietitian to identify your daily goals for nutrients listed on the label. Shopping  Avoid buying canned, pre-made, or processed foods. These foods tend to be high in fat, sodium, and added  sugar.  Shop around the outside edge of the grocery store. This is where you will most often find fresh fruits and vegetables, bulk grains, fresh meats, and fresh dairy. Cooking  Use low-heat cooking methods, such as baking, instead of high-heat cooking methods like deep frying.  Cook using healthy oils, such as olive, canola, or sunflower oil.  Avoid cooking with butter, cream, or high-fat meats. Meal planning  Eat meals and snacks regularly, preferably at the same times every day. Avoid going long periods of time without eating.  Eat foods that are high in fiber, such as fresh fruits, vegetables, beans, and whole grains. Talk with your dietitian about how many servings of carbs you can eat at each meal.  Eat 4-6 oz (112-168 g) of lean protein each day, such as lean meat, chicken, fish, eggs, or tofu. One ounce (oz) of lean protein is equal to: ? 1 oz (28 g) of meat, chicken, or fish. ? 1 egg. ?  cup (62 g) of tofu.  Eat some foods each day that contain healthy fats, such as avocado, nuts, seeds, and fish.   What foods should I eat? Fruits Berries. Apples. Oranges. Peaches. Apricots. Plums. Grapes. Mango. Papaya. Pomegranate. Kiwi. Cherries. Vegetables Lettuce. Spinach. Leafy greens, including kale, chard, collard greens, and mustard greens. Beets. Cauliflower. Cabbage. Broccoli. Carrots. Green beans. Tomatoes. Peppers. Onions. Cucumbers. Brussels sprouts. Grains Whole grains, such as whole-wheat or whole-grain bread, crackers, tortillas, cereal, and pasta. Unsweetened oatmeal. Quinoa. Brown or wild rice. Meats and other proteins Seafood. Poultry without skin. Lean cuts of poultry and beef. Tofu. Nuts. Seeds. Dairy Low-fat or fat-free dairy products such as milk, yogurt, and cheese. The items listed above may not be a complete list of foods and beverages you can eat. Contact a dietitian for more information. What foods should I avoid? Fruits Fruits canned with  syrup. Vegetables Canned vegetables. Frozen vegetables with butter or cream sauce. Grains Refined white flour and flour products such as bread, pasta, snack foods, and cereals. Avoid all processed foods. Meats and other proteins Fatty cuts of meat. Poultry with skin. Breaded or fried meats. Processed meat. Avoid saturated fats. Dairy Full-fat yogurt, cheese, or milk. Beverages Sweetened drinks, such as soda or iced tea. The items listed above may not be a complete list of foods and beverages you should avoid. Contact a dietitian for more information. Questions to ask a health care provider  Do I need to meet with a diabetes educator?  Do I need to meet with a dietitian?  What number can I call if I have questions?  When are the best times to check my blood glucose? Where to find more information:  American Diabetes Association: diabetes.org  Academy of Nutrition and Dietetics: www.eatright.org  National Institute of Diabetes and Digestive and Kidney Diseases: www.niddk.nih.gov  Association of Diabetes Care and Education Specialists: www.diabeteseducator.org Summary  It is important to have healthy eating   habits because your blood sugar (glucose) levels are greatly affected by what you eat and drink.  A healthy meal plan will help you control your blood glucose and maintain a healthy lifestyle.  Your health care provider may recommend that you work with a dietitian to make a meal plan that is best for you.  Keep in mind that carbohydrates (carbs) and alcohol have immediate effects on your blood glucose levels. It is important to count carbs and to use alcohol carefully. This information is not intended to replace advice given to you by your health care provider. Make sure you discuss any questions you have with your health care provider. Document Revised: 07/01/2019 Document Reviewed: 07/01/2019 Elsevier Patient Education  2021 [employer]  

## 2020-11-30 NOTE — Progress Notes (Signed)
Date:  11/30/2020   Name:  [name]   DOB:  May 11, 1946   MRN:  [id number]   Chief Complaint: Hyperlipidemia, Hypertension, Allergic Rhinitis , Diabetes (Diet controlled? Last A1C was 6.5), and Cerebrovascular Accident (Takes Coumadin for this)  Hyperlipidemia This is a chronic problem. The current episode started more than 1 year ago. The problem is controlled. Recent lipid tests were reviewed and are normal. He has no history of chronic renal disease, diabetes, hypothyroidism, liver disease, obesity or nephrotic syndrome. There are no known factors aggravating his hyperlipidemia. Pertinent negatives include no chest pain, focal sensory loss, focal weakness, leg pain, myalgias or shortness of breath. Current antihyperlipidemic treatment includes diet change. The current treatment provides moderate improvement of lipids. There are no compliance problems.  Risk factors for coronary artery disease include hypertension.  Hypertension This is a chronic problem. The problem has been gradually improving since onset. The problem is controlled. Associated symptoms include headaches. Pertinent negatives include no anxiety, blurred vision, chest pain, malaise/fatigue, neck pain, orthopnea, palpitations, peripheral edema, PND, shortness of breath or sweats. Risk factors for coronary artery disease include dyslipidemia. Past treatments include beta blockers and ACE inhibitors. The current treatment provides moderate improvement. There are no compliance problems.  There is no history of angina, kidney disease, CAD/MI, CVA, heart failure, left ventricular hypertrophy, PVD or retinopathy. There is no history of chronic renal disease, a hypertension causing med or renovascular disease.  Diabetes He presents for his follow-up diabetic visit. He has type 2 diabetes mellitus. His disease course has been stable. Hypoglycemia symptoms include headaches. Pertinent negatives for hypoglycemia include no dizziness,  nervousness/anxiousness or sweats. Pertinent negatives for diabetes include no blurred vision, no chest pain, no fatigue, no foot paresthesias, no foot ulcerations, no polydipsia, no polyphagia, no polyuria, no visual change, no weakness and no weight loss. Pertinent negatives for diabetic complications include no CVA, peripheral neuropathy, PVD or retinopathy. Current diabetic treatment includes diet. He is following a generally healthy diet. Meal planning includes avoidance of concentrated sweets and carbohydrate counting. He participates in exercise intermittently. His dinner blood glucose is taken between 4-5 pm. His dinner blood glucose range is generally 90-110 mg/dl.  Cerebrovascular Accident Associated symptoms include headaches. Pertinent negatives include no abdominal pain, chest pain, chills, coughing, fatigue, fever, myalgias, nausea, neck pain, rash, sore throat, visual change or weakness.    Lab Results  Component Value Date   CREATININE 1.04 06/09/2020   BUN 17 06/09/2020   NA 139 06/09/2020   K 4.5 06/09/2020   CL 99 06/09/2020   CO2 28 06/09/2020   Lab Results  Component Value Date   CHOL 190 07/12/2020   HDL 52 07/12/2020   LDLCALC 106 (H) 07/12/2020   TRIG 183 (H) 07/12/2020   CHOLHDL 4.0 10/10/2019   No results found for: TSH Lab Results  Component Value Date   HGBA1C 6.5 (H) 06/09/2020   No results found for: WBC, HGB, HCT, MCV, PLT No results found for: ALT, AST, GGT, ALKPHOS, BILITOT   Review of Systems  Constitutional: Negative for chills, fatigue, fever, malaise/fatigue and weight loss.  HENT: Negative for drooling, ear discharge, ear pain and sore throat.   Eyes: Negative for blurred vision.  Respiratory: Negative for cough, shortness of breath and wheezing.   Cardiovascular: Negative for chest pain, palpitations, orthopnea, leg swelling and PND.  Gastrointestinal: Negative for abdominal pain, blood in stool, constipation, diarrhea and nausea.   Endocrine: Negative for polydipsia, polyphagia and polyuria.  Genitourinary: Negative for dysuria, frequency, hematuria and urgency.  Musculoskeletal: Negative for back pain, myalgias and neck pain.  Skin: Negative for rash.  Allergic/Immunologic: Negative for environmental allergies.  Neurological: Positive for headaches. Negative for dizziness, focal weakness and weakness.  Hematological: Does not bruise/bleed easily.  Psychiatric/Behavioral: Negative for suicidal ideas. The patient is not nervous/anxious.     Patient Active Problem List   Diagnosis Date Noted  . Longstanding persistent atrial fibrillation (HCC) 06/09/2020  . 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 Essential hypertension 11/29/2017  . Chronic seasonal allergic rhinitis due to pollen 11/29/2017  . Cerebral vascular disease 11/29/2017  . Anticoagulated on Coumadin 02/23/2016    Allergies  Allergen Reactions  . Latex     Past Surgical History:  Procedure Laterality Date  . HERNIA REPAIR      Social History   Tobacco Use  . Smoking status: Never Smoker  . Smokeless tobacco: Never Used  Substance Use Topics  . Alcohol use: No    Alcohol/week: 0.0 standard drinks  . Drug use: No     Medication list has been reviewed and updated.  Current Meds  Medication Sig  . atenolol (TENORMIN) 25 MG tablet Take 1 tablet (25 mg total) by mouth daily.  . fluticasone (FLONASE) 50 MCG/ACT nasal spray USE 1 SPRAY IN EACH NOSTRIL DAILY  . lisinopril (ZESTRIL) 40 MG tablet Take 1 tablet (40 mg total) by mouth daily.  Marland Kitchen warfarin (COUMADIN) 1 MG tablet TAKE 1 TABLET AS DIRECTED (TAKE 4 MG ON MONDAY, WEDNESDAY, AND FRIDAY AND 3 MG THE REST OF THE WEEK)  . warfarin (COUMADIN) 3 MG tablet TAKE 1 TABLET DAILY OR AS DIRECTED  . warfarin (COUMADIN) 4 MG tablet TAKE 1 TABLET DAILY OR AS DIRECTED    PHQ 2/9 Scores 11/30/2020 08/02/2020 06/09/2020 10/28/2019  PHQ - 2 Score 0 0 0 0  PHQ- 9 Score 0  0 0 0    GAD 7 : Generalized Anxiety Score 11/30/2020 08/02/2020 06/09/2020 10/28/2019  Nervous, Anxious, on Edge 0 0 0 0  Control/stop worrying 0 0 0 0  Worry too much - different things 0 0 0 0  Trouble relaxing 0 0 0 0  Restless 0 0 0 0  Easily annoyed or irritable 0 0 0 0  Afraid - awful might happen 0 0 0 0  Total GAD 7 Score 0 0 0 0  Anxiety Difficulty - Not difficult at all - -    BP Readings from Last 3 Encounters:  11/30/20 130/62  08/25/20 (!) 160/84  08/16/20 120/70    Physical Exam Vitals and nursing note reviewed.  HENT:     Head: Normocephalic.     Right Ear: Tympanic membrane, ear canal and external ear normal.     Left Ear: Tympanic membrane, ear canal and external ear normal.     Nose: Nose normal. No congestion or rhinorrhea.  Eyes:     General: No scleral icterus.       Right eye: No discharge.        Left eye: No discharge.     Conjunctiva/sclera: Conjunctivae normal.     Pupils: Pupils are equal, round, and reactive to light.  Neck:     Thyroid: No thyromegaly.     Vascular: No JVD.     Trachea: No tracheal deviation.  Cardiovascular:     Rate and Rhythm: Normal rate and regular rhythm.     Heart sounds: Normal heart sounds. No murmur  heard. No friction rub. No gallop.   Pulmonary:     Effort: No respiratory distress.     Breath sounds: Normal breath sounds. No wheezing, rhonchi or rales.  Abdominal:     General: Bowel sounds are normal.     Palpations: Abdomen is soft. There is no mass.     Tenderness: There is no abdominal tenderness. There is no guarding or rebound.  Musculoskeletal:        General: No tenderness. Normal range of motion.     Cervical back: Normal range of motion and neck supple.  Lymphadenopathy:     Cervical: No cervical adenopathy.  Skin:    General: Skin is warm.     Findings: No rash.  Neurological:     Mental Status: He is alert and oriented to person, place, and time.     Cranial Nerves: No cranial nerve deficit.      Deep Tendon Reflexes: Reflexes are normal and symmetric.     Wt Readings from Last 3 Encounters:  11/30/20 203 lb (92.1 kg)  08/25/20 205 lb (93 kg)  08/16/20 205 lb (93 kg)    BP 130/62   Pulse 64   Ht 5\' 11"  (1.803 m)   Wt 203 lb (92.1 kg)   BMI 28.31 kg/m   Assessment and Plan: 1. Essential hypertension Chronic.  Controlled.  Stable.  Blood pressure today is 130/62.  We will continue atenolol 25 mg once a day and lisinopril 40 mg once a day.  Will check renal function panel for electrolytes and GFR. - Renal Function Panel - atenolol (TENORMIN) 25 MG tablet; Take 1 tablet (25 mg total) by mouth daily.  Dispense: 90 tablet; Refill: 1 - lisinopril (ZESTRIL) 40 MG tablet; Take 1 tablet (40 mg total) by mouth daily.  Dispense: 90 tablet; Refill: 1  2. Cerebral vascular disease Chronic.  Controlled.  Patient anticoagulated on Coumadin and will continue as dictated by his PT/INR - warfarin (COUMADIN) 4 MG tablet; TAKE 1 TABLET DAILY OR AS DIRECTED  Dispense: 90 tablet; Refill: 1  3. Anticoagulated on Coumadin As noted above - INR/PT - warfarin (COUMADIN) 4 MG tablet; TAKE 1 TABLET DAILY OR AS DIRECTED  Dispense: 90 tablet; Refill: 1  4. Longstanding persistent atrial fibrillation (HCC) Chronic.  Controlled.  Stable.  We will continue on anticoagulation for prevention of stroke. - warfarin (COUMADIN) 4 MG tablet; TAKE 1 TABLET DAILY OR AS DIRECTED  Dispense: 90 tablet; Refill: 1  5. 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  Controlled.  Stable.  Patient does not take statin agents at this time and would prefer to continue with diet control. - Lipid Panel With LDL/HDL Ratio  6. Hyperglycemia Patient has history of hyperglycemia and the last A1c was 6.5.  Patient refuses to take medication and so we will give him a diet sheet on nutrition and diabetes with hopes that this will be sufficient for control. - HgB A1c

## 2020-12-01 LAB — LIPID PANEL WITH LDL/HDL RATIO
Cholesterol, Total: 184 mg/dL (ref 100–199)
HDL: 49 mg/dL (ref >39)
LDL Chol Calc (NIH): 113 mg/dL — ABNORMAL HIGH (ref 0–99)
LDL/HDL Ratio: 2.3 ratio (ref 0.0–3.6)
Triglycerides: 125 mg/dL (ref 0–149)
VLDL Cholesterol Cal: 22 mg/dL (ref 5–40)

## 2020-12-01 LAB — RENAL FUNCTION PANEL
Albumin: 4.1 g/dL (ref 3.7–4.7)
BUN/Creatinine Ratio: 16 (ref 10–24)
BUN: 19 mg/dL (ref 8–27)
CO2: 28 mmol/L (ref 20–29)
Calcium: 9.4 mg/dL (ref 8.6–10.2)
Chloride: 102 mmol/L (ref 96–106)
Creatinine, Ser: 1.16 mg/dL (ref 0.76–1.27)
Glucose: 128 mg/dL — ABNORMAL HIGH (ref 65–99)
Phosphorus: 3 mg/dL (ref 2.8–4.1)
Potassium: 4.4 mmol/L (ref 3.5–5.2)
Sodium: 143 mmol/L (ref 134–144)
eGFR: 67 mL/min/{1.73_m2} (ref >59)

## 2020-12-01 LAB — HEMOGLOBIN A1C
Est. average glucose Bld gHb Est-mCnc: 134 mg/dL
Hgb A1c MFr Bld: 6.3 % — ABNORMAL HIGH (ref 4.8–5.6)

## 2020-12-01 LAB — PROTIME-INR
INR: 1.3 — ABNORMAL HIGH (ref 0.9–1.2)
Prothrombin Time: 13.5 s — ABNORMAL HIGH (ref 9.1–12.0)

## 2020-12-07 ENCOUNTER — Telehealth: Payer: Self-pay

## 2020-12-07 NOTE — Telephone Encounter (Signed)
Copied from CRM [id number]. Topic: General - Other >> Dec 07, 2020  9:20 AM Tamela Oddi wrote: Reason for CRM: Patient is returning  a call to Delice Bison regarding a coumadin test.  Please call at [phone]

## 2020-12-07 NOTE — Telephone Encounter (Signed)
Called and pt is coming in tomorrow- please put on schedule for tomorrow lab only

## 2020-12-08 ENCOUNTER — Other Ambulatory Visit: Payer: Self-pay | Admitting: Family Medicine

## 2020-12-08 ENCOUNTER — Other Ambulatory Visit: Payer: Medicare Other

## 2020-12-08 DIAGNOSIS — Z7901 Long term (current) use of anticoagulants: Secondary | ICD-10-CM | POA: Diagnosis not present

## 2020-12-08 NOTE — Progress Notes (Signed)
PT/INR lab only

## 2020-12-09 LAB — PROTIME-INR
INR: 1.4 — ABNORMAL HIGH (ref 0.9–1.2)
Prothrombin Time: 14.9 s — ABNORMAL HIGH (ref 9.1–12.0)

## 2021-01-11 ENCOUNTER — Other Ambulatory Visit: Payer: Self-pay

## 2021-01-11 ENCOUNTER — Other Ambulatory Visit: Payer: Medicare Other

## 2021-01-11 DIAGNOSIS — Z7901 Long term (current) use of anticoagulants: Secondary | ICD-10-CM

## 2021-01-12 LAB — PROTIME-INR
INR: 1.9 — ABNORMAL HIGH (ref 0.9–1.2)
Prothrombin Time: 19.6 s — ABNORMAL HIGH (ref 9.1–12.0)

## 2021-02-02 ENCOUNTER — Other Ambulatory Visit: Payer: Medicare Other

## 2021-02-02 ENCOUNTER — Other Ambulatory Visit: Payer: Self-pay

## 2021-02-02 ENCOUNTER — Telehealth: Payer: Self-pay

## 2021-02-02 DIAGNOSIS — Z7901 Long term (current) use of anticoagulants: Secondary | ICD-10-CM

## 2021-02-02 NOTE — Telephone Encounter (Unsigned)
Copied from CRM [id number]. Topic: General - Other >> Feb 02, 2021  8:55 AM Gaetana Michaelis A wrote: Reason for CRM: Patient has made contact to request orders for testing related to Coumadin (patient was uncertain of the specific orders needed or the name of the test)  Patient would like to be notified when orders have been submitted  Please contact to further advise if needed

## 2021-02-03 LAB — PROTIME-INR
INR: 2.2 — ABNORMAL HIGH (ref 0.9–1.2)
Prothrombin Time: 22 s — ABNORMAL HIGH (ref 9.1–12.0)

## 2021-03-24 ENCOUNTER — Telehealth: Payer: Self-pay

## 2021-03-24 NOTE — Telephone Encounter (Signed)
Copied from CRM [id number]. Topic: General - Other >> Mar 24, 2021  8:38 AM Gaetana Michaelis A wrote: Reason for CRM: Patient has called to request "orders for testing blood for blood thinners"  Please contact further when possible

## 2021-03-25 ENCOUNTER — Ambulatory Visit: Payer: Medicare Other

## 2021-03-25 DIAGNOSIS — Z7901 Long term (current) use of anticoagulants: Secondary | ICD-10-CM

## 2021-03-26 LAB — PROTIME-INR
INR: 1.8 — ABNORMAL HIGH (ref 0.9–1.2)
Prothrombin Time: 18.8 s — ABNORMAL HIGH (ref 9.1–12.0)

## 2021-03-28 ENCOUNTER — Telehealth: Payer: Self-pay | Admitting: Family Medicine

## 2021-03-28 NOTE — Telephone Encounter (Signed)
[name], Pts daughter in law is calling for [name] to let her know that she received [name]'s message and she will contact the pt. [name]

## 2021-04-12 ENCOUNTER — Telehealth: Payer: Self-pay

## 2021-04-12 ENCOUNTER — Other Ambulatory Visit: Payer: Self-pay

## 2021-04-12 DIAGNOSIS — Z7901 Long term (current) use of anticoagulants: Secondary | ICD-10-CM

## 2021-04-12 NOTE — Telephone Encounter (Signed)
Copied from CRM [id number]. Topic: General - Other >> Apr 12, 2021 11:04 AM Gwenlyn Fudge wrote: Reason for CRM: Pt called and is requesting to have lab orders placed to check to see if he is need of blood thinners. Pt is requesting to have these picked up tomorrow morning. Please advise.

## 2021-04-13 DIAGNOSIS — Z7901 Long term (current) use of anticoagulants: Secondary | ICD-10-CM | POA: Diagnosis not present

## 2021-04-14 LAB — PROTIME-INR
INR: 2.2 — ABNORMAL HIGH (ref 0.9–1.2)
Prothrombin Time: 21.8 s — ABNORMAL HIGH (ref 9.1–12.0)

## 2021-04-16 ENCOUNTER — Telehealth: Payer: Self-pay

## 2021-04-16 NOTE — Telephone Encounter (Signed)
LVM advising patient to contact office during Office hours to get his initial AWV scheduled with PCP.

## 2021-05-30 ENCOUNTER — Other Ambulatory Visit: Payer: Self-pay | Admitting: Family Medicine

## 2021-05-30 DIAGNOSIS — I10 Essential (primary) hypertension: Secondary | ICD-10-CM

## 2021-06-01 ENCOUNTER — Ambulatory Visit (INDEPENDENT_AMBULATORY_CARE_PROVIDER_SITE_OTHER): Payer: Medicare Other | Admitting: Family Medicine

## 2021-06-01 ENCOUNTER — Other Ambulatory Visit: Payer: Self-pay

## 2021-06-01 ENCOUNTER — Encounter: Payer: Self-pay | Admitting: Family Medicine

## 2021-06-01 VITALS — BP 142/86 | HR 70 | Ht 71.0 in | Wt 214.0 lb

## 2021-06-01 DIAGNOSIS — J301 Allergic rhinitis due to pollen: Secondary | ICD-10-CM

## 2021-06-01 DIAGNOSIS — Z7901 Long term (current) use of anticoagulants: Secondary | ICD-10-CM

## 2021-06-01 DIAGNOSIS — I10 Essential (primary) hypertension: Secondary | ICD-10-CM | POA: Diagnosis not present

## 2021-06-01 DIAGNOSIS — R739 Hyperglycemia, unspecified: Secondary | ICD-10-CM

## 2021-06-01 DIAGNOSIS — E782 Mixed hyperlipidemia: Secondary | ICD-10-CM | POA: Diagnosis not present

## 2021-06-01 DIAGNOSIS — Z23 Encounter for immunization: Secondary | ICD-10-CM

## 2021-06-01 MED ORDER — FLUTICASONE PROPIONATE 50 MCG/ACT NA SUSP: 1.0000 | Freq: Every day | NASAL | 5 refills | Status: AC

## 2021-06-01 MED ORDER — LISINOPRIL 40 MG PO TABS: 40.0000 mg | ORAL_TABLET | Freq: Every day | ORAL | 1 refills | Status: AC

## 2021-06-01 MED ORDER — ATENOLOL 25 MG PO TABS: 25.0000 mg | ORAL_TABLET | Freq: Every day | ORAL | 1 refills | Status: AC

## 2021-06-01 NOTE — Progress Notes (Signed)
Date:  06/01/2021   Name:  [name]   DOB:  October 05, 1945   MRN:  [id number]   Chief Complaint: Prediabetes, Allergic Rhinitis , Hypertension, pneumonia vaccine (Pneum 20), and Cerebrovascular Accident (Takes coumadin for this)  Hypertension This is a chronic problem. The current episode started more than 1 year ago. The problem has been gradually improving since onset. The problem is controlled. Pertinent negatives include no anxiety, blurred vision, chest pain, headaches, malaise/fatigue, neck pain, orthopnea, palpitations, peripheral edema, PND, shortness of breath or sweats. There are no associated agents to hypertension. Past treatments include ACE inhibitors and beta blockers. The current treatment provides moderate improvement. There are no compliance problems.  There is no history of angina, kidney disease, CAD/MI, CVA, heart failure, left ventricular hypertrophy, PVD or retinopathy. There is no history of chronic renal disease, a hypertension causing med or renovascular disease.  Diabetes He presents for his follow-up (for prediabetes) diabetic visit. He has type 2 diabetes mellitus. His disease course has been stable. Pertinent negatives for hypoglycemia include no headaches or sweats. Pertinent negatives for diabetes include no blurred vision, no chest pain, no polydipsia and no polyuria. Symptoms are stable. Pertinent negatives for diabetic complications include no CVA, PVD or retinopathy.   Lab Results  Component Value Date   CREATININE 1.16 11/30/2020   BUN 19 11/30/2020   NA 143 11/30/2020   K 4.4 11/30/2020   CL 102 11/30/2020   CO2 28 11/30/2020   Lab Results  Component Value Date   CHOL 184 11/30/2020   HDL 49 11/30/2020   LDLCALC 113 (H) 11/30/2020   TRIG 125 11/30/2020   CHOLHDL 4.0 10/10/2019   No results found for: TSH Lab Results  Component Value Date   HGBA1C 6.3 (H) 11/30/2020   No results found for: WBC, HGB, HCT, MCV, PLT No results found for: ALT,  AST, GGT, ALKPHOS, BILITOT   Review of Systems  Constitutional:  Negative for malaise/fatigue.  Eyes:  Negative for blurred vision.  Respiratory:  Negative for shortness of breath.   Cardiovascular:  Negative for chest pain, palpitations, orthopnea and PND.  Endocrine: Negative for polydipsia and polyuria.  Musculoskeletal:  Negative for neck pain.  Neurological:  Negative for headaches.   Patient Active Problem List   Diagnosis Date Noted   Longstanding persistent atrial fibrillation (HCC) 06/09/2020   Moderate mixed hyperlipidemia not requiring statin therapy 06/09/2020   Essential hypertension 11/29/2017   Chronic seasonal allergic rhinitis due to pollen 11/29/2017   Cerebral vascular disease 11/29/2017   Anticoagulated on Coumadin 02/23/2016    Allergies  Allergen Reactions   Latex     Past Surgical History:  Procedure Laterality Date   HERNIA REPAIR      Social History   Tobacco Use   Smoking status: Never   Smokeless tobacco: Never  Substance Use Topics   Alcohol use: No    Alcohol/week: 0.0 standard drinks   Drug use: No     Medication list has been reviewed and updated.  Current Meds  Medication Sig   atenolol (TENORMIN) 25 MG tablet TAKE 1 TABLET DAILY   fluticasone (FLONASE) 50 MCG/ACT nasal spray USE 1 SPRAY IN EACH NOSTRIL DAILY   lisinopril (ZESTRIL) 40 MG tablet TAKE 1 TABLET DAILY   warfarin (COUMADIN) 1 MG tablet TAKE 1 TABLET AS DIRECTED (TAKE 4 MG ON MONDAY, WEDNESDAY, AND FRIDAY AND 3 MG THE REST OF THE WEEK)   warfarin (COUMADIN) 3 MG tablet TAKE 1 TABLET DAILY  OR AS DIRECTED   warfarin (COUMADIN) 4 MG tablet TAKE 1 TABLET DAILY OR AS DIRECTED   [DISCONTINUED] rosuvastatin (CRESTOR) 10 MG tablet TAKE 1 TABLET (10 MG TOTAL) BY MOUTH ONCE A WEEK.    PHQ 2/9 Scores 06/01/2021 11/30/2020 08/02/2020 06/09/2020  PHQ - 2 Score 0 0 0 0  PHQ- 9 Score 0 0 0 0    GAD 7 : Generalized Anxiety Score 06/01/2021 11/30/2020 08/02/2020 06/09/2020  Nervous,  Anxious, on Edge 0 0 0 0  Control/stop worrying 0 0 0 0  Worry too much - different things 0 0 0 0  Trouble relaxing 0 0 0 0  Restless 0 0 0 0  Easily annoyed or irritable 0 0 0 0  Afraid - awful might happen 0 0 0 0  Total GAD 7 Score 0 0 0 0  Anxiety Difficulty - - Not difficult at all -    BP Readings from Last 3 Encounters:  06/01/21 (!) 142/86  11/30/20 130/62  08/25/20 (!) 160/84    Physical Exam  Wt Readings from Last 3 Encounters:  06/01/21 214 lb (97.1 kg)  11/30/20 203 lb (92.1 kg)  08/25/20 205 lb (93 kg)    BP (!) 142/86   Pulse 70   Ht 5\' 11"  (1.803 m)   Wt 214 lb (97.1 kg)   BMI 29.85 kg/m   Assessment and Plan:  1. Essential hypertension Chronic.  Controlled.  Stable.  Blood pressure today is 142/86.  Continue atenolol 25 mg once a day, lisinopril 40 mg once a day.  Will check renal function panel for electrolytes and GFR. - atenolol (TENORMIN) 25 MG tablet; Take 1 tablet (25 mg total) by mouth daily.  Dispense: 90 tablet; Refill: 1 - lisinopril (ZESTRIL) 40 MG tablet; Take 1 tablet (40 mg total) by mouth daily.  Dispense: 90 tablet; Refill: 1 - Renal Function Panel  2. Chronic seasonal allergic rhinitis due to pollen Chronic.  Controlled.  Stable.  Continue Flonase nasal spray 1 spray both nostrils daily. - fluticasone (FLONASE) 50 MCG/ACT nasal spray; Place 1 spray into both nostrils daily.  Dispense: 16 g; Refill: 5  3. Need for pneumococcal vaccination Discussed with patient and administered. - Pneumococcal conjugate vaccine 20-valent (Prevnar 20)  4. Hyperglycemia Patient was in prediabetic range with A1c last evaluation we will recheck A1c to see if remaining in prediabetic range. - HgB A1c  5. 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  Controlled.  Stable.  Patient currently on dietary approach to reduction of cholesterol.  We will check lipid panel for current stability. - Lipid Panel With LDL/HDL Ratio  6.  Anticoagulated on Coumadin Chronic.  Controlled.  Stable and currently on Coumadin will continue pending dosing by PT/INR monitoring. - INR/PT

## 2021-06-02 LAB — RENAL FUNCTION PANEL
Albumin: 4.1 g/dL (ref 3.7–4.7)
BUN/Creatinine Ratio: 17 (ref 10–24)
BUN: 19 mg/dL (ref 8–27)
CO2: 26 mmol/L (ref 20–29)
Calcium: 9.9 mg/dL (ref 8.6–10.2)
Chloride: 101 mmol/L (ref 96–106)
Creatinine, Ser: 1.14 mg/dL (ref 0.76–1.27)
Glucose: 151 mg/dL — ABNORMAL HIGH (ref 70–99)
Phosphorus: 3.2 mg/dL (ref 2.8–4.1)
Potassium: 4.6 mmol/L (ref 3.5–5.2)
Sodium: 142 mmol/L (ref 134–144)
eGFR: 67 mL/min/{1.73_m2} (ref >59)

## 2021-06-02 LAB — PROTIME-INR
INR: 2.3 — ABNORMAL HIGH (ref 0.9–1.2)
Prothrombin Time: 23.2 s — ABNORMAL HIGH (ref 9.1–12.0)

## 2021-06-02 LAB — LIPID PANEL WITH LDL/HDL RATIO
Cholesterol, Total: 198 mg/dL (ref 100–199)
HDL: 49 mg/dL (ref >39)
LDL Chol Calc (NIH): 122 mg/dL — ABNORMAL HIGH (ref 0–99)
LDL/HDL Ratio: 2.5 ratio (ref 0.0–3.6)
Triglycerides: 154 mg/dL — ABNORMAL HIGH (ref 0–149)
VLDL Cholesterol Cal: 27 mg/dL (ref 5–40)

## 2021-06-02 LAB — HEMOGLOBIN A1C
Est. average glucose Bld gHb Est-mCnc: 151 mg/dL
Hgb A1c MFr Bld: 6.9 % — ABNORMAL HIGH (ref 4.8–5.6)

## 2021-06-10 ENCOUNTER — Other Ambulatory Visit: Payer: Self-pay

## 2021-06-10 ENCOUNTER — Encounter: Payer: Self-pay | Admitting: Emergency Medicine

## 2021-06-10 ENCOUNTER — Emergency Department: Payer: Medicare Other

## 2021-06-10 ENCOUNTER — Emergency Department
Admission: EM | Admit: 2021-06-10 | Discharge: 2021-06-10 | Disposition: A | Discharge status: Home / Self Care | Payer: Medicare Other | Source: Home / Self Care | Attending: Emergency Medicine | Admitting: Emergency Medicine

## 2021-06-10 DIAGNOSIS — I10 Essential (primary) hypertension: Secondary | ICD-10-CM | POA: Insufficient documentation

## 2021-06-10 DIAGNOSIS — M4802 Spinal stenosis, cervical region: Secondary | ICD-10-CM | POA: Diagnosis not present

## 2021-06-10 DIAGNOSIS — M2578 Osteophyte, vertebrae: Secondary | ICD-10-CM | POA: Diagnosis not present

## 2021-06-10 DIAGNOSIS — W010XXA Fall on same level from slipping, tripping and stumbling without subsequent striking against object, initial encounter: Secondary | ICD-10-CM | POA: Insufficient documentation

## 2021-06-10 DIAGNOSIS — S32010A Wedge compression fracture of first lumbar vertebra, initial encounter for closed fracture: Secondary | ICD-10-CM | POA: Diagnosis not present

## 2021-06-10 DIAGNOSIS — M532X6 Spinal instabilities, lumbar region: Secondary | ICD-10-CM | POA: Diagnosis not present

## 2021-06-10 DIAGNOSIS — Z7901 Long term (current) use of anticoagulants: Secondary | ICD-10-CM | POA: Insufficient documentation

## 2021-06-10 DIAGNOSIS — R42 Dizziness and giddiness: Secondary | ICD-10-CM | POA: Diagnosis not present

## 2021-06-10 DIAGNOSIS — R52 Pain, unspecified: Secondary | ICD-10-CM

## 2021-06-10 DIAGNOSIS — Z79899 Other long term (current) drug therapy: Secondary | ICD-10-CM | POA: Insufficient documentation

## 2021-06-10 DIAGNOSIS — R739 Hyperglycemia, unspecified: Secondary | ICD-10-CM | POA: Diagnosis not present

## 2021-06-10 DIAGNOSIS — I959 Hypotension, unspecified: Secondary | ICD-10-CM | POA: Diagnosis not present

## 2021-06-10 DIAGNOSIS — S3992XA Unspecified injury of lower back, initial encounter: Secondary | ICD-10-CM | POA: Insufficient documentation

## 2021-06-10 DIAGNOSIS — E785 Hyperlipidemia, unspecified: Secondary | ICD-10-CM | POA: Diagnosis not present

## 2021-06-10 DIAGNOSIS — Z515 Encounter for palliative care: Secondary | ICD-10-CM | POA: Diagnosis not present

## 2021-06-10 DIAGNOSIS — S32018A Other fracture of first lumbar vertebra, initial encounter for closed fracture: Secondary | ICD-10-CM | POA: Diagnosis not present

## 2021-06-10 DIAGNOSIS — W19XXXA Unspecified fall, initial encounter: Secondary | ICD-10-CM

## 2021-06-10 DIAGNOSIS — S32009A Unspecified fracture of unspecified lumbar vertebra, initial encounter for closed fracture: Secondary | ICD-10-CM | POA: Diagnosis not present

## 2021-06-10 DIAGNOSIS — Z043 Encounter for examination and observation following other accident: Secondary | ICD-10-CM | POA: Diagnosis not present

## 2021-06-10 DIAGNOSIS — Z20822 Contact with and (suspected) exposure to covid-19: Secondary | ICD-10-CM | POA: Diagnosis not present

## 2021-06-10 DIAGNOSIS — Z66 Do not resuscitate: Secondary | ICD-10-CM | POA: Diagnosis not present

## 2021-06-10 DIAGNOSIS — R55 Syncope and collapse: Secondary | ICD-10-CM | POA: Diagnosis not present

## 2021-06-10 DIAGNOSIS — J9811 Atelectasis: Secondary | ICD-10-CM | POA: Diagnosis not present

## 2021-06-10 DIAGNOSIS — S199XXA Unspecified injury of neck, initial encounter: Secondary | ICD-10-CM | POA: Insufficient documentation

## 2021-06-10 DIAGNOSIS — R531 Weakness: Secondary | ICD-10-CM | POA: Diagnosis not present

## 2021-06-10 DIAGNOSIS — S0083XA Contusion of other part of head, initial encounter: Secondary | ICD-10-CM | POA: Insufficient documentation

## 2021-06-10 DIAGNOSIS — R0902 Hypoxemia: Secondary | ICD-10-CM | POA: Diagnosis not present

## 2021-06-10 DIAGNOSIS — S140XXA Concussion and edema of cervical spinal cord, initial encounter: Secondary | ICD-10-CM | POA: Diagnosis not present

## 2021-06-10 DIAGNOSIS — M542 Cervicalgia: Secondary | ICD-10-CM | POA: Diagnosis not present

## 2021-06-10 DIAGNOSIS — T794XXA Traumatic shock, initial encounter: Secondary | ICD-10-CM | POA: Diagnosis not present

## 2021-06-10 DIAGNOSIS — M545 Low back pain, unspecified: Secondary | ICD-10-CM | POA: Diagnosis not present

## 2021-06-10 LAB — COMPREHENSIVE METABOLIC PANEL
ALT: 44 U/L (ref 0–44)
AST: 59 U/L — ABNORMAL HIGH (ref 15–41)
Albumin: 3.5 g/dL (ref 3.5–5.0)
Alkaline Phosphatase: 113 U/L (ref 38–126)
Anion gap: 7 (ref 5–15)
BUN: 22 mg/dL (ref 8–23)
CO2: 28 mmol/L (ref 22–32)
Calcium: 8.7 mg/dL — ABNORMAL LOW (ref 8.9–10.3)
Chloride: 103 mmol/L (ref 98–111)
Creatinine, Ser: 1.16 mg/dL (ref 0.61–1.24)
GFR, Estimated: >60 mL/min (ref >60)
Glucose, Bld: 232 mg/dL — ABNORMAL HIGH (ref 70–99)
Potassium: 4 mmol/L (ref 3.5–5.1)
Sodium: 138 mmol/L (ref 135–145)
Total Bilirubin: 1.2 mg/dL (ref 0.3–1.2)
Total Protein: 7.5 g/dL (ref 6.5–8.1)

## 2021-06-10 LAB — CBC WITH DIFFERENTIAL/PLATELET
Abs Immature Granulocytes: 0.13 10*3/uL — ABNORMAL HIGH (ref 0.00–0.07)
Basophils Absolute: 0.1 10*3/uL (ref 0.0–0.1)
Basophils Relative: 0 %
Eosinophils Absolute: 0 10*3/uL (ref 0.0–0.5)
Eosinophils Relative: 0 %
HCT: 46.7 % (ref 39.0–52.0)
Hemoglobin: 15.7 g/dL (ref 13.0–17.0)
Immature Granulocytes: 1 %
Lymphocytes Relative: 9 %
Lymphs Abs: 1.3 10*3/uL (ref 0.7–4.0)
MCH: 31.8 pg (ref 26.0–34.0)
MCHC: 33.6 g/dL (ref 30.0–36.0)
MCV: 94.7 fL (ref 80.0–100.0)
Monocytes Absolute: 1.2 10*3/uL — ABNORMAL HIGH (ref 0.1–1.0)
Monocytes Relative: 8 %
Neutro Abs: 12 10*3/uL — ABNORMAL HIGH (ref 1.7–7.7)
Neutrophils Relative %: 82 %
Platelets: 178 10*3/uL (ref 150–400)
RBC: 4.93 MIL/uL (ref 4.22–5.81)
RDW: 13.4 % (ref 11.5–15.5)
WBC: 14.7 10*3/uL — ABNORMAL HIGH (ref 4.0–10.5)
nRBC: 0 % (ref 0.0–0.2)

## 2021-06-10 LAB — TROPONIN I (HIGH SENSITIVITY)
Troponin I (High Sensitivity): 10 ng/L (ref <18)
Troponin I (High Sensitivity): 7 ng/L (ref <18)

## 2021-06-10 IMAGING — CR DG CHEST 2V
2 series · 2 of 2 positions shown · non-contrast
Comparison: None.

CLINICAL DATA: dizziness, weakness

EXAM:
CHEST - 2 VIEW

[chest lat]
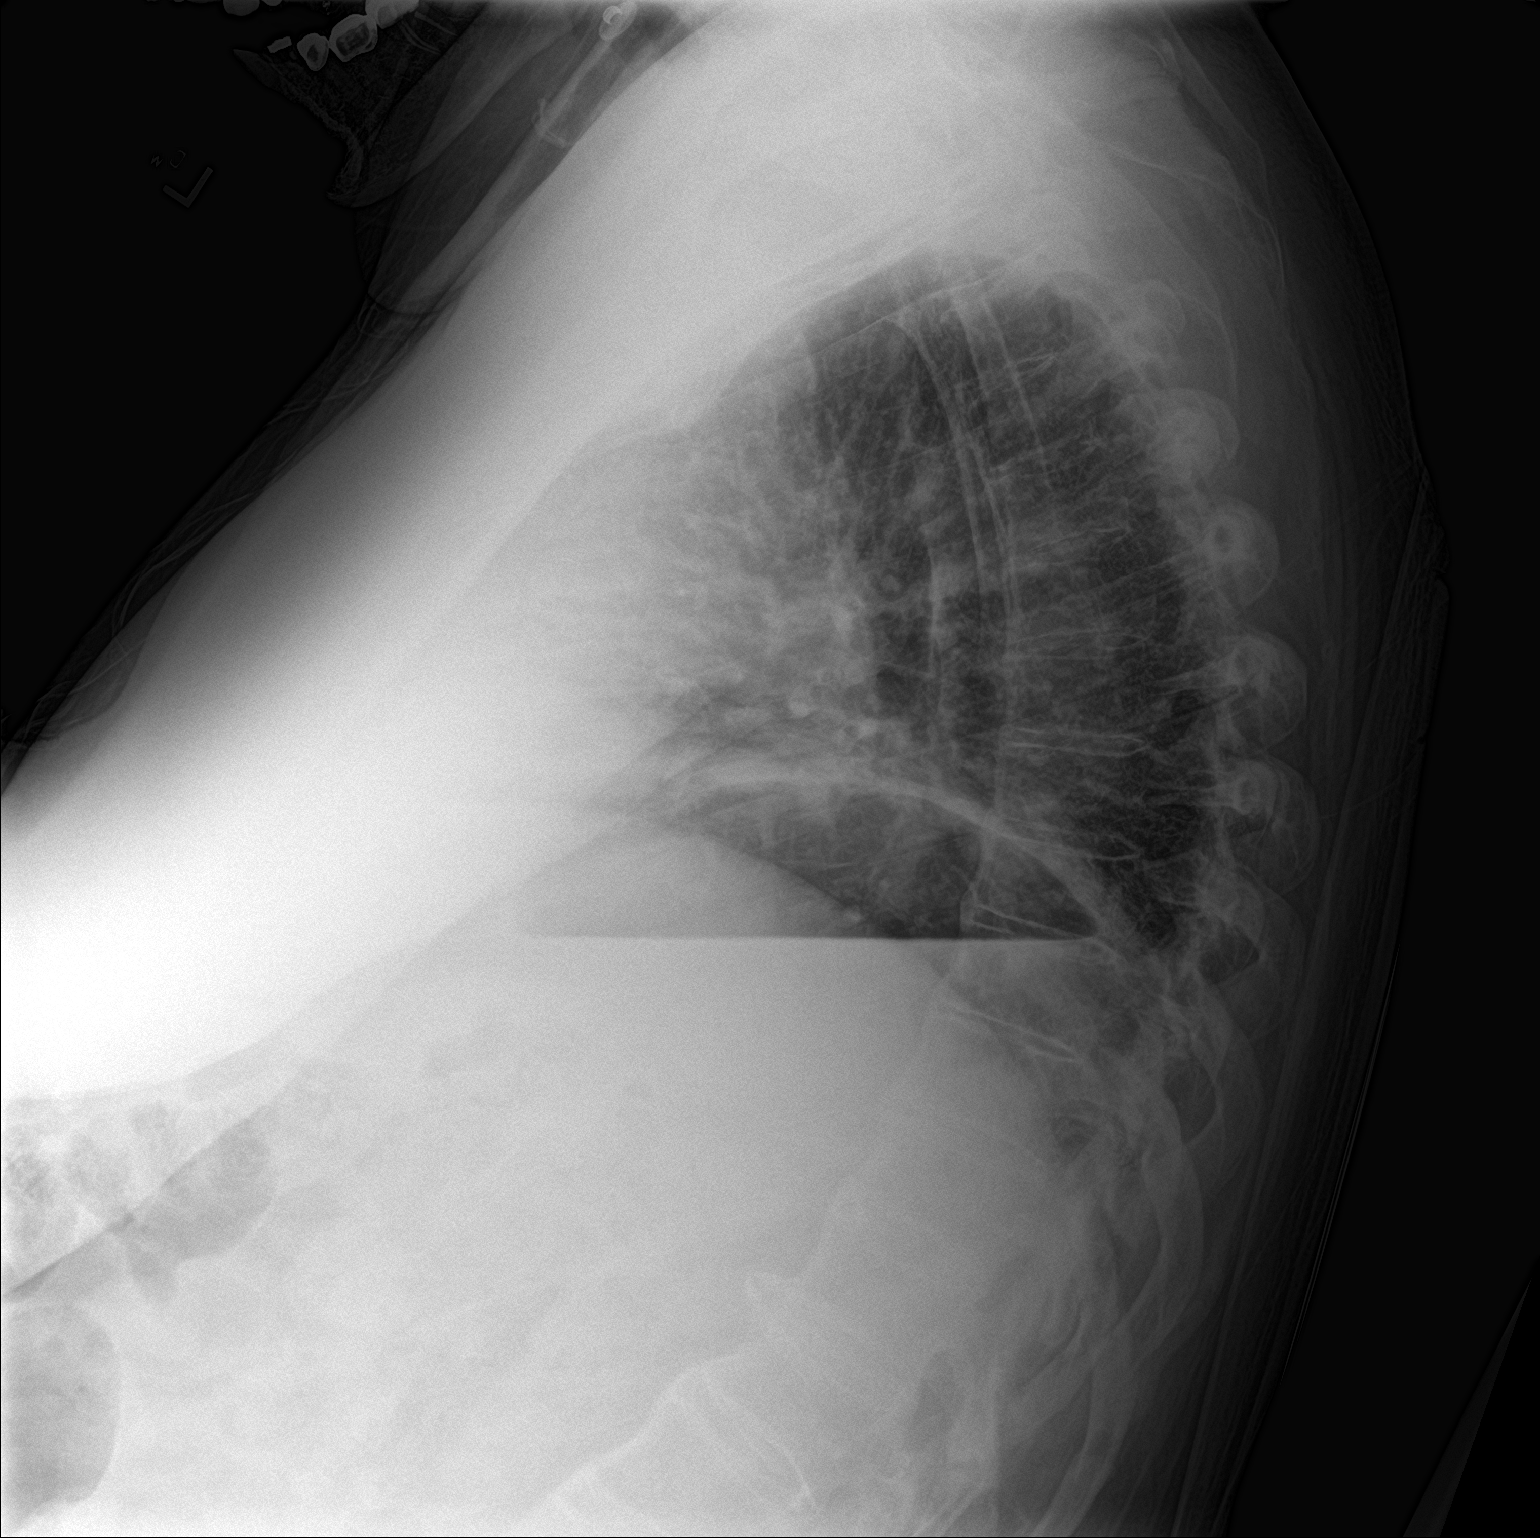

[chest ap]
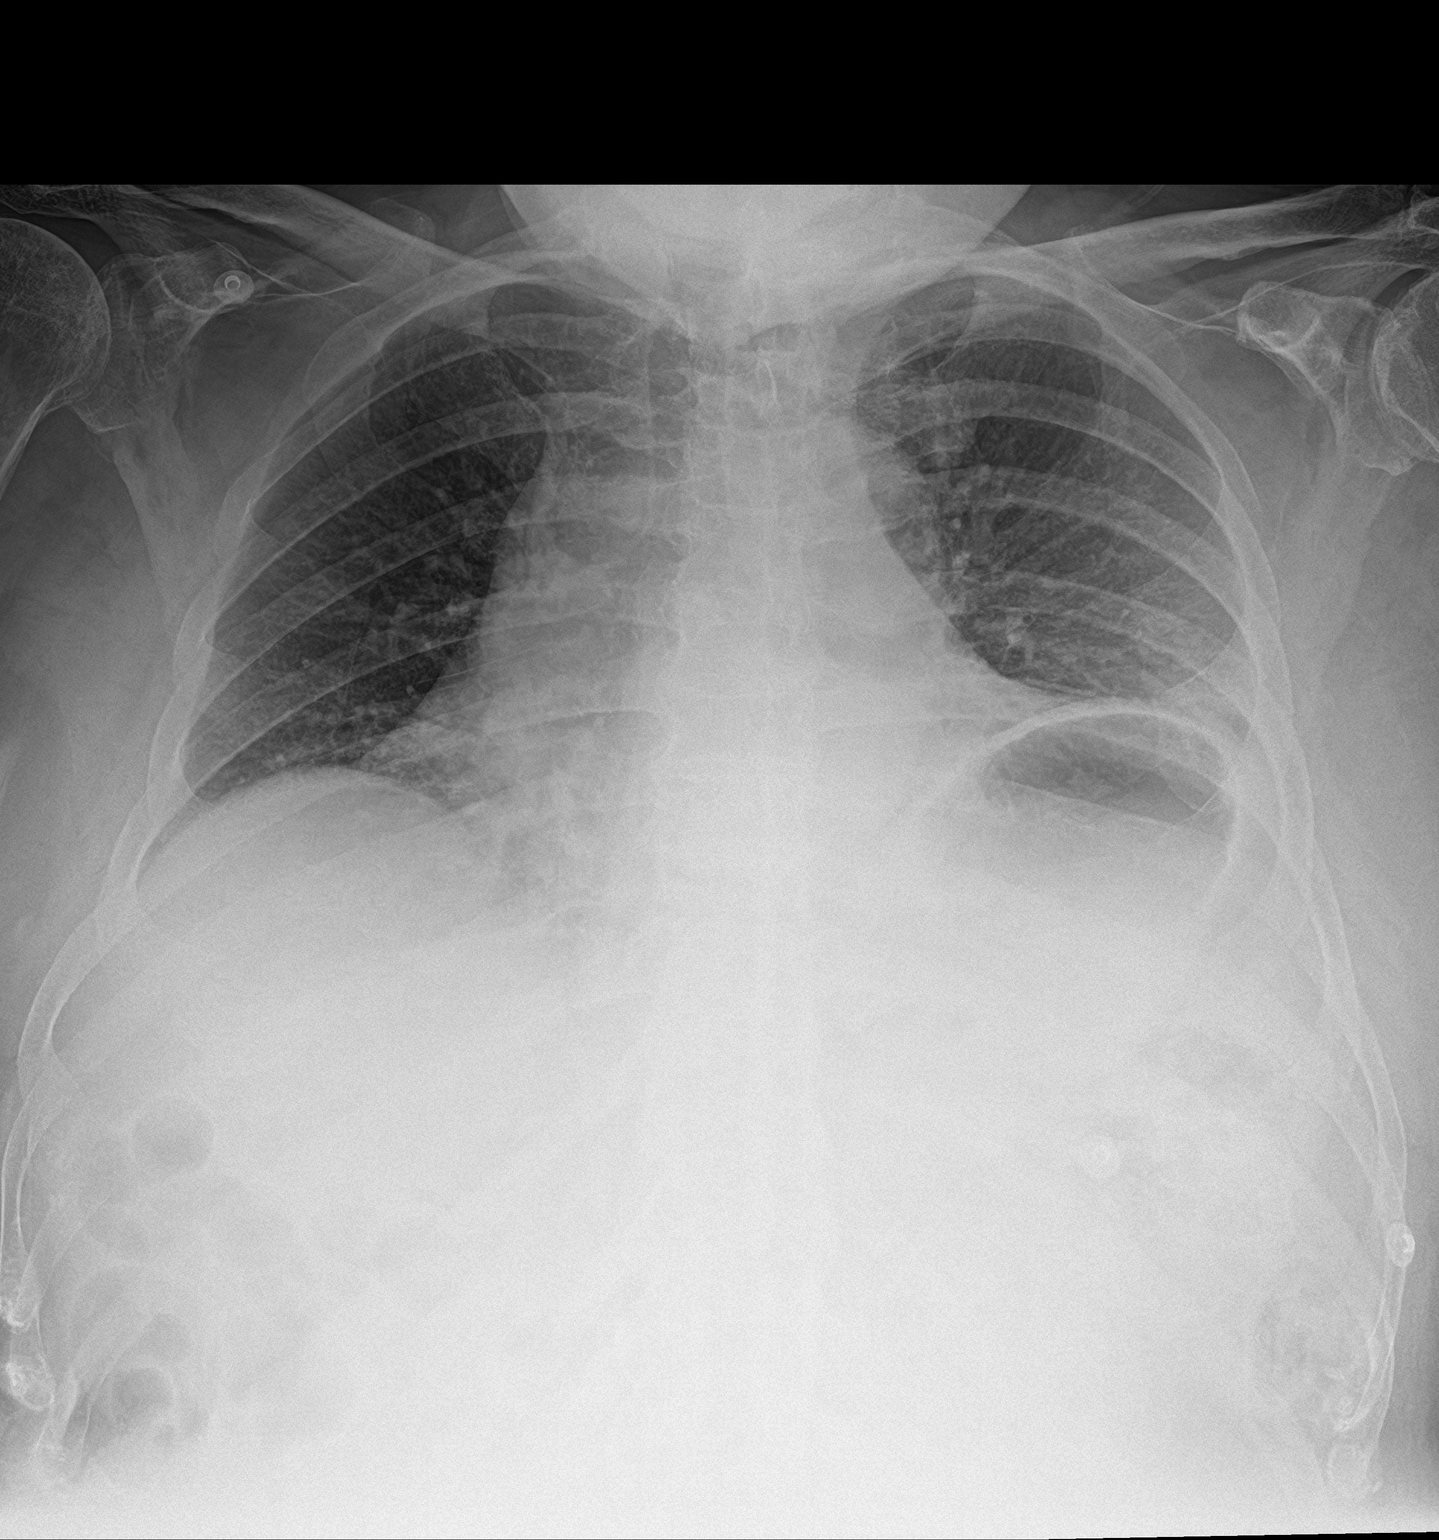

[2 of 2 positions shown; findings below may reference images not displayed]

FINDINGS: The cardiomediastinal silhouette is mildly enlarged in contour.Low
lung volumes. No pleural effusion. No pneumothorax. Bibasilar
platelike opacities most consistent with atelectasis. Visualized
abdomen is unremarkable. Wedging at the thoracolumbar junction, age
indeterminate.
IMPRESSION: 1. Low lung volume film with bibasilar atelectasis.
2. Mild wedging at the thoracolumbar junction, age indeterminate.
Recommend correlation with point tenderness.

## 2021-06-10 IMAGING — CT CT HEAD W/O CM
3 series · 16 of 47 positions shown, 19 images · non-contrast
Comparison: None.

CLINICAL DATA: Fall, head trauma

EXAM:
CT HEAD WITHOUT CONTRAST
TECHNIQUE: Contiguous axial images were obtained from the base of the skull
through the vertex without intravenous contrast.

[Series 3: head wo · axial · 0.46mm/px · z∈[+1057,+1182]mm · 10 of 31 slices shown, 13 images]
[im 3/31  brain]
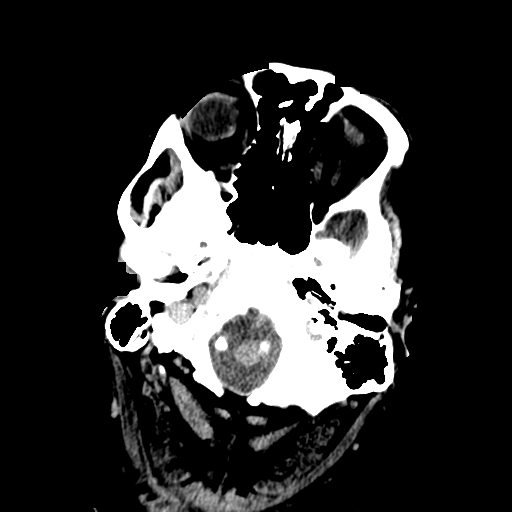
[im 3/31  bone]
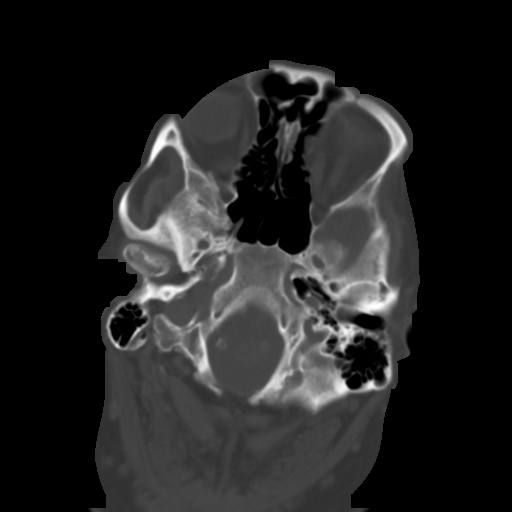
[im 6/31  brain]
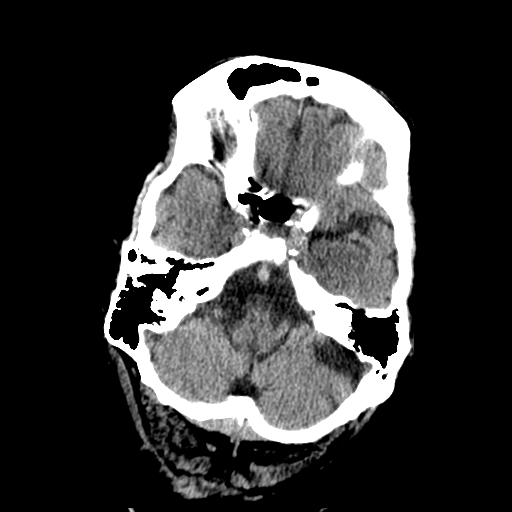
[im 9/31  brain]
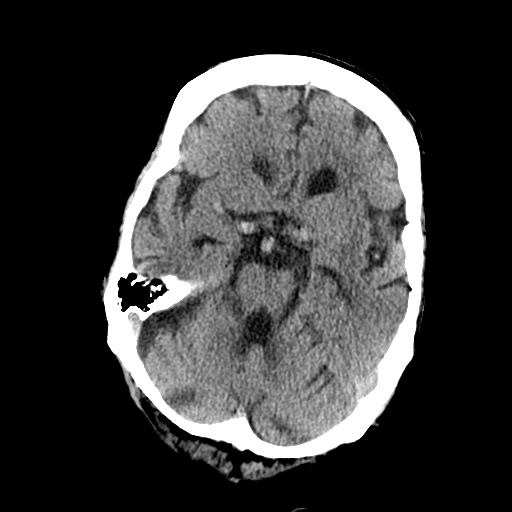
[im 11/31  brain]
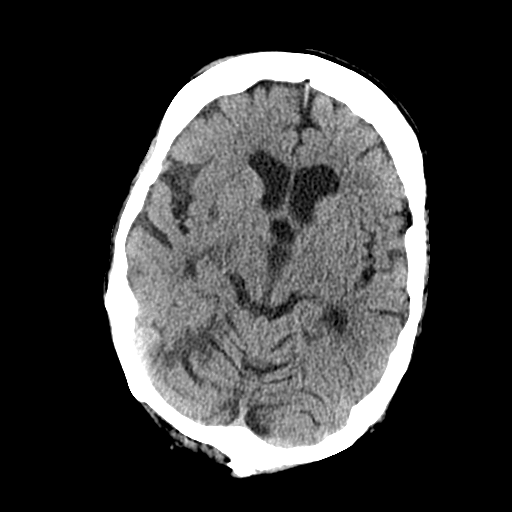
[im 14/31  brain]
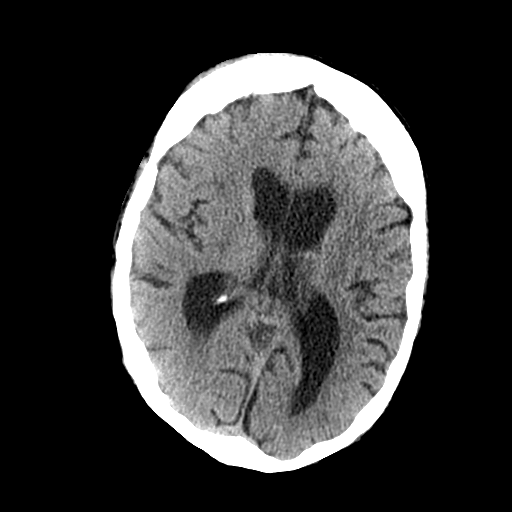
[im 14/31  bone]
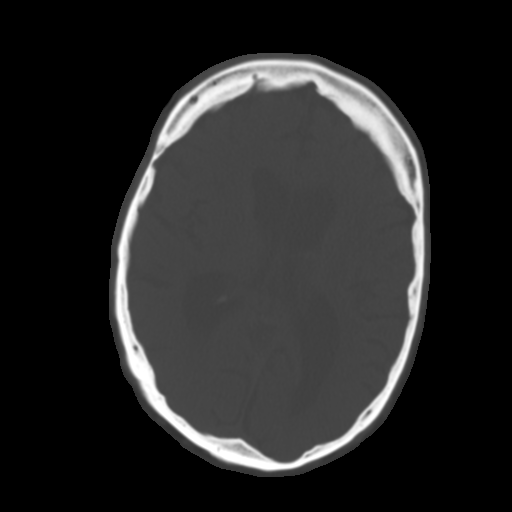
[im 17/31  brain]
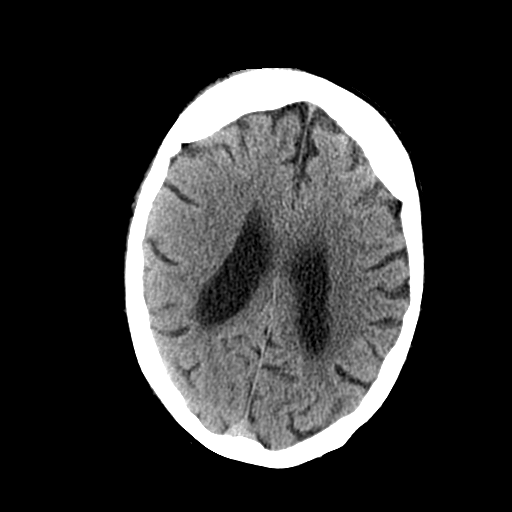
[im 20/31  brain]
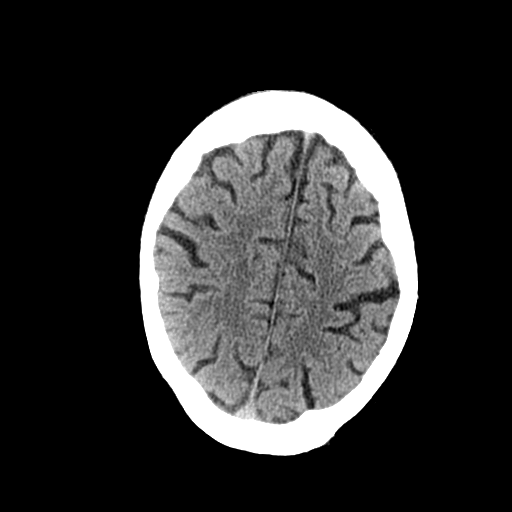
[im 23/31  brain]
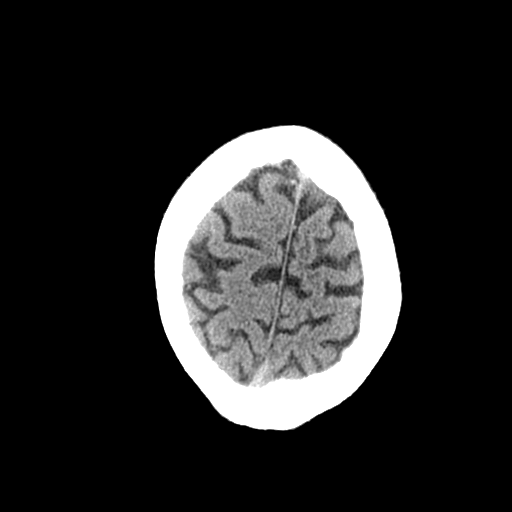
[im 25/31  brain]
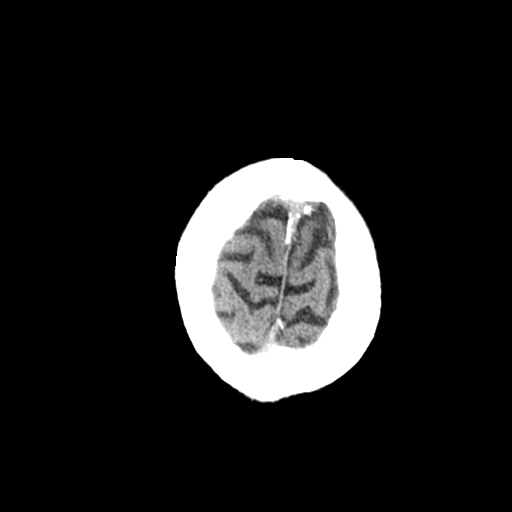
[im 25/31  bone]
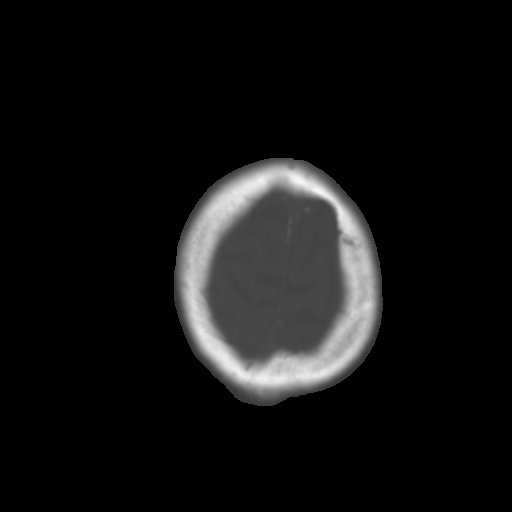
[im 28/31  brain]
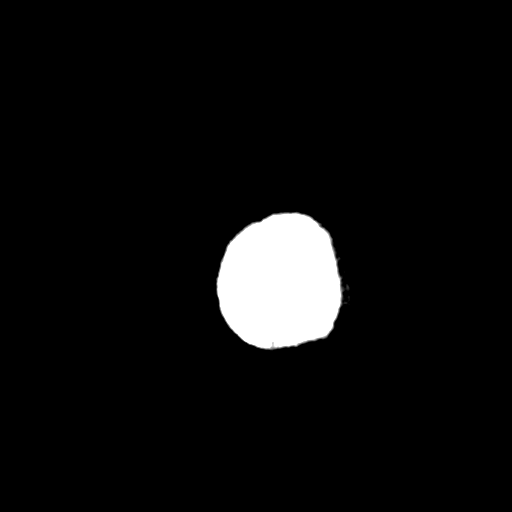

[Series 4: coronal soft tissue · coronal · 0.36mm/px · 3 of 71 slices shown]
[im 24/71  brain]
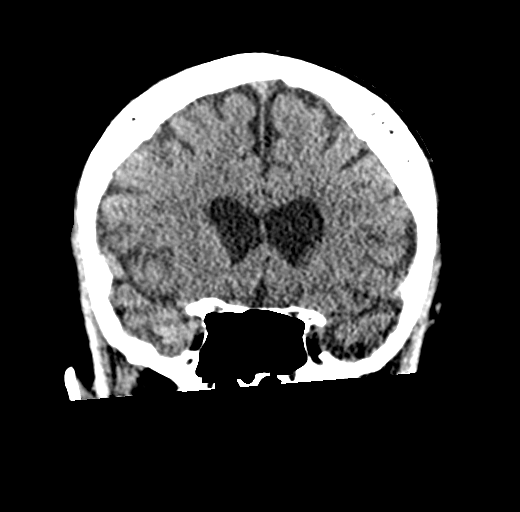
[im 32/71  brain]
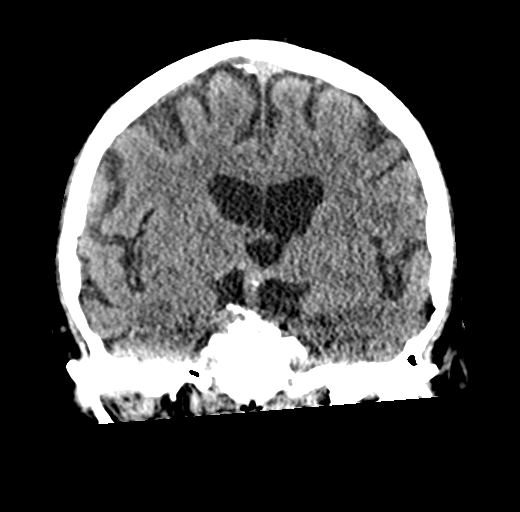
[im 39/71  brain]
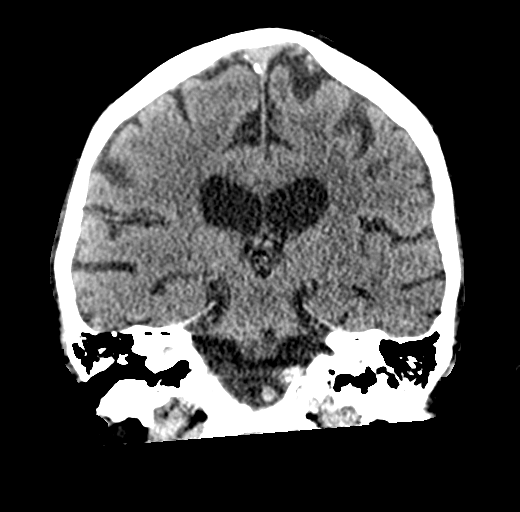

[Series 5: sagittal soft tissue · sagittal · 0.36mm/px · 3 of 56 slices shown]
[im 19/56  brain]
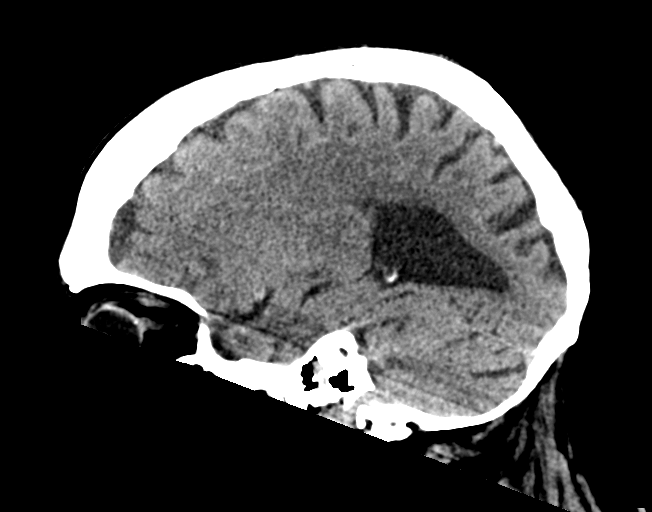
[im 28/56  brain]
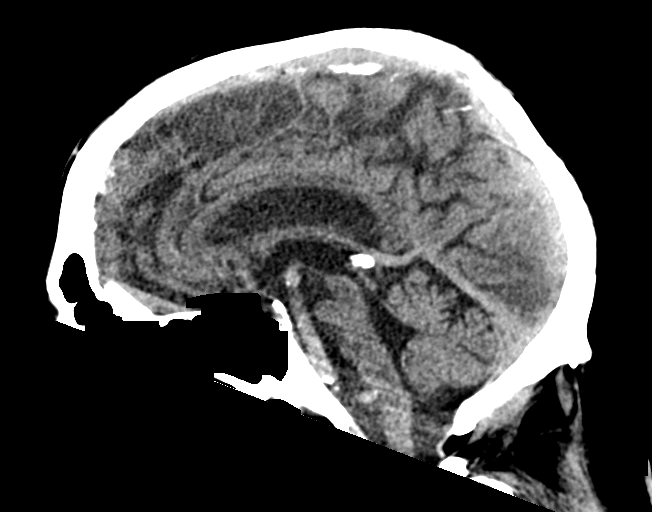
[im 37/56  brain]
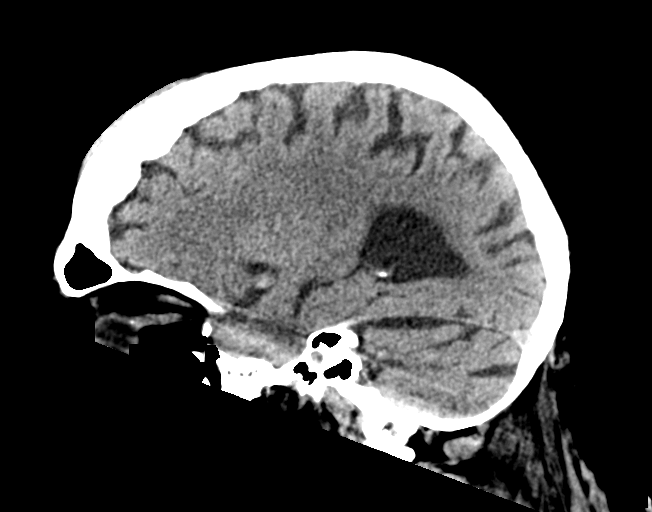

[16 of 47 positions shown; findings below may reference images not displayed]

FINDINGS: Brain: No evidence of acute infarction, hemorrhage, hydrocephalus,
extra-axial collection or mass lesion/mass effect.

Vascular: No hyperdense vessel or unexpected calcification.

Skull: Normal. Negative for fracture or focal lesion.

Sinuses/Orbits: No acute finding.

Other: Soft tissue contusion of the right forehead.
IMPRESSION: 1. No acute intracranial pathology.
2. Soft tissue contusion of the right forehead.

## 2021-06-10 MED ORDER — LIDOCAINE 4 % EX PTCH: 1.0000 "application " | MEDICATED_PATCH | Freq: Two times a day (BID) | CUTANEOUS | 2 refills | Status: AC | PRN

## 2021-06-10 NOTE — ED Triage Notes (Signed)
Pt comes into the ED via [facility] from home c/o fall.  Pt was lifting something heavy when he lost his balance and fell.  Pt fell on concrete.  Pt denies hitting his head, no LOC, but is on blood thinners.    140/80 68/42 with position change IVF 18g L AC 94% RA 66 HR NSR 265 CBG

## 2021-06-10 NOTE — Discharge Instructions (Addendum)
I have given you the prescription you requested for lidocaine patches.  Try not to use more than 1 patch at that time, you should be to cut them in half if she needed in more than 1 place.  He can also use Tylenol if needed for pain.  Please return for any further problems or worse pain or numbness or weakness.

## 2021-06-10 NOTE — ED Provider Notes (Signed)
[facility] Emergency Department Provider Note   ____________________________________________   Event Date/Time   First MD Initiated Contact with Patient 06/10/21 1531     (approximate)  I have reviewed the triage vital signs and the nursing notes.   HISTORY  Chief Complaint Fall    HPI [name] is a 75 y.o. male patient reports that he was carrying a battery tripped and fell.  He reports mid back and neck pain.  He is on Coumadin.  He did not hit his head or pass out.  Currently sitting in the chair has pain about L4-5 but no pain in the neck any longer.  I discussed the CT findings with him and family he does not want an MRI.  He feels that these injuries are old cannot recall when that happened and again has no pain in the area of the injuries at all the palpation really percussion.  He understands the possibility of missing an injury and loss of function below the injury of his arms and legs.        Past Medical History:  Diagnosis Date   Allergy    Hypertension    Stroke [facility])     Patient Active Problem List   Diagnosis Date Noted   Longstanding persistent atrial fibrillation (HCC) 06/09/2020   Moderate mixed hyperlipidemia not requiring statin therapy 06/09/2020   Essential hypertension 11/29/2017   Chronic seasonal allergic rhinitis due to pollen 11/29/2017   Cerebral vascular disease 11/29/2017   Anticoagulated on Coumadin 02/23/2016    Past Surgical History:  Procedure Laterality Date   HERNIA REPAIR      Prior to Admission medications   Medication Sig Start Date End Date Taking? Authorizing Provider  Lidocaine (HM LIDOCAINE PATCH) 4 % PTCH Apply 1 application topically every 12 (twelve) hours as needed. 06/10/21  Yes Arnaldo Natal, MD  atenolol (TENORMIN) 25 MG tablet Take 1 tablet (25 mg total) by mouth daily. 06/01/21   Duanne Limerick, MD  fluticasone (FLONASE) 50 MCG/ACT nasal spray Place 1 spray into both nostrils  daily. 06/01/21   Duanne Limerick, MD  lisinopril (ZESTRIL) 40 MG tablet Take 1 tablet (40 mg total) by mouth daily. 06/01/21   Duanne Limerick, MD  warfarin (COUMADIN) 1 MG tablet TAKE 1 TABLET AS DIRECTED (TAKE 4 MG ON MONDAY, WEDNESDAY, AND FRIDAY AND 3 MG THE REST OF THE WEEK) 07/12/20   Duanne Limerick, MD  warfarin (COUMADIN) 3 MG tablet TAKE 1 TABLET DAILY OR AS DIRECTED 06/09/20   Duanne Limerick, MD  warfarin (COUMADIN) 4 MG tablet TAKE 1 TABLET DAILY OR AS DIRECTED 11/30/20   Duanne Limerick, MD    Allergies Latex  History reviewed. No pertinent family history.  Social History Social History   Tobacco Use   Smoking status: Never   Smokeless tobacco: Never  Substance Use Topics   Alcohol use: No    Alcohol/week: 0.0 standard drinks   Drug use: No    Review of Systems  Constitutional: No fever/chills Eyes: No visual changes. ENT: No sore throat. Cardiovascular: Denies chest pain. Respiratory: Denies shortness of breath. Gastrointestinal: No abdominal pain.  No nausea, no vomiting.  No diarrhea.  No constipation. Genitourinary: Negative for dysuria. Musculoskeletal: Negative for back pain. Skin: Negative for rash. Neurological: Negative for headaches, focal weakness   ____________________________________________   PHYSICAL EXAM:  VITAL SIGNS: ED Triage Vitals  Enc Vitals Group     BP 06/10/21 1255 91/61  Pulse Rate 06/10/21 1255 (!) 56     Resp 06/10/21 1255 18     Temp 06/10/21 1255 97.7 F (36.5 C)     Temp Source 06/10/21 1255 Oral     SpO2 06/10/21 1255 94 %     Weight 06/10/21 1256 214 lb (97.1 kg)     Height 06/10/21 1256 5\' 11"  (1.803 m)     Head Circumference --      Peak Flow --      Pain Score 06/10/21 1256 10     Pain Loc --      Pain Edu? --      Excl. in GC? --     Constitutional: Alert and oriented. Well appearing and in no acute distress. Eyes: Conjunctivae are normal. P Head: Atraumatic.  Except for an abrasion on the back of the  head Nose: No congestion/rhinnorhea. Mouth/Throat: Mucous membranes are moist.  Oropharynx non-erythematous. Neck: No stridor.  No cervical spine tenderness to palpation. Cardiovascular:   Good peripheral circulation. Respiratory: Normal respiratory effort.  No retractions.  Gastrointestinal:  No distention. No abdominal bruits.  No CVA tenderness Musculoskeletal: No lower extremity tenderness nor edema.  No tenderness of the thoracic spine or upper lumbar spine there is some tenderness to palpation of the lower lumbar spine Neurologic:  Normal speech and language. No gross focal neurologic deficits are appreciated. Skin:  Skin is warm, dry and intact. No rash noted.   ____________________________________________   LABS (all labs ordered are listed, but only abnormal results are displayed)  Labs Reviewed  COMPREHENSIVE METABOLIC PANEL - Abnormal; Notable for the following components:      Result Value   Glucose, Bld 232 (*)    Calcium 8.7 (*)    AST 59 (*)    All other components within normal limits  CBC WITH DIFFERENTIAL/PLATELET - Abnormal; Notable for the following components:   WBC 14.7 (*)    Neutro Abs 12.0 (*)    Monocytes Absolute 1.2 (*)    Abs Immature Granulocytes 0.13 (*)    All other components within normal limits  URINALYSIS, ROUTINE W REFLEX MICROSCOPIC  TROPONIN I (HIGH SENSITIVITY)  TROPONIN I (HIGH SENSITIVITY)   ____________________________________________  EKG  EKG read interpreted by me shows normal sinus rhythm rate of 68 extreme right axis poor R wave progress in the V leads.  No acute ST-T changes no old EKGs to compare to ____________________________________________  RADIOLOGY Jill Poling, personally viewed and evaluated these images (plain radiographs) as part of my medical decision making, as well as reviewing the written report by the radiologist.  ED MD interpretation: X-ray of the chest and lumbar spine reviewed by me read by  radiology shows possible mild wedging at the thoracic or lumbar junction there is no tenderness there on exam however CT of the head and neck read by radiology reviewed by me does show some disruption of the osteophytes as noted by the radiologist.  Patient is not tender there either  Official radiology report(s): DG Chest 2 View  Result Date: 06/10/2021 CLINICAL DATA:  dizziness, weakness EXAM: CHEST - 2 VIEW COMPARISON:  None. FINDINGS: The cardiomediastinal silhouette is mildly enlarged in contour.Low lung volumes. No pleural effusion. No pneumothorax. Bibasilar platelike opacities most consistent with atelectasis. Visualized abdomen is unremarkable. Wedging at the thoracolumbar junction, age indeterminate. IMPRESSION: 1. Low lung volume film with bibasilar atelectasis. 2. Mild wedging at the thoracolumbar junction, age indeterminate. Recommend correlation with point tenderness. Electronically Signed  By: Meda Klinefelter M.D.   On: 06/10/2021 13:54   DG Lumbar Spine 2-3 Views  Result Date: 06/10/2021 CLINICAL DATA:  Fall, pain EXAM: LUMBAR SPINE - 2-3 VIEW COMPARISON:  None. FINDINGS: Diffuse ankylosis of the lumbar spine. No evident fracture or dislocation. Moderate to severe multilevel facet degenerative change. Nonobstructive pattern of overlying bowel gas. IMPRESSION: 1. 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 2. No evident fracture or dislocation. CT or MRI may be more sensitive for subtle fracture if there is high clinical suspicion. Electronically Signed   By: Jearld Lesch M.D.   On: 06/10/2021 13:52   CT Head Wo Contrast  Result Date: 06/10/2021 CLINICAL DATA:  Fall, head trauma EXAM: CT HEAD WITHOUT CONTRAST TECHNIQUE: Contiguous axial images were obtained from the base of the skull through the vertex without intravenous contrast. COMPARISON:  None. FINDINGS: Brain: No evidence of acute infarction, hemorrhage, hydrocephalus, extra-axial collection or  mass lesion/mass effect. Vascular: No hyperdense vessel or unexpected calcification. Skull: Normal. Negative for fracture or focal lesion. Sinuses/Orbits: No acute finding. Other: Soft tissue contusion of the right forehead. IMPRESSION: 1. No acute intracranial pathology. 2. Soft tissue contusion of the right forehead. Electronically Signed   By: Jearld Lesch M.D.   On: 06/10/2021 14:21   CT Cervical Spine Wo Contrast  Result Date: 06/10/2021 CLINICAL DATA:  Fall on concrete.  Anticoagulation. EXAM: CT CERVICAL SPINE WITHOUT CONTRAST TECHNIQUE: Multidetector CT imaging of the cervical spine was performed without intravenous contrast. Multiplanar CT image reconstructions were also generated. COMPARISON:  None. FINDINGS: Alignment: Mild reversal of the normal cervical lordosis. No subluxation identified. Skull base and vertebrae: There is mild splaying of the interspinous space at C5-6 along with anterior wedging/loss of height of the C6 vertebra and some irregularity/discontinuity along the bridging anterior osteophytes at the C5-6 level. That said, we do not demonstrate sharp discontinuities in the cortex at C6 or in the osteophytes, and accordingly I this is substantially more likely to be from a chronic fracture rather than acute. Given the lack of prior exams, it might be prudent to confirm with MRI. Otherwise there are continuous osteophytes along the anterior vertebral column between C3 and T2. Degenerative spurring loss of articular space at the anterior C1-2 articulation. Soft tissues and spinal canal: There is high density projecting in along the spinal canal at the T2 level and below, believed to be attributable to beam hardening artifact given the similar findings in the surrounding soft tissues, but if the patient has a profound or progressive neurologic deficit then this may warrant further investigation such as with thoracic spine MRI. Atherosclerotic calcification of the aortic branch vessels.  Disc levels: C2-3: Mild left foraminal stenosis due to facet spurring. C3-4: Moderate to prominent left foraminal stenosis due to facet and left eccentric intervertebral spurring. C4-5: No impingement identified. C5-6: No impingement. C6-7: No impingement. C7-T1: No impingement. Upper chest: No additional findings. Other: No supplemental non-categorized findings. IMPRESSION: 1. Anterior wedging of the C6 vertebral body with discontinuity along the anterior osteophytes at the C5-6 level, along with mild interspinous splaying at the C5-6 level. There is a strong likelihood that this is old/chronic given the lack of sharply defined cortical discontinuity and the relative cortication of the osteophytes. Given the lack of prior comparison exams, MRI of the cervical spine without contrast is recommended to confirm the chronicity of these findings unless the patient has a known history of remote C6 compression. 2. Otherwise anterior flowing osteophytes between C3  and T2. 3. Left foraminal impingement at C3-4 due to facet and left eccentric intervertebral spurring. Mild left foraminal stenosis at C2-3. 4. Atherosclerosis. Electronically Signed   By: Gaylyn Rong M.D.   On: 06/10/2021 14:33    ____________________________________________   PROCEDURES  Procedure(s) performed (including Critical Care):  Procedures   ____________________________________________   INITIAL IMPRESSION / ASSESSMENT AND PLAN / ED COURSE  Gust in detail the findings and demonstrated to the patient and family patient again does not feel tender does not want an MRI wants to go home asked for lidocaine patch which I have prescribed.  They understand the consequences of missing a fracture but again there is no tenderness to clinically there should not be a acute fracture.  I will comply with the patient's wishes and discharged him.             ____________________________________________   FINAL CLINICAL IMPRESSION(S) /  ED DIAGNOSES  Final diagnoses:  Fall, initial encounter     ED Discharge Orders          Ordered    Lidocaine (HM LIDOCAINE PATCH) 4 % PTCH  Every 12 hours PRN        06/10/21 1549             Note:  This document was prepared using Dragon voice recognition software and may include unintentional dictation errors.    Arnaldo Natal, MD 06/10/21 [phone]

## 2021-06-10 NOTE — ED Notes (Signed)
Pt c/o of upper, middle, and lower back pain, as well as cervical spine pain. Pt denies dizzines, LOC, or head injury. Pt is AOX4, NAD noted. Pt requests lidocaine patch for back.

## 2021-06-10 NOTE — ED Provider Notes (Signed)
Emergency Medicine Provider Triage Evaluation Note  [name] , a 75 y.o. male  was evaluated in triage.  Pt complains of mid back to neck pain after losing his balance while lifting a battery. Fell onto concrete. Denies striking his head or loss of consciousness. On Coumadin. EMS report orthostatic--gave bolus.  Review of Systems  Positive: Musculoskeletal pain. Negative: Loss of consciousness.  Physical Exam  BP 91/61 (BP Location: Right Arm)   Pulse (!) 56   Temp 97.7 F (36.5 C) (Oral)   Resp 18   Ht 5\' 11"  (1.803 m)   Wt 97.1 kg   SpO2 94%   BMI 29.85 kg/m  Gen:   Awake, no distress   Resp:  Normal effort  MSK:   Moves extremities without difficulty  Other:    Medical Decision Making  Medically screening exam initiated at 1:00 PM.  Appropriate orders placed.  [name] was informed that the remainder of the evaluation will be completed by another provider, this initial triage assessment does not replace that evaluation, and the importance of remaining in the ED until their evaluation is complete.   Mark Parkins, FNP 06/10/21 1300    13/04/22, MD 06/10/21 1949

## 2021-06-11 ENCOUNTER — Other Ambulatory Visit: Payer: Self-pay

## 2021-06-11 ENCOUNTER — Encounter: Payer: Self-pay | Admitting: Emergency Medicine

## 2021-06-11 ENCOUNTER — Telehealth: Payer: Self-pay | Admitting: Emergency Medicine

## 2021-06-11 DIAGNOSIS — S32018A Other fracture of first lumbar vertebra, initial encounter for closed fracture: Principal | ICD-10-CM | POA: Diagnosis present

## 2021-06-11 DIAGNOSIS — Z7901 Long term (current) use of anticoagulants: Secondary | ICD-10-CM

## 2021-06-11 DIAGNOSIS — T794XXA Traumatic shock, initial encounter: Secondary | ICD-10-CM | POA: Diagnosis present

## 2021-06-11 DIAGNOSIS — S12590A Other displaced fracture of sixth cervical vertebra, initial encounter for closed fracture: Secondary | ICD-10-CM | POA: Diagnosis present

## 2021-06-11 DIAGNOSIS — E782 Mixed hyperlipidemia: Secondary | ICD-10-CM | POA: Diagnosis present

## 2021-06-11 DIAGNOSIS — Y92008 Other place in unspecified non-institutional (private) residence as the place of occurrence of the external cause: Secondary | ICD-10-CM

## 2021-06-11 DIAGNOSIS — R0902 Hypoxemia: Secondary | ICD-10-CM | POA: Diagnosis not present

## 2021-06-11 DIAGNOSIS — M532X6 Spinal instabilities, lumbar region: Secondary | ICD-10-CM | POA: Diagnosis present

## 2021-06-11 DIAGNOSIS — I69351 Hemiplegia and hemiparesis following cerebral infarction affecting right dominant side: Secondary | ICD-10-CM

## 2021-06-11 DIAGNOSIS — M549 Dorsalgia, unspecified: Secondary | ICD-10-CM | POA: Diagnosis not present

## 2021-06-11 DIAGNOSIS — Z66 Do not resuscitate: Secondary | ICD-10-CM | POA: Diagnosis present

## 2021-06-11 DIAGNOSIS — Z823 Family history of stroke: Secondary | ICD-10-CM

## 2021-06-11 DIAGNOSIS — N182 Chronic kidney disease, stage 2 (mild): Secondary | ICD-10-CM | POA: Diagnosis present

## 2021-06-11 DIAGNOSIS — Z79899 Other long term (current) drug therapy: Secondary | ICD-10-CM

## 2021-06-11 DIAGNOSIS — I10 Essential (primary) hypertension: Secondary | ICD-10-CM | POA: Diagnosis not present

## 2021-06-11 DIAGNOSIS — S140XXA Concussion and edema of cervical spinal cord, initial encounter: Secondary | ICD-10-CM | POA: Diagnosis present

## 2021-06-11 DIAGNOSIS — S12490A Other displaced fracture of fifth cervical vertebra, initial encounter for closed fracture: Secondary | ICD-10-CM | POA: Diagnosis present

## 2021-06-11 DIAGNOSIS — M4802 Spinal stenosis, cervical region: Secondary | ICD-10-CM | POA: Diagnosis present

## 2021-06-11 DIAGNOSIS — D72828 Other elevated white blood cell count: Secondary | ICD-10-CM | POA: Diagnosis present

## 2021-06-11 DIAGNOSIS — J90 Pleural effusion, not elsewhere classified: Secondary | ICD-10-CM | POA: Diagnosis present

## 2021-06-11 DIAGNOSIS — I482 Chronic atrial fibrillation, unspecified: Secondary | ICD-10-CM | POA: Diagnosis present

## 2021-06-11 DIAGNOSIS — Z20822 Contact with and (suspected) exposure to covid-19: Secondary | ICD-10-CM | POA: Diagnosis present

## 2021-06-11 DIAGNOSIS — W19XXXA Unspecified fall, initial encounter: Secondary | ICD-10-CM | POA: Diagnosis not present

## 2021-06-11 DIAGNOSIS — W1839XA Other fall on same level, initial encounter: Secondary | ICD-10-CM | POA: Diagnosis present

## 2021-06-11 DIAGNOSIS — R52 Pain, unspecified: Secondary | ICD-10-CM | POA: Diagnosis not present

## 2021-06-11 DIAGNOSIS — S0083XA Contusion of other part of head, initial encounter: Secondary | ICD-10-CM | POA: Diagnosis present

## 2021-06-11 DIAGNOSIS — I9581 Postprocedural hypotension: Secondary | ICD-10-CM | POA: Diagnosis not present

## 2021-06-11 DIAGNOSIS — R339 Retention of urine, unspecified: Secondary | ICD-10-CM | POA: Diagnosis present

## 2021-06-11 DIAGNOSIS — Z515 Encounter for palliative care: Secondary | ICD-10-CM

## 2021-06-11 DIAGNOSIS — M532X2 Spinal instabilities, cervical region: Secondary | ICD-10-CM | POA: Diagnosis present

## 2021-06-11 DIAGNOSIS — I129 Hypertensive chronic kidney disease with stage 1 through stage 4 chronic kidney disease, or unspecified chronic kidney disease: Secondary | ICD-10-CM | POA: Diagnosis present

## 2021-06-11 DIAGNOSIS — M2578 Osteophyte, vertebrae: Secondary | ICD-10-CM | POA: Diagnosis present

## 2021-06-11 MED ORDER — OXYCODONE HCL 5 MG PO TABS
5.0000 mg | ORAL_TABLET | Freq: Three times a day (TID) | ORAL | 0 refills | Status: AC | PRN
Start: 2021-06-11 — End: 2021-06-14

## 2021-06-11 NOTE — ED Notes (Signed)
Tylenol at 8pm and Ibuprofen at 11:30 am per daughter pt has a bruise to right eye reports he did not have that bruise before coming to ER

## 2021-06-11 NOTE — Telephone Encounter (Signed)
Received phone call from the patient's son.  Apparently was seen yesterday by Dr. Juliette Alcide for back pain after a fall.  Was offered pain medication at discharge but he refused.  Has been taking Lidoderm patches but is having significant pain at home.  Patient not able to take any NSAIDs because he is on Coumadin.  Will write for very short course of oxycodone.  Discussed with the patient son that he is not able to drive and the risks of respiratory depression and sedation to look out for.

## 2021-06-11 NOTE — ED Triage Notes (Signed)
Pt reports he was seen yesterday after he had a fall reports coming back today due to increase pain, reports has lidocaine patches for the pain and Tylenol. Pt reports back is so tight. Pt talks in complete sentences no distress noted

## 2021-06-12 ENCOUNTER — Encounter: Admission: EM | Disposition: E | Payer: Self-pay | Source: Home / Self Care | Attending: Internal Medicine

## 2021-06-12 ENCOUNTER — Other Ambulatory Visit: Payer: Self-pay

## 2021-06-12 ENCOUNTER — Inpatient Hospital Stay: Payer: Medicare Other | Admitting: Registered Nurse

## 2021-06-12 ENCOUNTER — Inpatient Hospital Stay
Admission: EM | Admit: 2021-06-12 | Discharge: 2021-07-07 | DRG: 456 | Disposition: E | Payer: Medicare Other | Attending: Pulmonary Disease | Admitting: Pulmonary Disease

## 2021-06-12 ENCOUNTER — Inpatient Hospital Stay: Payer: Medicare Other

## 2021-06-12 ENCOUNTER — Emergency Department: Payer: Medicare Other

## 2021-06-12 ENCOUNTER — Encounter: Payer: Self-pay | Admitting: Internal Medicine

## 2021-06-12 DIAGNOSIS — Z66 Do not resuscitate: Secondary | ICD-10-CM | POA: Diagnosis present

## 2021-06-12 DIAGNOSIS — D72829 Elevated white blood cell count, unspecified: Secondary | ICD-10-CM | POA: Diagnosis not present

## 2021-06-12 DIAGNOSIS — E785 Hyperlipidemia, unspecified: Secondary | ICD-10-CM | POA: Diagnosis not present

## 2021-06-12 DIAGNOSIS — I10 Essential (primary) hypertension: Secondary | ICD-10-CM

## 2021-06-12 DIAGNOSIS — N182 Chronic kidney disease, stage 2 (mild): Secondary | ICD-10-CM | POA: Diagnosis present

## 2021-06-12 DIAGNOSIS — Z981 Arthrodesis status: Secondary | ICD-10-CM | POA: Diagnosis not present

## 2021-06-12 DIAGNOSIS — I639 Cerebral infarction, unspecified: Secondary | ICD-10-CM | POA: Insufficient documentation

## 2021-06-12 DIAGNOSIS — G9519 Other vascular myelopathies: Secondary | ICD-10-CM | POA: Diagnosis not present

## 2021-06-12 DIAGNOSIS — S12400A Unspecified displaced fracture of fifth cervical vertebra, initial encounter for closed fracture: Secondary | ICD-10-CM | POA: Diagnosis not present

## 2021-06-12 DIAGNOSIS — E782 Mixed hyperlipidemia: Secondary | ICD-10-CM | POA: Diagnosis present

## 2021-06-12 DIAGNOSIS — S32008A Other fracture of unspecified lumbar vertebra, initial encounter for closed fracture: Principal | ICD-10-CM

## 2021-06-12 DIAGNOSIS — Z9889 Other specified postprocedural states: Secondary | ICD-10-CM | POA: Diagnosis not present

## 2021-06-12 DIAGNOSIS — S32012A Unstable burst fracture of first lumbar vertebra, initial encounter for closed fracture: Secondary | ICD-10-CM | POA: Diagnosis not present

## 2021-06-12 DIAGNOSIS — Z7901 Long term (current) use of anticoagulants: Secondary | ICD-10-CM | POA: Diagnosis not present

## 2021-06-12 DIAGNOSIS — S32052A Unstable burst fracture of fifth lumbar vertebra, initial encounter for closed fracture: Secondary | ICD-10-CM | POA: Diagnosis not present

## 2021-06-12 DIAGNOSIS — Z20822 Contact with and (suspected) exposure to covid-19: Secondary | ICD-10-CM | POA: Diagnosis present

## 2021-06-12 DIAGNOSIS — S12590A Other displaced fracture of sixth cervical vertebra, initial encounter for closed fracture: Secondary | ICD-10-CM | POA: Diagnosis present

## 2021-06-12 DIAGNOSIS — S32010A Wedge compression fracture of first lumbar vertebra, initial encounter for closed fracture: Secondary | ICD-10-CM | POA: Diagnosis not present

## 2021-06-12 DIAGNOSIS — S34109A Unspecified injury to unspecified level of lumbar spinal cord, initial encounter: Secondary | ICD-10-CM

## 2021-06-12 DIAGNOSIS — M2578 Osteophyte, vertebrae: Secondary | ICD-10-CM | POA: Diagnosis present

## 2021-06-12 DIAGNOSIS — M4326 Fusion of spine, lumbar region: Secondary | ICD-10-CM | POA: Diagnosis not present

## 2021-06-12 DIAGNOSIS — I9581 Postprocedural hypotension: Secondary | ICD-10-CM | POA: Diagnosis not present

## 2021-06-12 DIAGNOSIS — S32018A Other fracture of first lumbar vertebra, initial encounter for closed fracture: Principal | ICD-10-CM

## 2021-06-12 DIAGNOSIS — S064XAA Epidural hemorrhage with loss of consciousness status unknown, initial encounter: Secondary | ICD-10-CM

## 2021-06-12 DIAGNOSIS — M542 Cervicalgia: Secondary | ICD-10-CM | POA: Diagnosis not present

## 2021-06-12 DIAGNOSIS — S129XXA Fracture of neck, unspecified, initial encounter: Secondary | ICD-10-CM | POA: Diagnosis present

## 2021-06-12 DIAGNOSIS — S32019A Unspecified fracture of first lumbar vertebra, initial encounter for closed fracture: Secondary | ICD-10-CM | POA: Diagnosis not present

## 2021-06-12 DIAGNOSIS — M532X2 Spinal instabilities, cervical region: Secondary | ICD-10-CM | POA: Diagnosis present

## 2021-06-12 DIAGNOSIS — R339 Retention of urine, unspecified: Secondary | ICD-10-CM | POA: Diagnosis present

## 2021-06-12 DIAGNOSIS — Z419 Encounter for procedure for purposes other than remedying health state, unspecified: Secondary | ICD-10-CM

## 2021-06-12 DIAGNOSIS — Y92008 Other place in unspecified non-institutional (private) residence as the place of occurrence of the external cause: Secondary | ICD-10-CM | POA: Diagnosis not present

## 2021-06-12 DIAGNOSIS — Z515 Encounter for palliative care: Secondary | ICD-10-CM | POA: Diagnosis not present

## 2021-06-12 DIAGNOSIS — Z823 Family history of stroke: Secondary | ICD-10-CM | POA: Diagnosis not present

## 2021-06-12 DIAGNOSIS — T794XXA Traumatic shock, initial encounter: Secondary | ICD-10-CM | POA: Diagnosis present

## 2021-06-12 DIAGNOSIS — S12500A Unspecified displaced fracture of sixth cervical vertebra, initial encounter for closed fracture: Secondary | ICD-10-CM | POA: Diagnosis not present

## 2021-06-12 DIAGNOSIS — S140XXA Concussion and edema of cervical spinal cord, initial encounter: Secondary | ICD-10-CM | POA: Diagnosis present

## 2021-06-12 DIAGNOSIS — I679 Cerebrovascular disease, unspecified: Secondary | ICD-10-CM | POA: Diagnosis present

## 2021-06-12 DIAGNOSIS — Z01818 Encounter for other preprocedural examination: Secondary | ICD-10-CM | POA: Diagnosis not present

## 2021-06-12 DIAGNOSIS — I621 Nontraumatic extradural hemorrhage: Secondary | ICD-10-CM | POA: Diagnosis not present

## 2021-06-12 DIAGNOSIS — S32009A Unspecified fracture of unspecified lumbar vertebra, initial encounter for closed fracture: Secondary | ICD-10-CM | POA: Diagnosis not present

## 2021-06-12 DIAGNOSIS — I129 Hypertensive chronic kidney disease with stage 1 through stage 4 chronic kidney disease, or unspecified chronic kidney disease: Secondary | ICD-10-CM | POA: Diagnosis present

## 2021-06-12 DIAGNOSIS — S12490A Other displaced fracture of fifth cervical vertebra, initial encounter for closed fracture: Secondary | ICD-10-CM | POA: Diagnosis present

## 2021-06-12 DIAGNOSIS — W1839XA Other fall on same level, initial encounter: Secondary | ICD-10-CM | POA: Diagnosis present

## 2021-06-12 DIAGNOSIS — M4802 Spinal stenosis, cervical region: Secondary | ICD-10-CM | POA: Diagnosis present

## 2021-06-12 DIAGNOSIS — S12000A Unspecified displaced fracture of first cervical vertebra, initial encounter for closed fracture: Secondary | ICD-10-CM | POA: Diagnosis not present

## 2021-06-12 DIAGNOSIS — D72828 Other elevated white blood cell count: Secondary | ICD-10-CM | POA: Diagnosis present

## 2021-06-12 DIAGNOSIS — M545 Low back pain, unspecified: Secondary | ICD-10-CM | POA: Diagnosis not present

## 2021-06-12 DIAGNOSIS — S22089A Unspecified fracture of T11-T12 vertebra, initial encounter for closed fracture: Secondary | ICD-10-CM | POA: Diagnosis not present

## 2021-06-12 DIAGNOSIS — I482 Chronic atrial fibrillation, unspecified: Secondary | ICD-10-CM | POA: Diagnosis present

## 2021-06-12 DIAGNOSIS — J90 Pleural effusion, not elsewhere classified: Secondary | ICD-10-CM | POA: Diagnosis present

## 2021-06-12 DIAGNOSIS — M532X6 Spinal instabilities, lumbar region: Secondary | ICD-10-CM | POA: Diagnosis present

## 2021-06-12 DIAGNOSIS — I69351 Hemiplegia and hemiparesis following cerebral infarction affecting right dominant side: Secondary | ICD-10-CM | POA: Diagnosis not present

## 2021-06-12 DIAGNOSIS — S0083XA Contusion of other part of head, initial encounter: Secondary | ICD-10-CM | POA: Diagnosis present

## 2021-06-12 HISTORY — PX: POSTERIOR CERVICAL FUSION/FORAMINOTOMY: SHX5038

## 2021-06-12 HISTORY — PX: POSTERIOR LUMBAR FUSION 4 LEVEL: SHX6037

## 2021-06-12 LAB — CBC
HCT: 37.4 % — ABNORMAL LOW (ref 39.0–52.0)
Hemoglobin: 12.8 g/dL — ABNORMAL LOW (ref 13.0–17.0)
MCH: 32.3 pg (ref 26.0–34.0)
MCHC: 34.2 g/dL (ref 30.0–36.0)
MCV: 94.4 fL (ref 80.0–100.0)
Platelets: 162 10*3/uL (ref 150–400)
RBC: 3.96 MIL/uL — ABNORMAL LOW (ref 4.22–5.81)
RDW: 13.3 % (ref 11.5–15.5)
WBC: 13.8 10*3/uL — ABNORMAL HIGH (ref 4.0–10.5)
nRBC: 0 % (ref 0.0–0.2)

## 2021-06-12 LAB — BLOOD GAS, ARTERIAL
Acid-base deficit: 0.6 mmol/L (ref 0.0–2.0)
Acid-base deficit: 3.8 mmol/L — ABNORMAL HIGH (ref 0.0–2.0)
Bicarbonate: 24.9 mmol/L (ref 20.0–28.0)
Bicarbonate: 22.2 mmol/L (ref 20.0–28.0)
FIO2: 0.35
MECHVT: 550 mL
O2 Saturation: 98.8 %
O2 Saturation: 98 %
PEEP: 8 cmH2O
Patient temperature: 37
Patient temperature: 37
RATE: 16 resp/min
pCO2 arterial: 43 mmHg (ref 32.0–48.0)
pCO2 arterial: 43 mmHg (ref 32.0–48.0)
pH, Arterial: 7.37 (ref 7.350–7.450)
pH, Arterial: 7.32 — ABNORMAL LOW (ref 7.350–7.450)
pO2, Arterial: 127 mmHg — ABNORMAL HIGH (ref 83.0–108.0)
pO2, Arterial: 112 mmHg — ABNORMAL HIGH (ref 83.0–108.0)

## 2021-06-12 LAB — PROTIME-INR
INR: 2.3 — ABNORMAL HIGH (ref 0.8–1.2)
INR: 1.3 — ABNORMAL HIGH (ref 0.8–1.2)
Prothrombin Time: 25.6 seconds — ABNORMAL HIGH (ref 11.4–15.2)
Prothrombin Time: 16.4 seconds — ABNORMAL HIGH (ref 11.4–15.2)

## 2021-06-12 LAB — BASIC METABOLIC PANEL
Anion gap: 8 (ref 5–15)
BUN: 25 mg/dL — ABNORMAL HIGH (ref 8–23)
CO2: 26 mmol/L (ref 22–32)
Calcium: 8.8 mg/dL — ABNORMAL LOW (ref 8.9–10.3)
Chloride: 102 mmol/L (ref 98–111)
Creatinine, Ser: 1.26 mg/dL — ABNORMAL HIGH (ref 0.61–1.24)
GFR, Estimated: 60 mL/min — ABNORMAL LOW (ref >60)
Glucose, Bld: 153 mg/dL — ABNORMAL HIGH (ref 70–99)
Potassium: 4 mmol/L (ref 3.5–5.1)
Sodium: 136 mmol/L (ref 135–145)

## 2021-06-12 LAB — URINALYSIS, COMPLETE (UACMP) WITH MICROSCOPIC
Bacteria, UA: NONE SEEN
Bilirubin Urine: NEGATIVE
Glucose, UA: 50 mg/dL — AB
Ketones, ur: 5 mg/dL — AB
Leukocytes,Ua: NEGATIVE
Nitrite: NEGATIVE
Protein, ur: 30 mg/dL — AB
Specific Gravity, Urine: 1.024 (ref 1.005–1.030)
Squamous Epithelial / HPF: NONE SEEN (ref 0–5)
pH: 5 (ref 5.0–8.0)

## 2021-06-12 LAB — CBC WITH DIFFERENTIAL/PLATELET
Abs Immature Granulocytes: 0.08 10*3/uL — ABNORMAL HIGH (ref 0.00–0.07)
Basophils Absolute: 0.1 10*3/uL (ref 0.0–0.1)
Basophils Relative: 1 %
Eosinophils Absolute: 0.1 10*3/uL (ref 0.0–0.5)
Eosinophils Relative: 0 %
HCT: 44.6 % (ref 39.0–52.0)
Hemoglobin: 15.1 g/dL (ref 13.0–17.0)
Immature Granulocytes: 1 %
Lymphocytes Relative: 16 %
Lymphs Abs: 2 10*3/uL (ref 0.7–4.0)
MCH: 31.9 pg (ref 26.0–34.0)
MCHC: 33.9 g/dL (ref 30.0–36.0)
MCV: 94.3 fL (ref 80.0–100.0)
Monocytes Absolute: 1.6 10*3/uL — ABNORMAL HIGH (ref 0.1–1.0)
Monocytes Relative: 13 %
Neutro Abs: 8.9 10*3/uL — ABNORMAL HIGH (ref 1.7–7.7)
Neutrophils Relative %: 69 %
Platelets: 135 10*3/uL — ABNORMAL LOW (ref 150–400)
RBC: 4.73 MIL/uL (ref 4.22–5.81)
RDW: 13.3 % (ref 11.5–15.5)
WBC: 12.8 10*3/uL — ABNORMAL HIGH (ref 4.0–10.5)
nRBC: 0 % (ref 0.0–0.2)

## 2021-06-12 LAB — RESP PANEL BY RT-PCR (FLU A&B, COVID) ARPGX2
Influenza A by PCR: NEGATIVE
Influenza B by PCR: NEGATIVE
SARS Coronavirus 2 by RT PCR: NEGATIVE

## 2021-06-12 LAB — CBG MONITORING, ED: Glucose-Capillary: 206 mg/dL — ABNORMAL HIGH (ref 70–99)

## 2021-06-12 LAB — HEMOGLOBIN AND HEMATOCRIT, BLOOD
HCT: 32.3 % — ABNORMAL LOW (ref 39.0–52.0)
Hemoglobin: 10.7 g/dL — ABNORMAL LOW (ref 13.0–17.0)

## 2021-06-12 LAB — TYPE AND SCREEN
ABO/RH(D): O POS
Antibody Screen: NEGATIVE

## 2021-06-12 IMAGING — RF DG C-ARM 1-60 MIN
1 series · 4 of 4 positions shown · non-contrast
Comparison: Preoperative imaging.

CLINICAL DATA: Laminectomy with posterior-lateral arthrodesis.
Posterior fusion 4 levels.

EXAM:
LUMBAR SPINE - 2-3 VIEW; DG C-ARM 1-60 MIN

[Series 1: dg x-ray · 0.20mm/px · 4 of 4 slices shown]
[im 1/4]
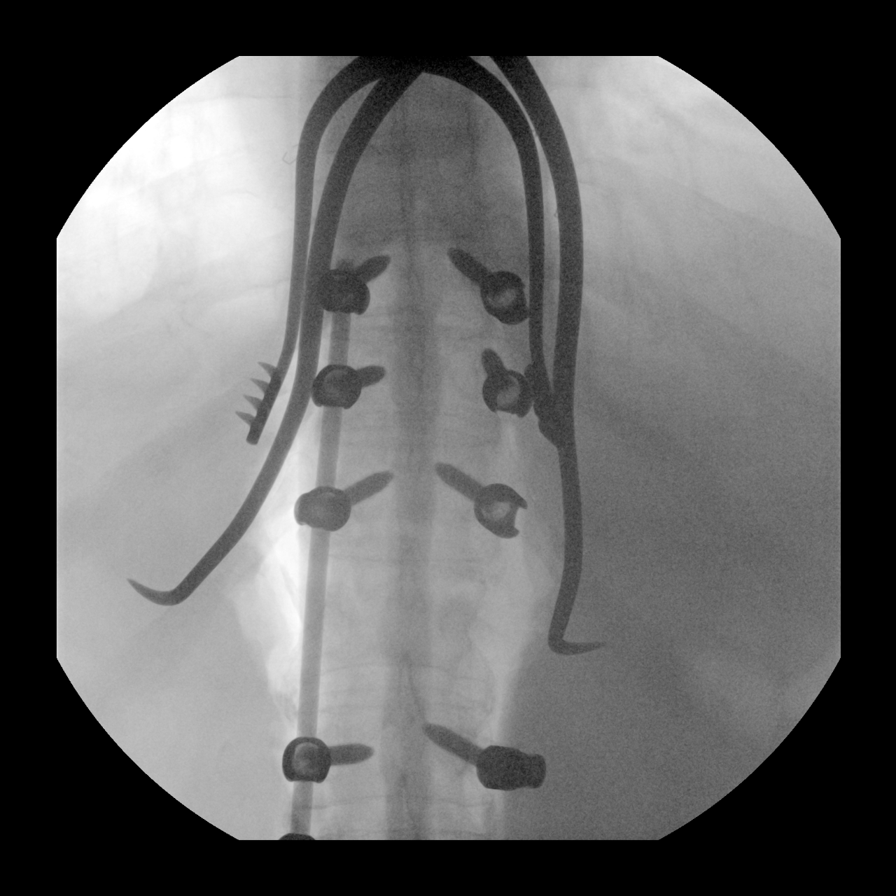
[im 2/4]
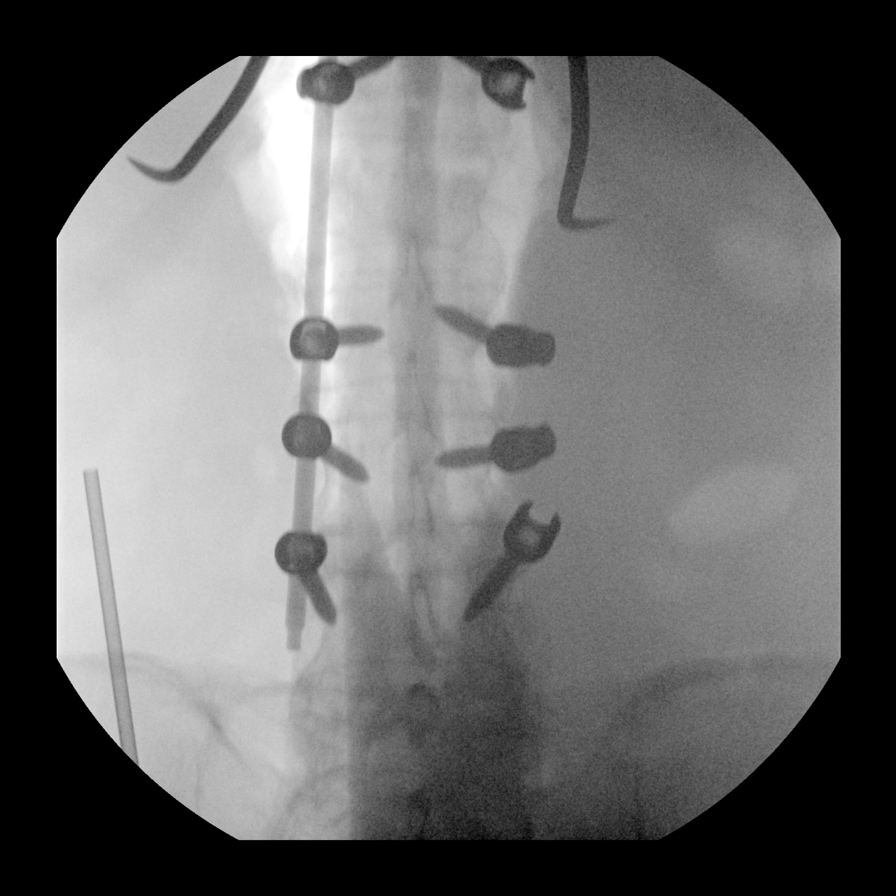
[im 3/4]
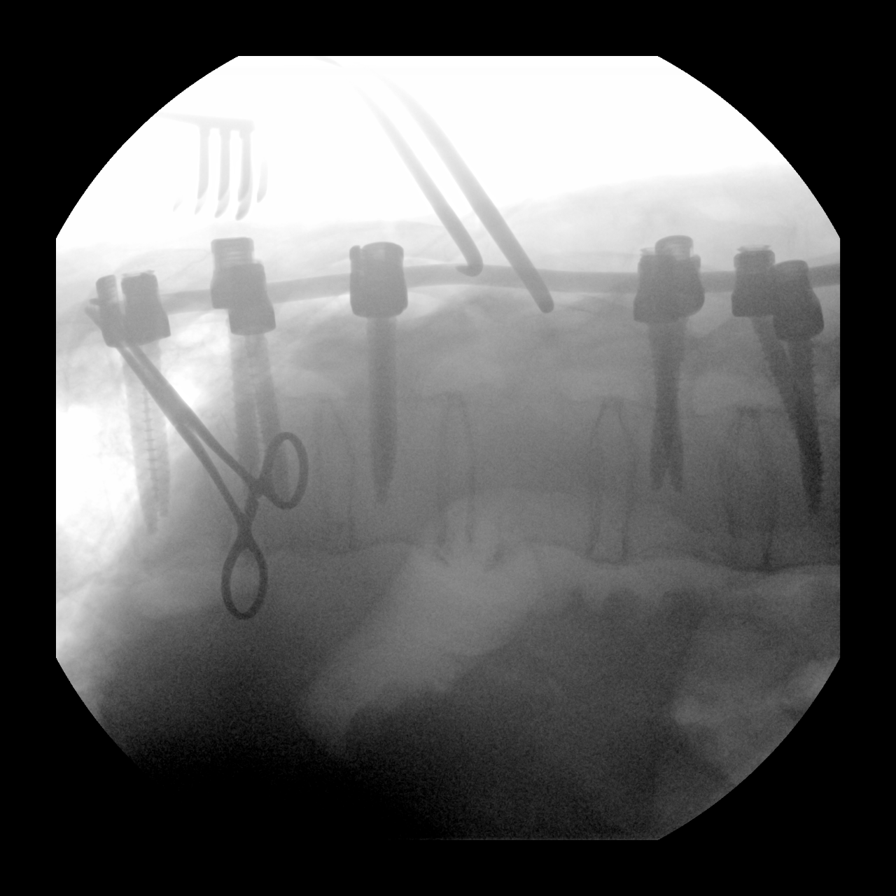
[im 4/4]
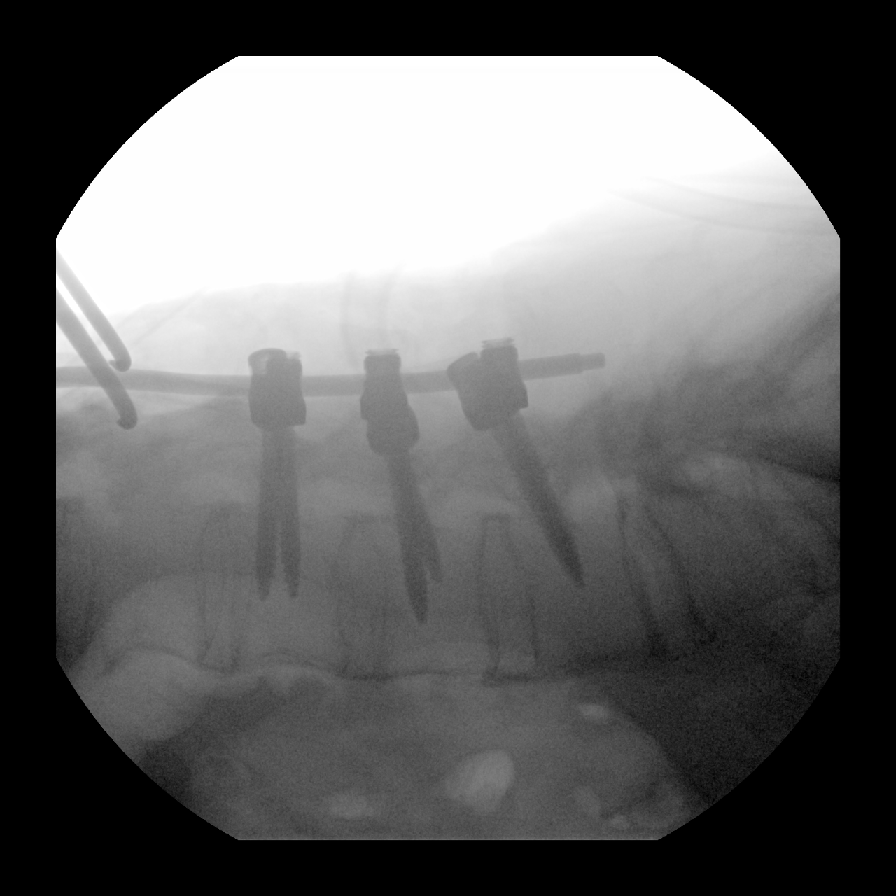

[4 of 4 positions shown; findings below may reference images not displayed]

FINDINGS: Four fluoroscopic spot views of the lumbar spine obtained in the
operating room. Pedicle screws at T10, T11, T12, L2, L3, and L4.
Posterior rod is demonstrated on the right on provided images.
Fluoroscopy time 12 seconds.
IMPRESSION: Fluoroscopic spot views during spinal fusion.

## 2021-06-12 IMAGING — MR MR CERVICAL SPINE W/O CM
5 series · 38 of 48 positions shown · non-contrast
Comparison: Thoracic spine CT today, and cervical spine CT
[DATE].

CLINICAL DATA: Study discussed by telephone with neurosurgery Dr.
UMAT on [DATE] at [XB] hours.

EXAM:
MRI CERVICAL SPINE WITHOUT CONTRAST
TECHNIQUE: Multiplanar, multisequence MR imaging of the cervical spine was
performed. No intravenous contrast was administered.

[Series 6: STIR · sagittal · 3.0mm · 0.62mm/px · 6 of 15 slices shown]
[im 1/15]
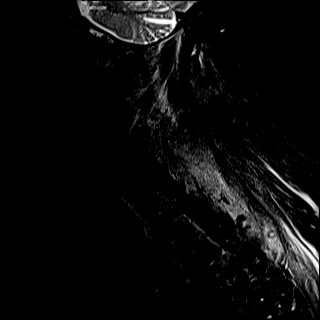
[im 3/15]
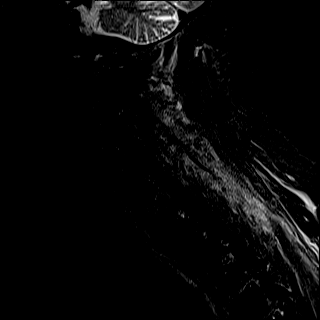
[im 6/15]
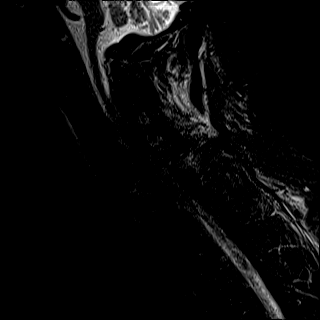
[im 9/15]
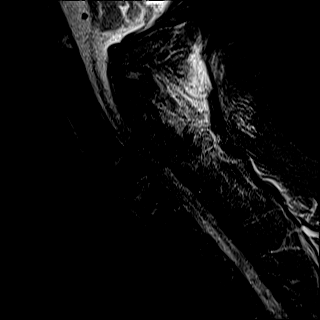
[im 12/15]
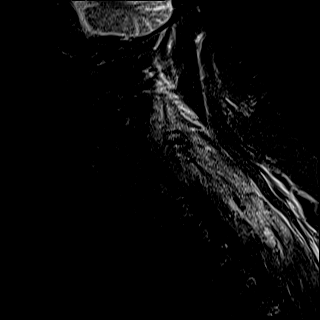
[im 15/15]
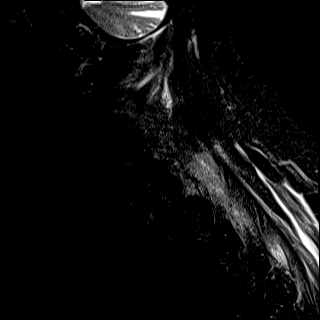

[Series 7: T2 · sagittal · 3.0mm · 0.62mm/px · 7 of 15 slices shown (1 of 2)]
[im 1/15]
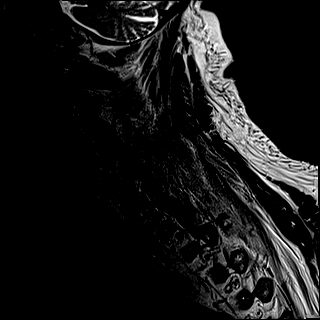
[im 3/15]
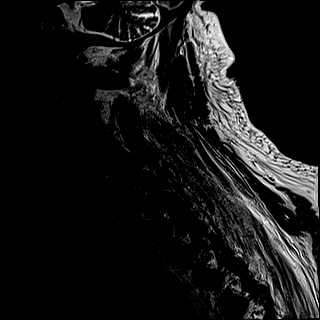
[im 5/15]
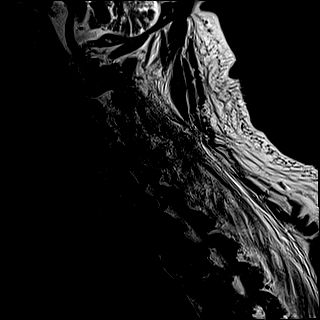
[im 8/15]
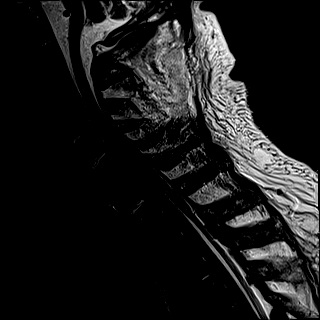
[im 10/15]
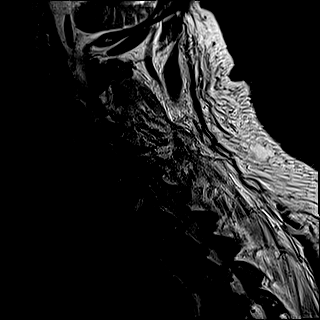
[im 12/15]
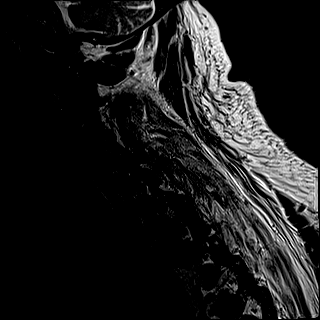
[im 15/15]
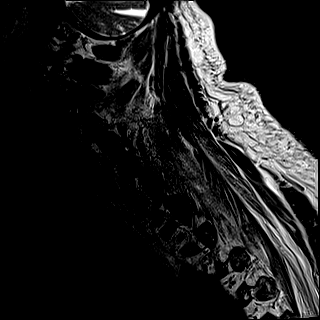

[Series 8: FLAIR · sagittal · 3.0mm · 0.78mm/px · 7 of 15 slices shown]
[im 1/15]
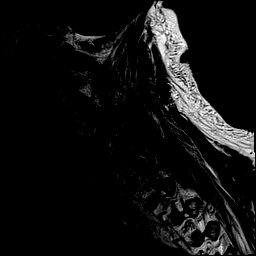
[im 3/15]
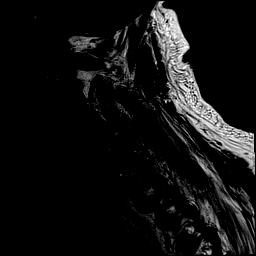
[im 5/15]
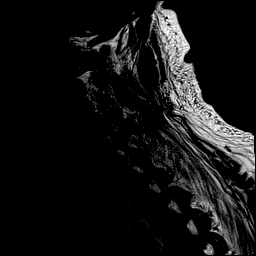
[im 8/15]
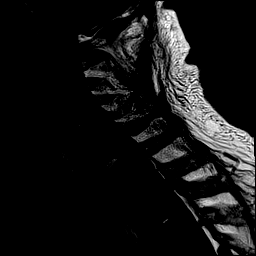
[im 10/15]
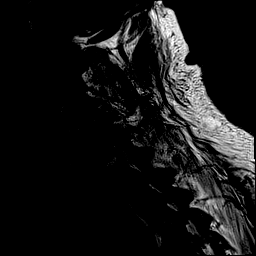
[im 12/15]
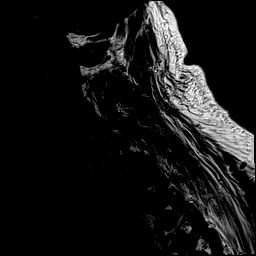
[im 15/15]
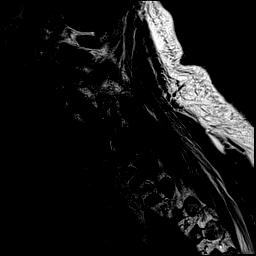

[Series 9: T2 · axial · 3.0mm · 0.70mm/px · z∈[-29,+63]mm · 10 of 32 slices shown (2 of 2)]
[im 1/32]
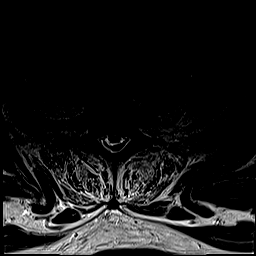
[im 3/32]
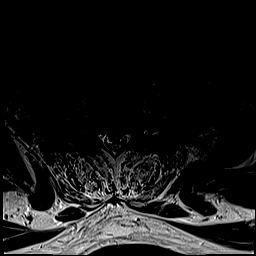
[im 5/32]
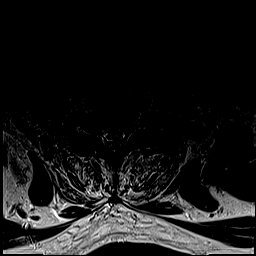
[im 8/32]
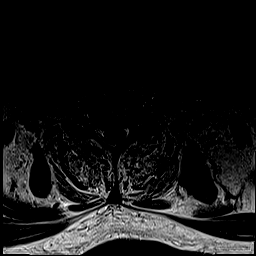
[im 10/32]
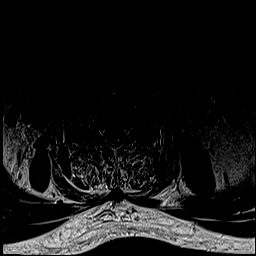
[im 15/32]
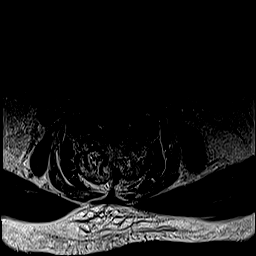
[im 17/32]
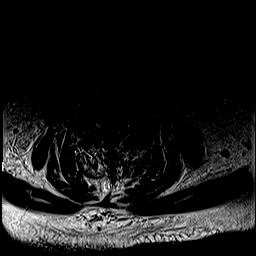
[im 22/32]
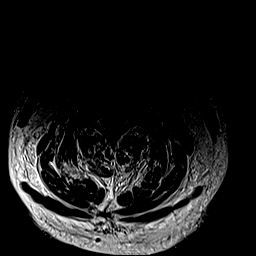
[im 27/32]
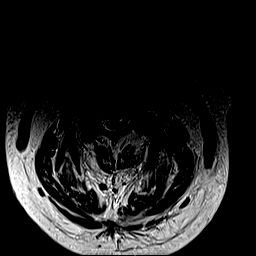
[im 32/32]
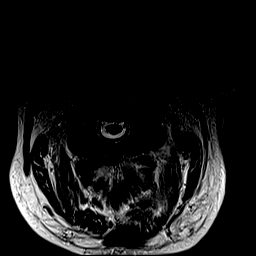

[Series 10: ax mpgr · axial · 3.0mm · 0.35mm/px · z∈[-29,+63]mm · 8 of 32 slices shown]
[im 1/32]
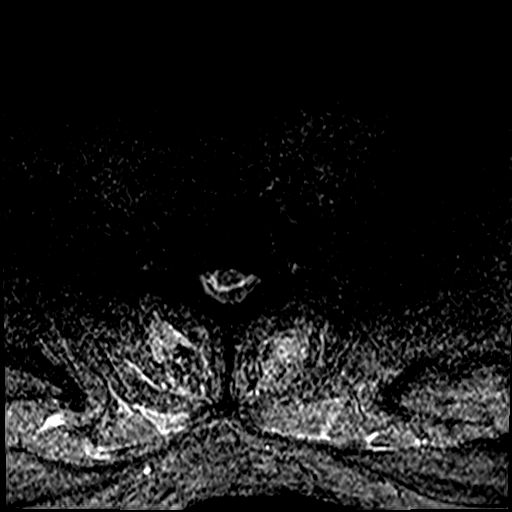
[im 5/32]
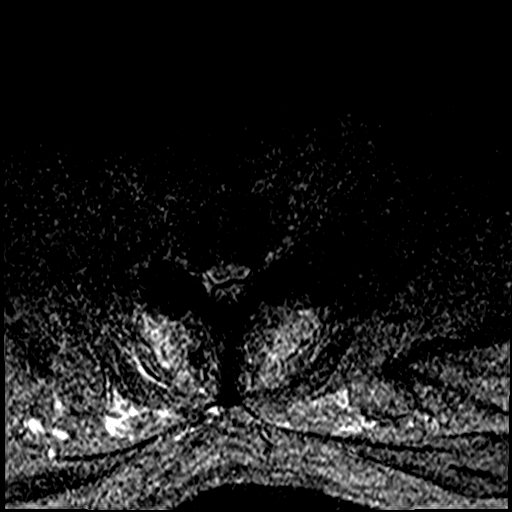
[im 10/32]
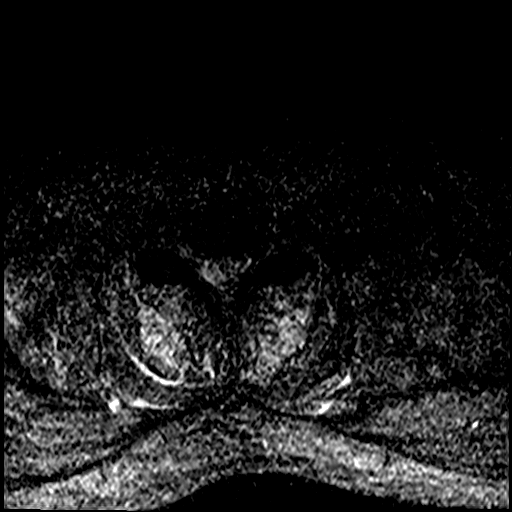
[im 15/32]
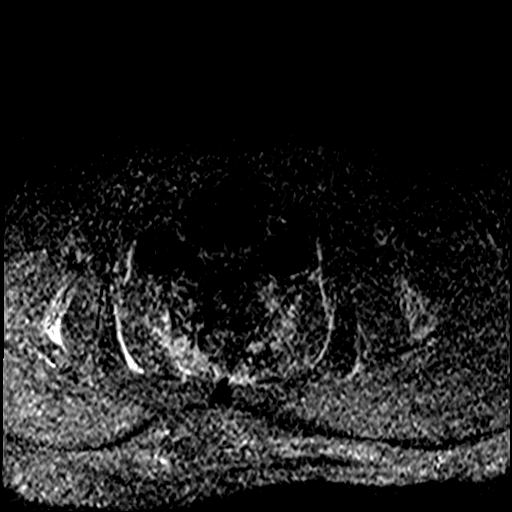
[im 17/32]
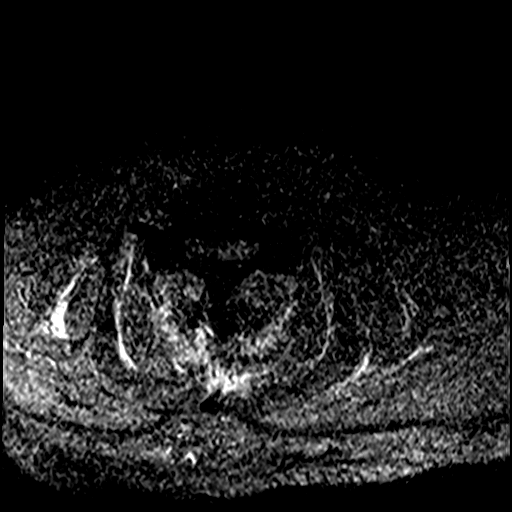
[im 22/32]
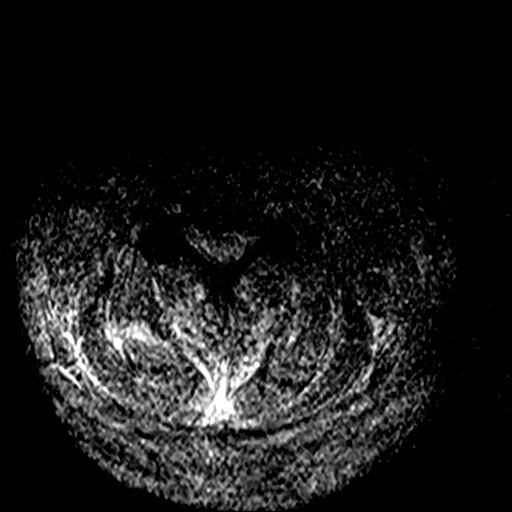
[im 27/32]
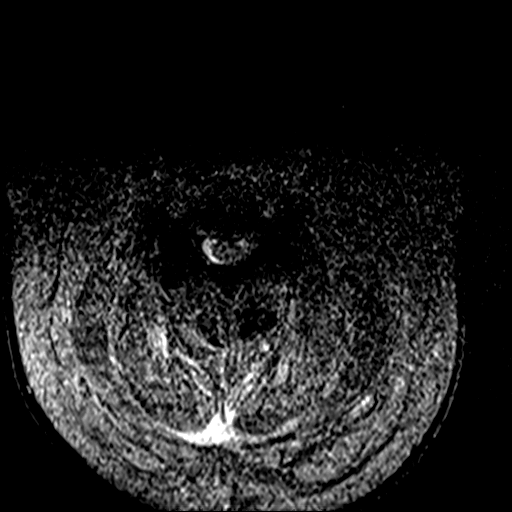
[im 32/32]
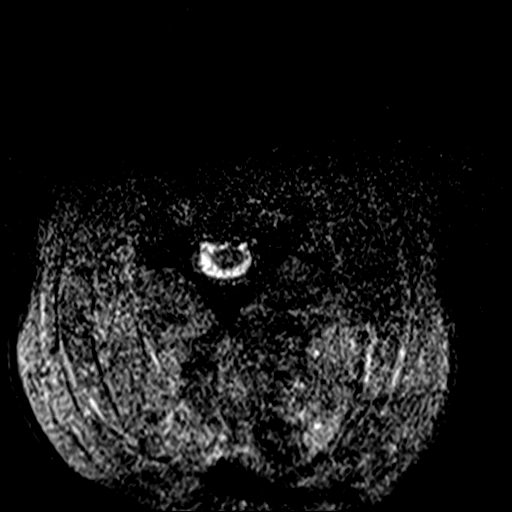

[38 of 48 positions shown; findings below may reference images not displayed]

FINDINGS: Alignment: Less reversal of cervical lordosis now.

Vertebrae: Abnormal marrow edema in the C6 superior endplate,
associated with heterogeneity in the adjacent C5-C6 disc space. In
conjunction with the abnormal CT appearance here 2 days ago, and
also on the thoracic spine CT today, this is consistent with acute
fracture through the ankylosed spine.

Underlying spinal ankylosis better demonstrated by CT. No other
marrow edema or acute osseous abnormality.

Cord: Spinal stenosis and at least mild spinal cord compression from
a long segment heterogeneous dorsal epidural hemorrhage from the
C4-C5 through C7-T1 levels. See series 7, image 8 and series 6,
image 8 and series 9, image 30. No definite associated cord signal
abnormality.

Posterior Fossa, vertebral arteries, paraspinal tissues: Severe
midline suboccipital and bilateral paraspinal muscle edema.
Associated small focal posterior paraspinal hematoma or seroma at
the T2 level on series 6, image 3.

Cervicomedullary junction is within normal limits. Negative visible
posterior fossa. Preserved major vascular flow voids in the neck.

Disc levels:

Posttraumatic spinal stenosis from the C4 to the C7 level related to
dorsal epidural hemorrhage, up to 6 or 7 mm in thickness.
IMPRESSION: 1. Acute C5-C6 fracture through the ankylosed spine, corresponding
to the abnormality on earlier Thoracic and Cervical CTs.
No significant spondylolisthesis, but associated dorsal epidural
hemorrhage resulting in spinal stenosis with spinal cord mass effect
from C4 to C7. No definite cord contusion or edema.

2. Extensive midline suboccipital and bilateral paraspinal muscle
edema/injury.

Preliminary report of this exam was discussed by telephone with
Neurosurgery Dr. UMAT on [DATE] at [XB] hours.

## 2021-06-12 IMAGING — CT CT T SPINE W/O CM
3 of 4 series · 12 of 35 positions shown, 14 images · non-contrast
Comparison: Lumbar MRI today reported separately.
COMPARISON: Lumbar MRI today reported separately.

Addendum:
CLINICAL DATA: 75 year old male with low back pain after a fall,
and fracture the ankylosed spine at the L1 level on MRI this
morning.

EXAM:
CT THORACIC SPINE WITHOUT CONTRAST
TECHNIQUE: Multidetector CT images of the thoracic were obtained using the
standard protocol without intravenous contrast.

[Series 7: sagittal bone · sagittal · 0.52mm/px · 5 of 163 slices shown, 6 images]
[im 55/163  bone]
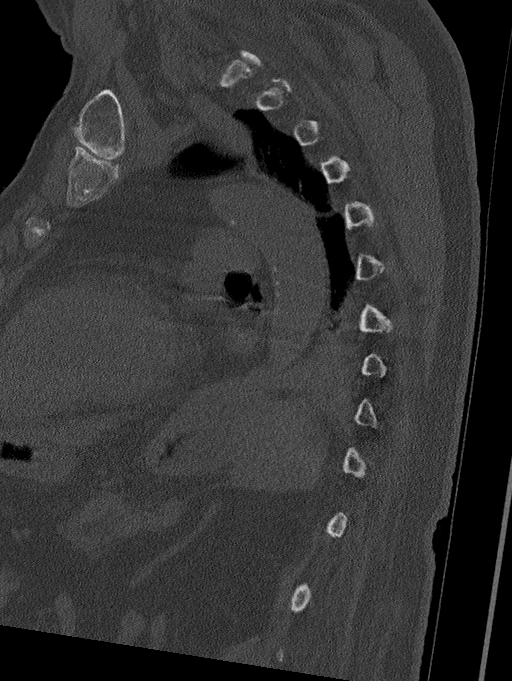
[im 68/163  bone]
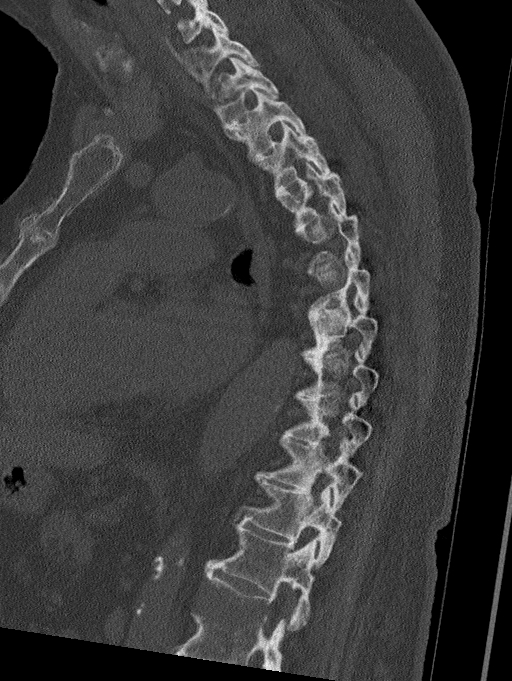
[im 82/163  soft-tissue]
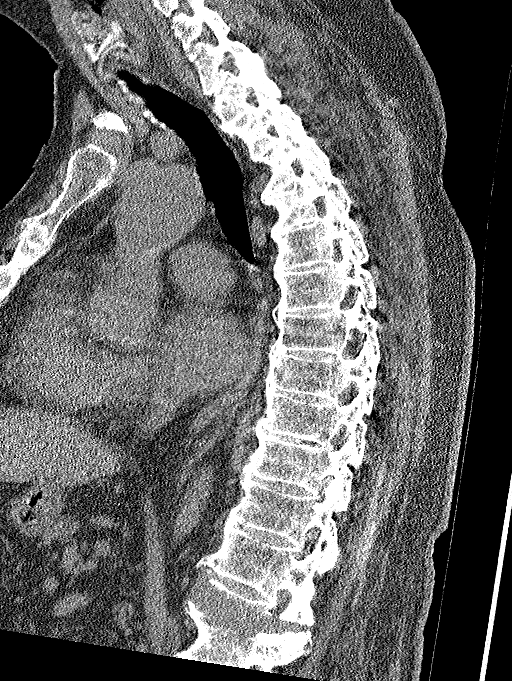
[im 82/163  bone]
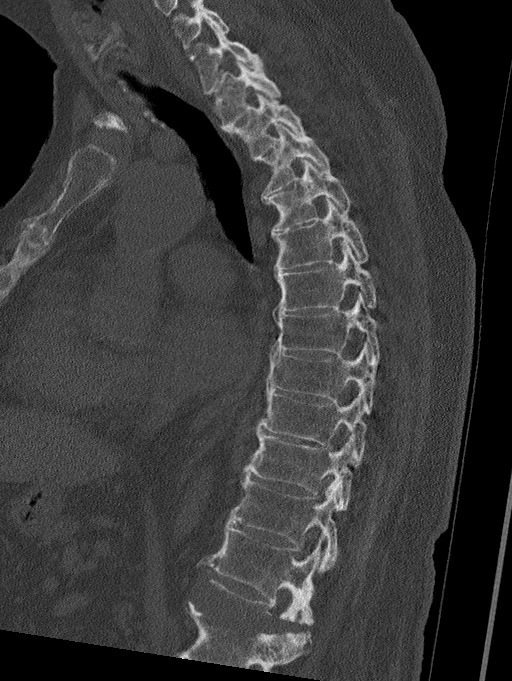
[im 95/163  bone]
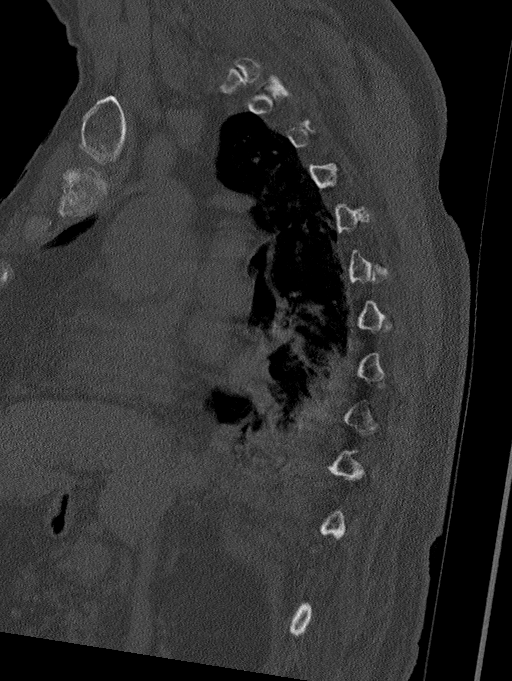
[im 109/163  bone]
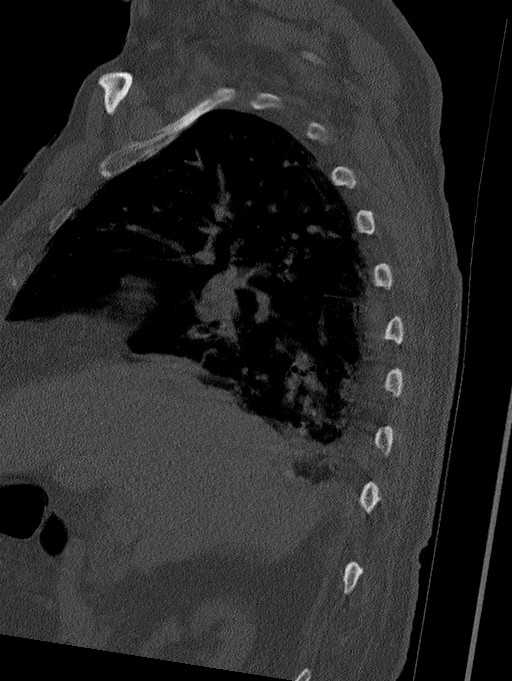

[Series 8: coronal bone · coronal · 0.60mm/px · 3 of 168 slices shown]
[im 34/168  bone]
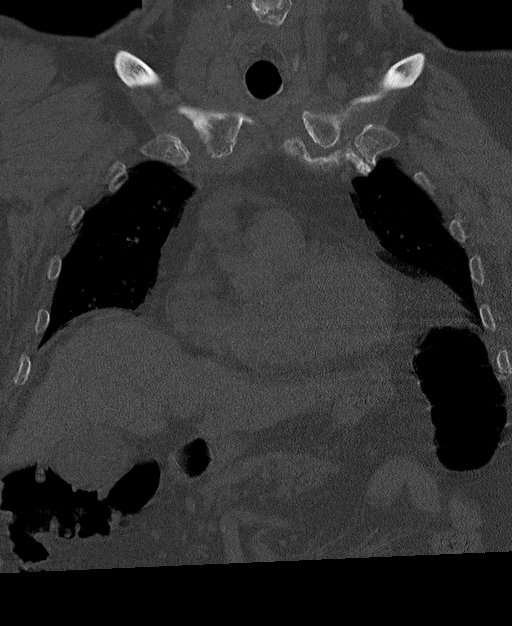
[im 67/168  bone]
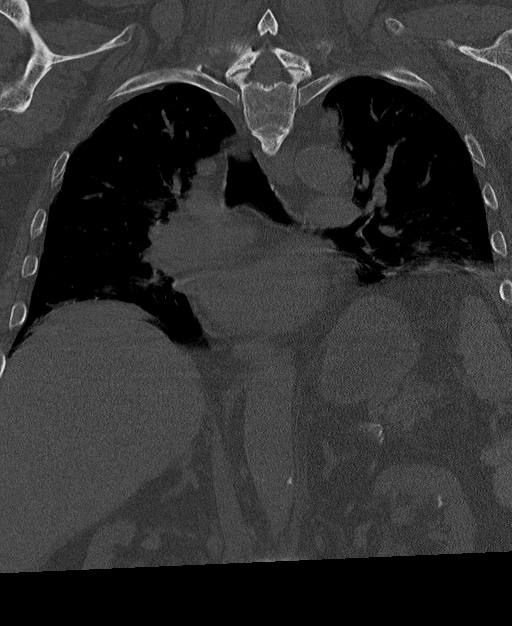
[im 101/168  bone]
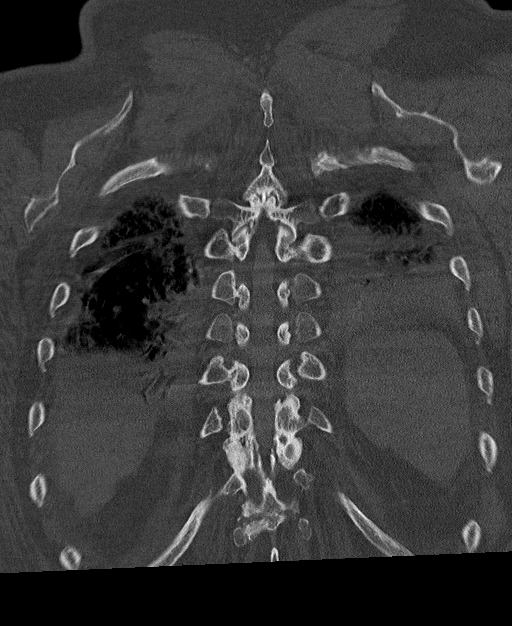

[Series 10: multidisc · axial · 0.27mm/px · z∈[-747,-550]mm · 4 of 198 slices shown, 5 images]
[im 33/198  soft-tissue]
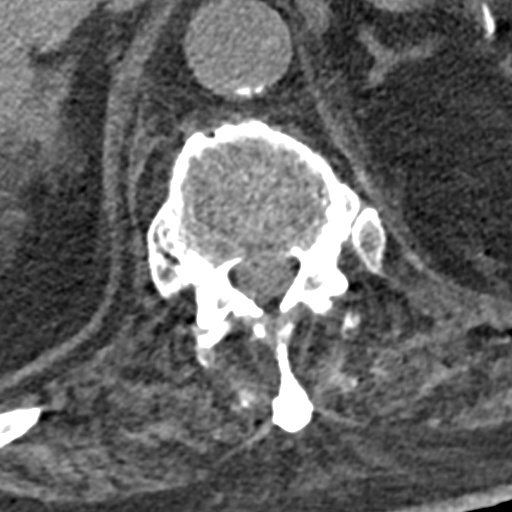
[im 33/198  bone]
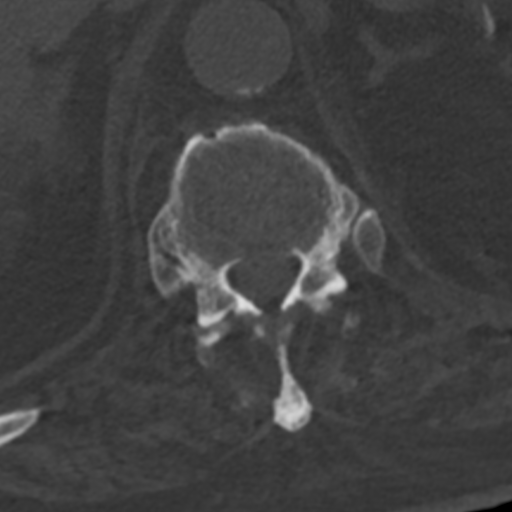
[im 66/198  bone]
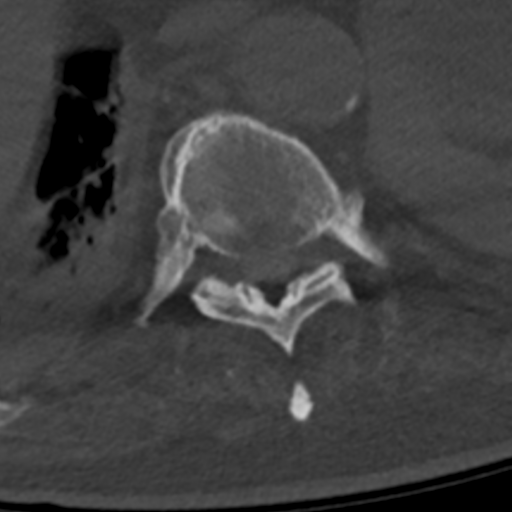
[im 132/198  bone]
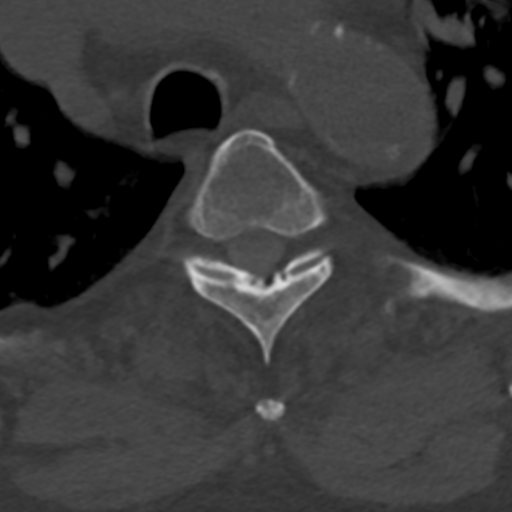
[im 165/198  bone]
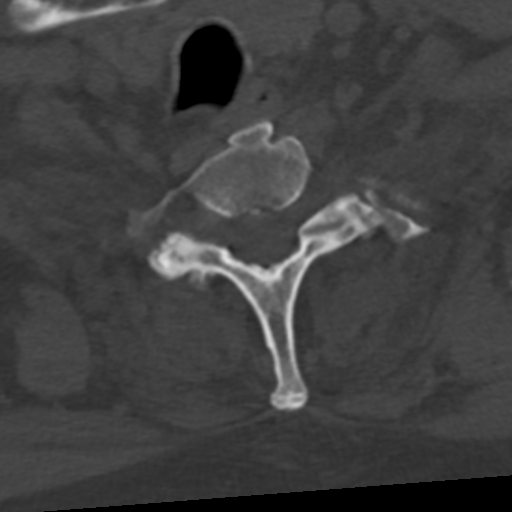

[12 of 35 positions shown; findings below may reference images not displayed]

FINDINGS: Limited cervical spine imaging: Partially visible interruption of
flowing anterior endplate osteophytes in the anterior cervical spine
at C5-C6. However, no obvious disruption of the partially visible
posterior elements at that level. Dedicated cervical spine CT would
be valuable.

Thoracic spine segmentation: Normal, concordant with the lumbar
numbering on the earlier MRI.

Alignment: Mildly exaggerated thoracic kyphosis. No
spondylolisthesis.

Vertebrae: Ankylosis from the lower cervical spine through T12 from
flowing endplate syndesmophytes. Intermittent bilateral
costovertebral junction ankylosis also. Osteopenia. No thoracic
spine fracture identified above T12. Visible posterior ribs appear
intact.

T12-L1 level fracture as described on the lumbar spine MRI earlier
today. Associated disruption of the right facets at that level, and
distracted fracture fragments with subtle anterolisthesis.

Paraspinal and other soft tissues: Calcified aortic atherosclerosis.
Mild respiratory motion. Small bilateral layering pleural effusions.
Associated lung base atelectasis. Visible major airways are patent.
No pericardial effusion is evident. Negative visible noncontrast
upper abdominal viscera

Upper lumbar paraspinal muscle atrophy.

Disc levels:

Mild for age thoracic spine degeneration. No CT evidence of thoracic
spinal stenosis.
IMPRESSION: 1. Questionable fracture through flowing anterior endplate
osteophytes at C5-C6 in the lower cervical spine. Noncontrast
Cervical Spine CT (rather than MRI) would be most valuable to
further characterize.

2. Diffuse spinal ankylosis with superior L1 level fracture
redemonstrated. No superimposed thoracic spine fracture.

3. Small bilateral layering pleural effusions with lung base
atelectasis.

4. Aortic Atherosclerosis ([IG]-[IG]).

ADDENDUM:
Study discussed by telephone with neurosurgery Dr. SALOM
on [DATE] at [IG] hours.

He alerted me to the fact that a CT Cervical Spine was already done
two days ago on [DATE], and was suspicious for C5-C6 level
fracture at that time. Follow-up Cervical Spine MRI is pending, but
the early images from that exam confirm acute injury (please see
that report) - which Dr. SALOM and I discussed.

*** End of Addendum ***
FINDINGS: Limited cervical spine imaging: Partially visible interruption of
flowing anterior endplate osteophytes in the anterior cervical spine
at C5-C6. However, no obvious disruption of the partially visible
posterior elements at that level. Dedicated cervical spine CT would
be valuable.

Thoracic spine segmentation: Normal, concordant with the lumbar
numbering on the earlier MRI.

Alignment: Mildly exaggerated thoracic kyphosis. No
spondylolisthesis.

Vertebrae: Ankylosis from the lower cervical spine through T12 from
flowing endplate syndesmophytes. Intermittent bilateral
costovertebral junction ankylosis also. Osteopenia. No thoracic
spine fracture identified above T12. Visible posterior ribs appear
intact.

T12-L1 level fracture as described on the lumbar spine MRI earlier
today. Associated disruption of the right facets at that level, and
distracted fracture fragments with subtle anterolisthesis.

Paraspinal and other soft tissues: Calcified aortic atherosclerosis.
Mild respiratory motion. Small bilateral layering pleural effusions.
Associated lung base atelectasis. Visible major airways are patent.
No pericardial effusion is evident. Negative visible noncontrast
upper abdominal viscera

Upper lumbar paraspinal muscle atrophy.

Disc levels:

Mild for age thoracic spine degeneration. No CT evidence of thoracic
spinal stenosis.
IMPRESSION: 1. Questionable fracture through flowing anterior endplate
osteophytes at C5-C6 in the lower cervical spine. Noncontrast
Cervical Spine CT (rather than MRI) would be most valuable to
further characterize.

2. Diffuse spinal ankylosis with superior L1 level fracture
redemonstrated. No superimposed thoracic spine fracture.

3. Small bilateral layering pleural effusions with lung base
atelectasis.

4. Aortic Atherosclerosis ([IG]-[IG]).

## 2021-06-12 IMAGING — RF DG LUMBAR SPINE 2-3V
1 series · 4 of 4 positions shown · non-contrast
Comparison: Preoperative imaging.

CLINICAL DATA: Laminectomy with posterior-lateral arthrodesis.
Posterior fusion 4 levels.

EXAM:
LUMBAR SPINE - 2-3 VIEW; DG C-ARM 1-60 MIN

[Series 1: dg x-ray · 0.20mm/px · 4 of 4 slices shown]
[im 1/4]
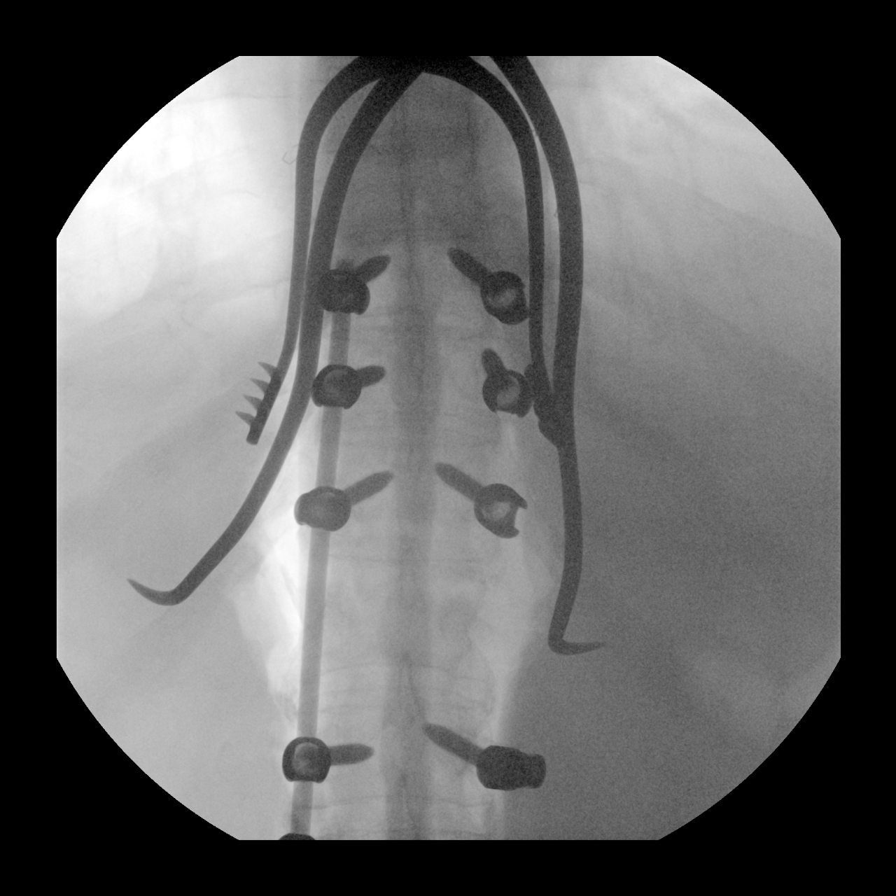
[im 2/4]
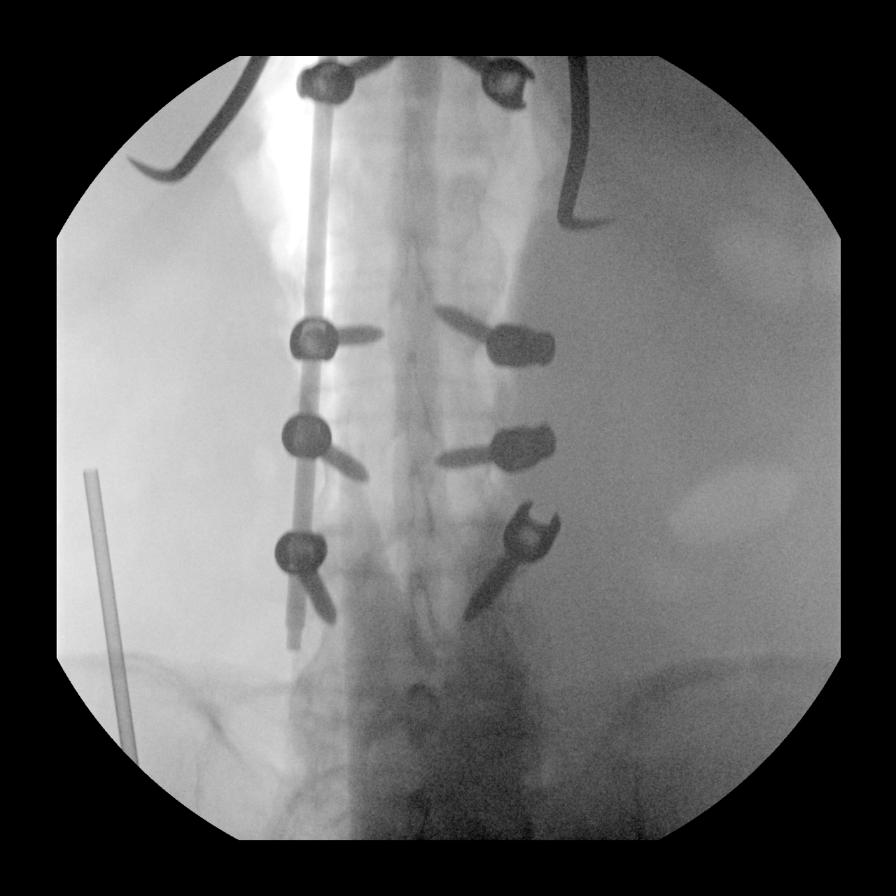
[im 3/4]
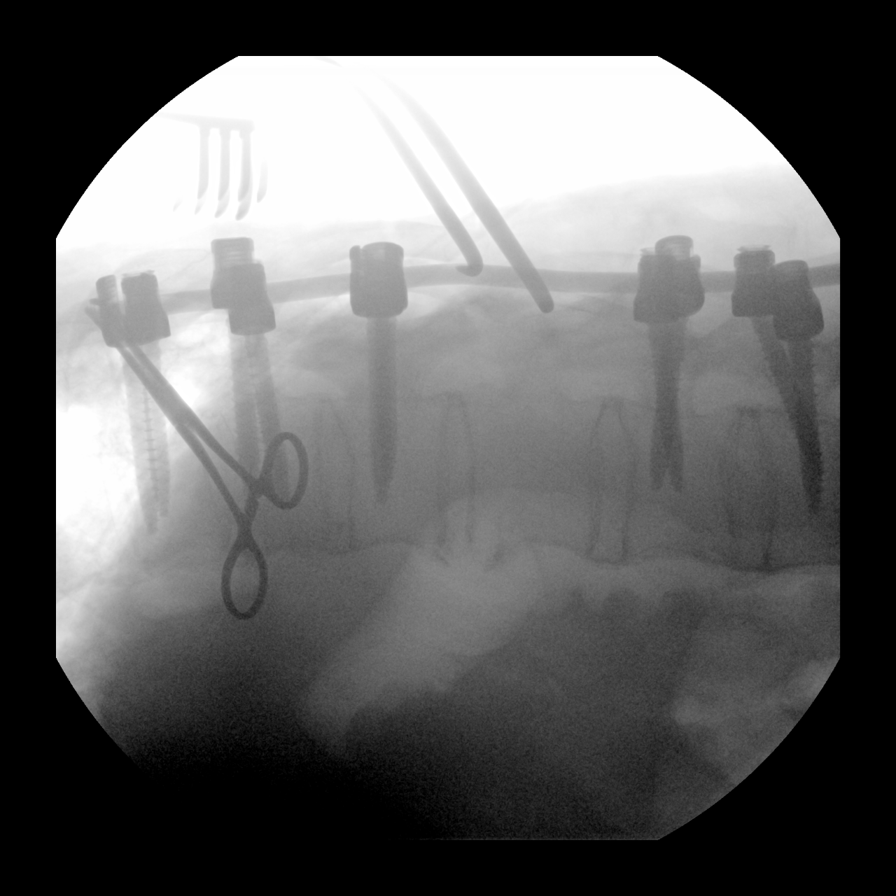
[im 4/4]
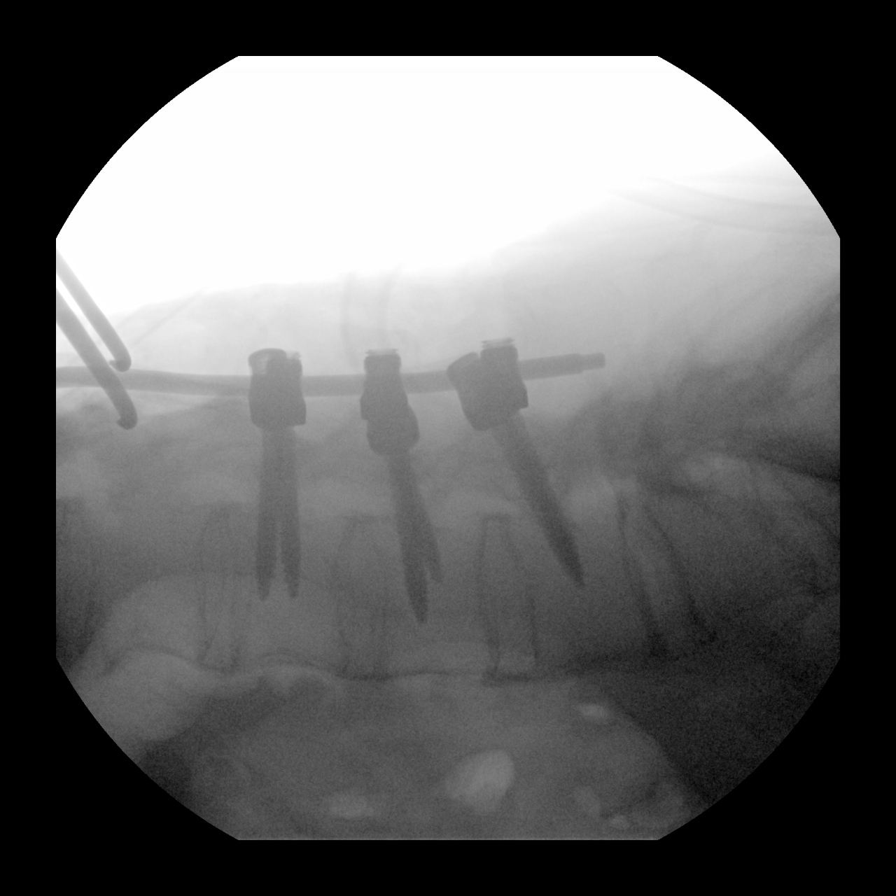

[4 of 4 positions shown; findings below may reference images not displayed]

FINDINGS: Four fluoroscopic spot views of the lumbar spine obtained in the
operating room. Pedicle screws at T10, T11, T12, L2, L3, and L4.
Posterior rod is demonstrated on the right on provided images.
Fluoroscopy time 12 seconds.
IMPRESSION: Fluoroscopic spot views during spinal fusion.

## 2021-06-12 IMAGING — CT CT L SPINE W/O CM
3 of 4 series · 15 of 33 positions shown, 17 images · non-contrast
Comparison: Thoracic spine CT today.  Lumbar MRI [WG] hours today.

CLINICAL DATA: 75 year old male with low back pain after a fall,
and fracture the ankylosed spine at the L1 level on MRI this
morning.

EXAM:
CT LUMBAR SPINE WITHOUT CONTRAST
TECHNIQUE: Multidetector CT imaging of the lumbar spine was performed without
intravenous contrast administration. Multiplanar CT image
reconstructions were also generated.

[Series 6: l spine soft · axial · 0.45mm/px · z∈[-999,-809]mm · 7 of 113 slices shown, 9 images]
[im 9/113  soft-tissue]
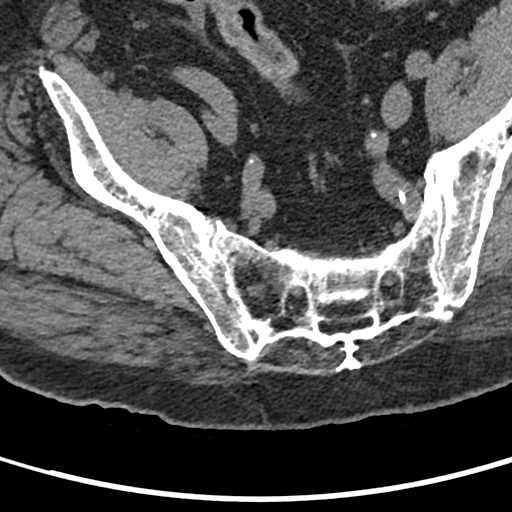
[im 9/113  bone]
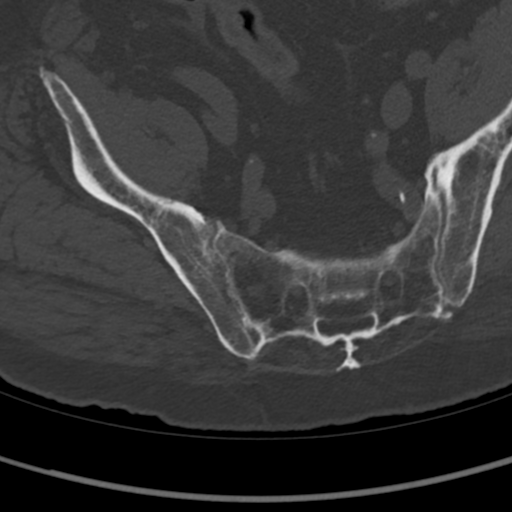
[im 26/113  bone]
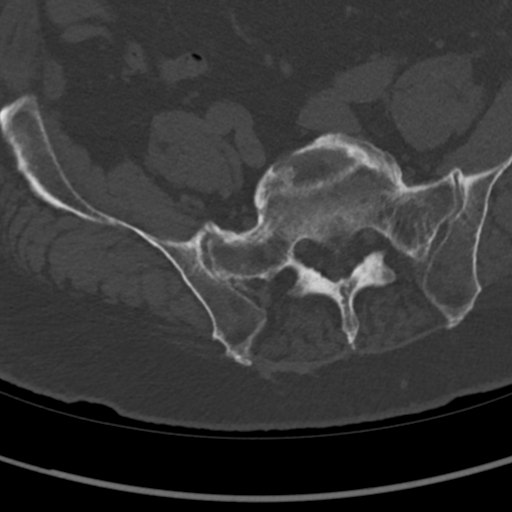
[im 44/113  bone]
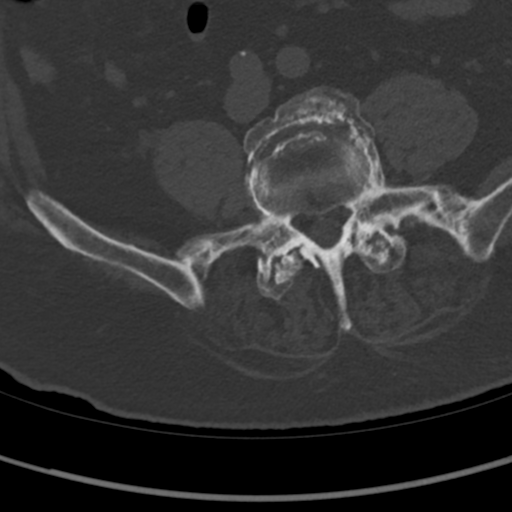
[im 61/113  bone]
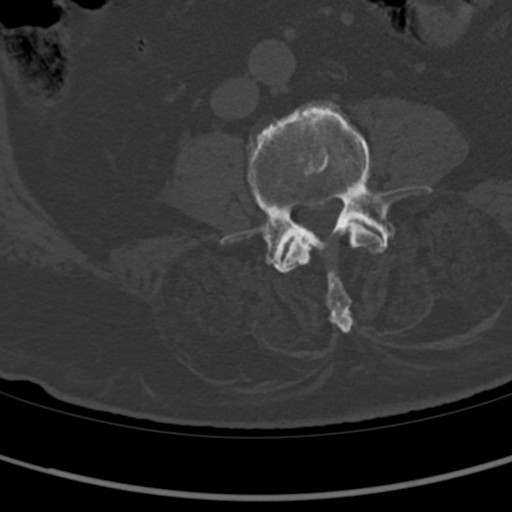
[im 69/113  soft-tissue]
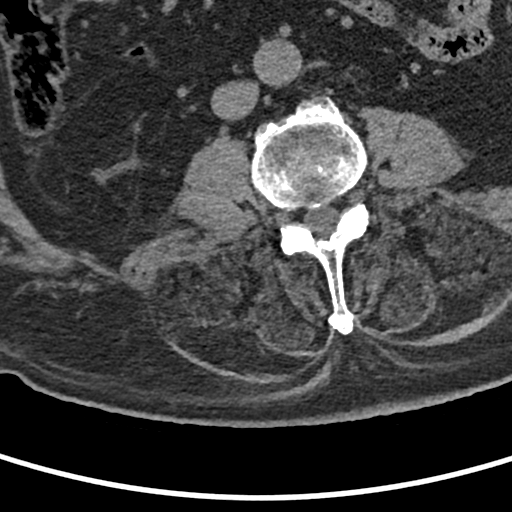
[im 69/113  bone]
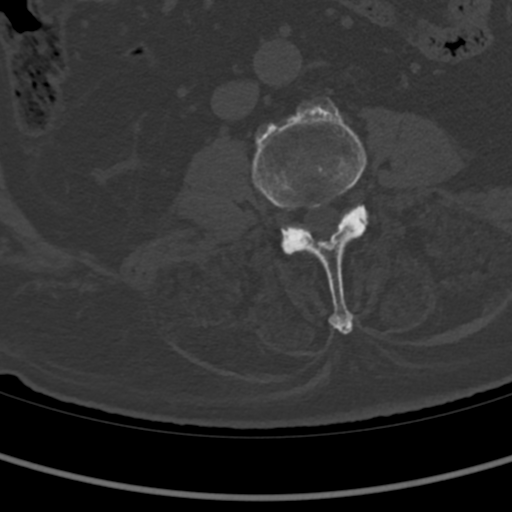
[im 87/113  bone]
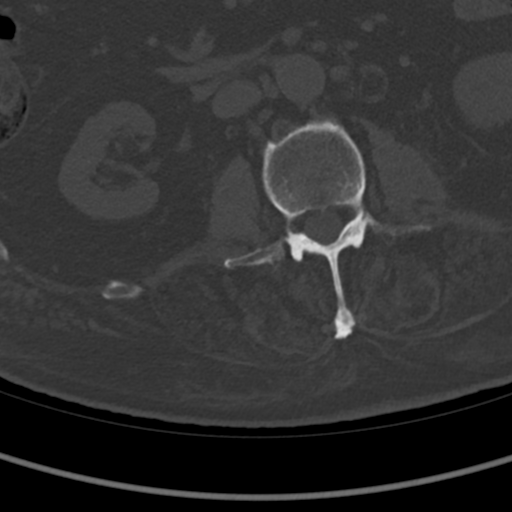
[im 104/113  bone]
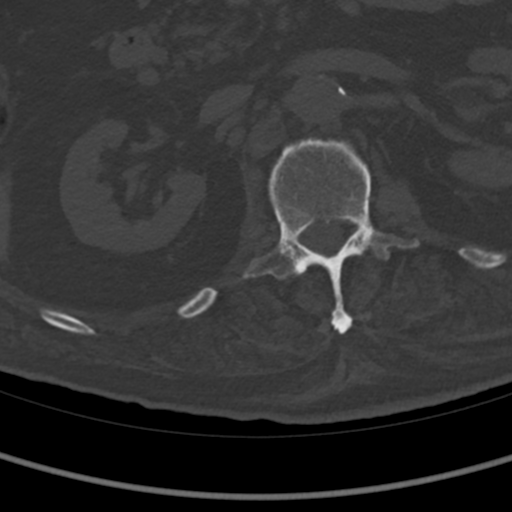

[Series 8: sagittal st · sagittal · 0.40mm/px · 5 of 96 slices shown]
[im 16/96  bone]
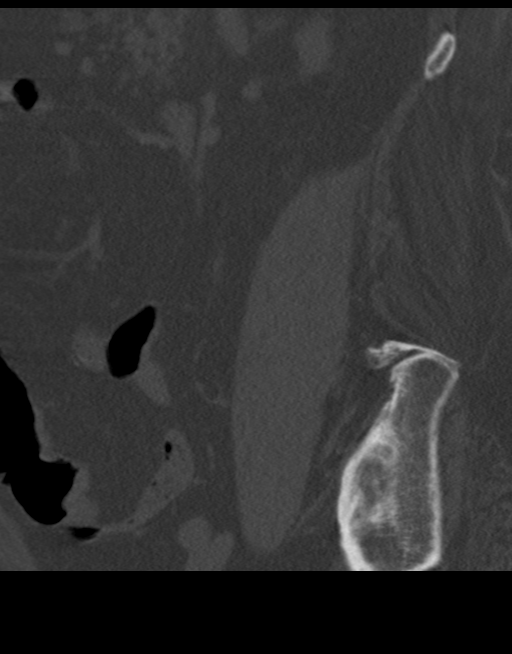
[im 32/96  bone]
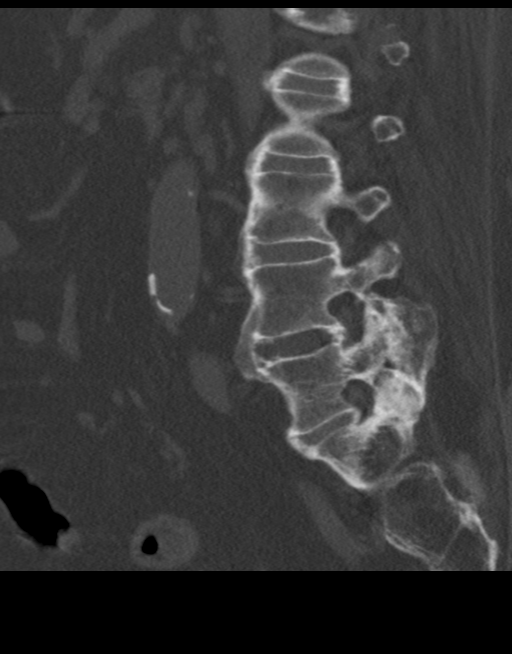
[im 48/96  bone]
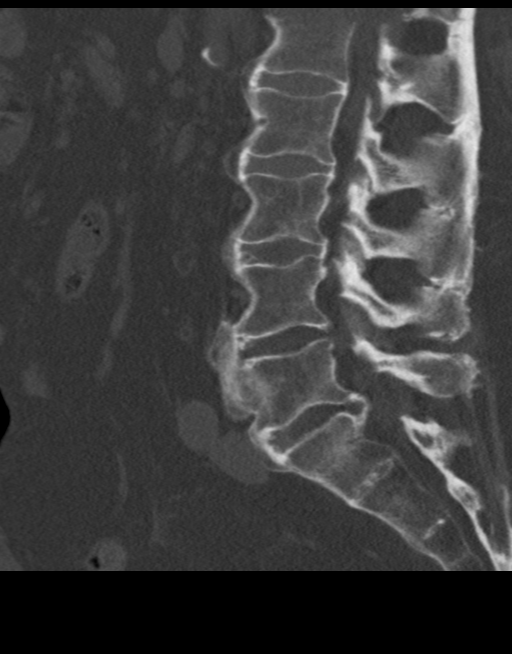
[im 64/96  bone]
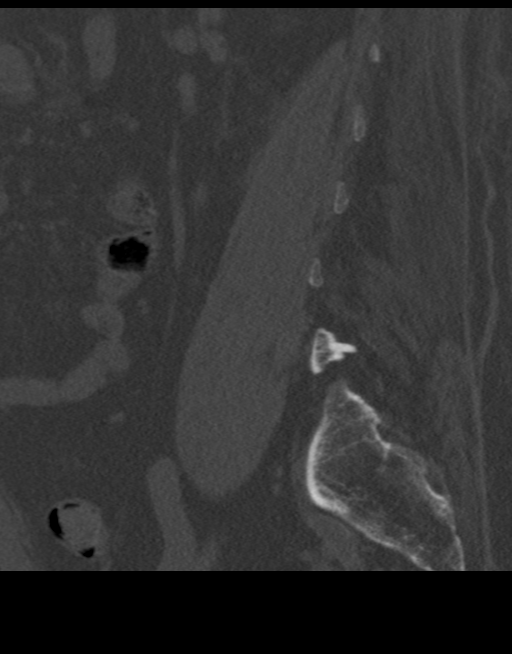
[im 80/96  bone]
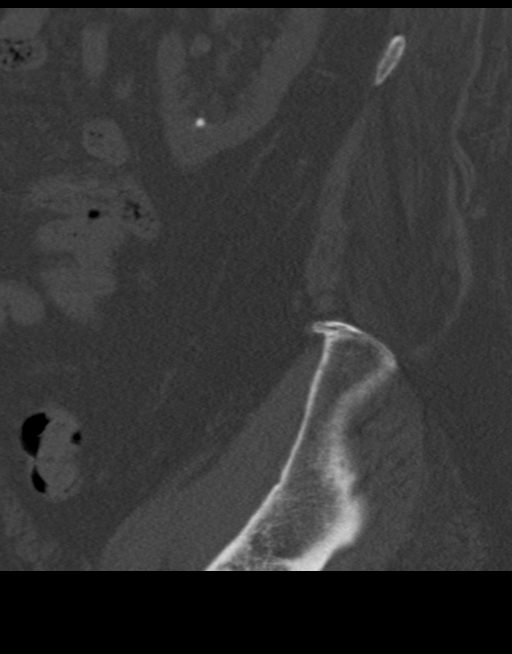

[Series 9: coronal bone · coronal · 0.46mm/px · 3 of 162 slices shown]
[im 33/162  bone]
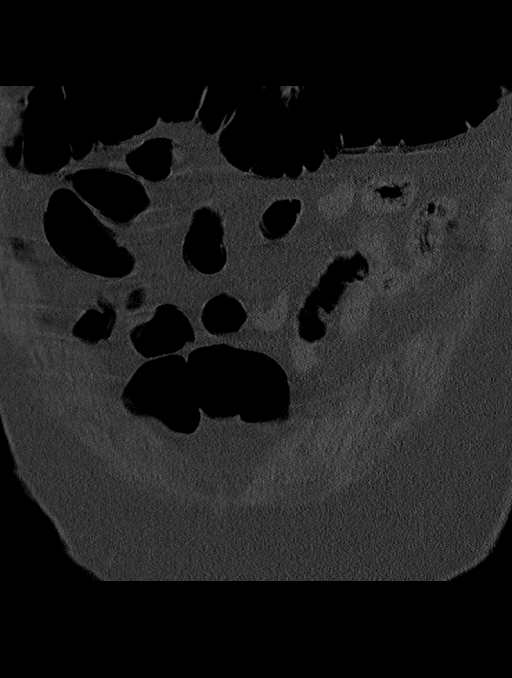
[im 65/162  bone]
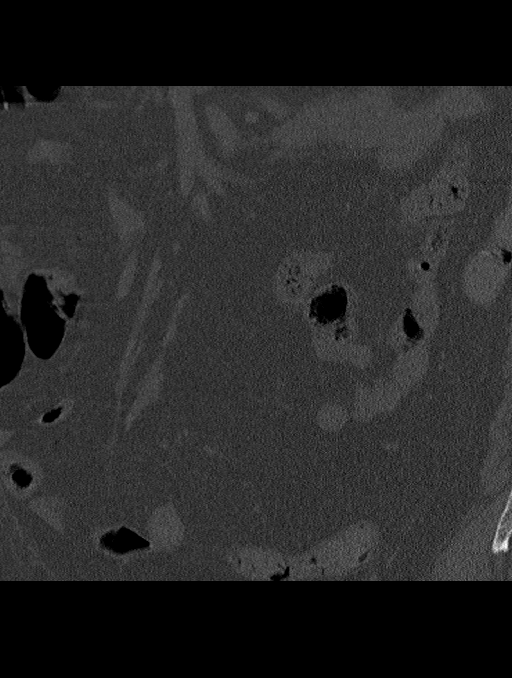
[im 97/162  bone]
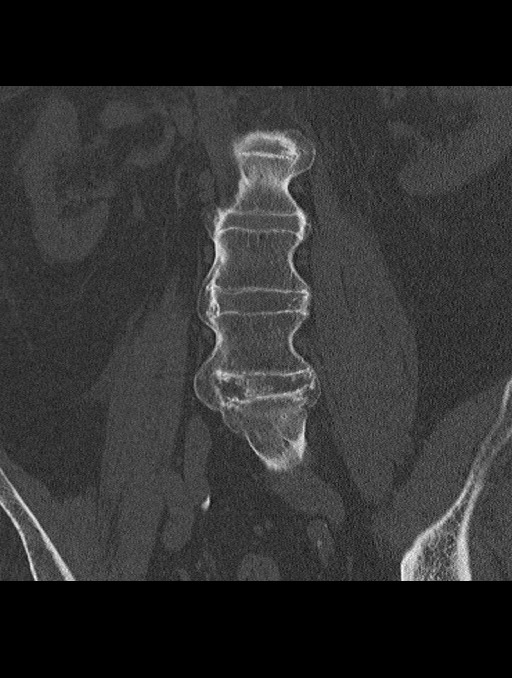

[15 of 33 positions shown; findings below may reference images not displayed]

FINDINGS: Segmentation: Normal, concordant with the other thoracic and lumbar
numbering today.

Alignment: Relatively preserved lumbar lordosis.

Vertebrae: Osteopenia. Incomplete ankylosis of the bilateral SI
joints, but flowing endplate osteophytes throughout the lumbar spine
resulting in ankylosis, bulky at L4-L5.

Partially visible distracted horizontal fracture through the
superior L1 level, unchanged from the earlier MRI.

Elsewhere the lumbar vertebrae are intact.  Intact visible sacrum.

Paraspinal and other soft tissues: Atrophied posterior lumbar
paraspinal muscles. Left nephrolithiasis. Aortoiliac calcified
atherosclerosis. Negative visible other noncontrast abdominal
viscera. Normal appendix is visible.

Disc levels: Unchanged from the MRI earlier today.
IMPRESSION: 1. Distracted, horizontal fracture through the superior L1 level as
seen on the earlier MRI (and more included on the Thoracic Spine CT
reported separately).

2. Underlying spinal ankylosis and osteopenia. No other acute
osseous abnormality in the lumbar spine. Incomplete SI joint
ankylosis.

3. Left nephrolithiasis.  Aortic Atherosclerosis ([WG]-[WG]).

## 2021-06-12 SURGERY — POSTERIOR THORACIC FUSION 4 LEVELS
Anesthesia: General

## 2021-06-12 MED ORDER — SODIUM CHLORIDE 0.9 % IV SOLN: INTRAVENOUS | Status: DC

## 2021-06-12 MED ORDER — PROPOFOL 500 MG/50ML IV EMUL
INTRAVENOUS | Status: DC | PRN
  Administered 2021-06-12: 20 ug/kg/min via INTRAVENOUS

## 2021-06-12 MED ORDER — ACETAMINOPHEN 10 MG/ML IV SOLN
INTRAVENOUS | Status: DC | PRN
  Administered 2021-06-12: 1000 mg via INTRAVENOUS

## 2021-06-12 MED ORDER — CEFAZOLIN SODIUM-DEXTROSE 2-4 GM/100ML-% IV SOLN
2.0000 g | INTRAVENOUS | Status: AC
  Administered 2021-06-12: 2 g via INTRAVENOUS

## 2021-06-12 MED ORDER — ONDANSETRON HCL 4 MG/2ML IJ SOLN: 4.0000 mg | Freq: Three times a day (TID) | INTRAMUSCULAR | Status: DC | PRN

## 2021-06-12 MED ORDER — MORPHINE SULFATE (PF) 4 MG/ML IV SOLN
4.0000 mg | Freq: Once | INTRAVENOUS | Status: AC
  Administered 2021-06-12: 4 mg via INTRAVENOUS
  Filled 2021-06-12: qty 1

## 2021-06-12 MED ORDER — PHENYLEPHRINE HCL-NACL 20-0.9 MG/250ML-% IV SOLN
INTRAVENOUS | Status: AC
  Filled 2021-06-12: qty 250

## 2021-06-12 MED ORDER — ACETAMINOPHEN 325 MG PO TABS: 650.0000 mg | ORAL_TABLET | Freq: Four times a day (QID) | ORAL | Status: DC | PRN

## 2021-06-12 MED ORDER — PHENYLEPHRINE HCL-NACL 20-0.9 MG/250ML-% IV SOLN
25.0000 ug/min | INTRAVENOUS | Status: DC
Start: 2021-06-12 — End: 2021-06-13
  Administered 2021-06-12: 50 ug/min via INTRAVENOUS
  Administered 2021-06-13: 40 ug/min via INTRAVENOUS
  Administered 2021-06-13: 70 ug/min via INTRAVENOUS
  Filled 2021-06-12 (×2): qty 250

## 2021-06-12 MED ORDER — REMIFENTANIL HCL 1 MG IV SOLR
INTRAVENOUS | Status: AC
  Filled 2021-06-12: qty 1000

## 2021-06-12 MED ORDER — ONDANSETRON HCL 4 MG/2ML IJ SOLN
INTRAMUSCULAR | Status: DC | PRN
  Administered 2021-06-12: 4 mg via INTRAVENOUS

## 2021-06-12 MED ORDER — CEFAZOLIN (ANCEF) 1 G IV SOLR: 2.0000 g | INTRAVENOUS | Status: DC

## 2021-06-12 MED ORDER — ALBUMIN HUMAN 5 % IV SOLN
INTRAVENOUS | Status: AC
  Filled 2021-06-12: qty 250

## 2021-06-12 MED ORDER — LIDOCAINE HCL (PF) 2 % IJ SOLN
INTRAMUSCULAR | Status: AC
  Filled 2021-06-12: qty 5

## 2021-06-12 MED ORDER — IBUPROFEN 800 MG PO TABS: 800.0000 mg | ORAL_TABLET | Freq: Once | ORAL | Status: DC

## 2021-06-12 MED ORDER — GLYCOPYRROLATE 0.2 MG/ML IJ SOLN
INTRAMUSCULAR | Status: AC
  Filled 2021-06-12: qty 1

## 2021-06-12 MED ORDER — CEFAZOLIN SODIUM-DEXTROSE 2-4 GM/100ML-% IV SOLN
2.0000 g | Freq: Three times a day (TID) | INTRAVENOUS | Status: DC
  Administered 2021-06-12: 2 g via INTRAVENOUS

## 2021-06-12 MED ORDER — PHENYLEPHRINE HCL-NACL 20-0.9 MG/250ML-% IV SOLN: 0.0000 ug/min | INTRAVENOUS | Status: DC

## 2021-06-12 MED ORDER — ACETAMINOPHEN 10 MG/ML IV SOLN: 1000.0000 mg | Freq: Once | INTRAVENOUS | Status: DC | PRN

## 2021-06-12 MED ORDER — PROPOFOL 10 MG/ML IV BOLUS
INTRAVENOUS | Status: AC
  Filled 2021-06-12: qty 20

## 2021-06-12 MED ORDER — OXYCODONE-ACETAMINOPHEN 5-325 MG PO TABS: 1.0000 | ORAL_TABLET | ORAL | Status: DC | PRN

## 2021-06-12 MED ORDER — DEXAMETHASONE SODIUM PHOSPHATE 10 MG/ML IJ SOLN
INTRAMUSCULAR | Status: AC
  Filled 2021-06-12: qty 1

## 2021-06-12 MED ORDER — ONDANSETRON HCL 4 MG/2ML IJ SOLN
INTRAMUSCULAR | Status: AC
  Filled 2021-06-12: qty 2

## 2021-06-12 MED ORDER — REMIFENTANIL BOLUS VIA INFUSION OPTIME
INTRAVENOUS | Status: DC | PRN
  Administered 2021-06-12: 50 ug via INTRAVENOUS

## 2021-06-12 MED ORDER — METHOCARBAMOL 500 MG PO TABS
500.0000 mg | ORAL_TABLET | Freq: Three times a day (TID) | ORAL | Status: DC | PRN
  Filled 2021-06-12: qty 1

## 2021-06-12 MED ORDER — HYDROMORPHONE HCL 1 MG/ML IJ SOLN: 0.2500 mg | INTRAMUSCULAR | Status: DC | PRN

## 2021-06-12 MED ORDER — ONDANSETRON HCL 4 MG/2ML IJ SOLN: 4.0000 mg | Freq: Once | INTRAMUSCULAR | Status: AC

## 2021-06-12 MED ORDER — EPHEDRINE 5 MG/ML INJ
INTRAVENOUS | Status: AC
  Filled 2021-06-12: qty 5

## 2021-06-12 MED ORDER — PROTHROMBIN COMPLEX CONC HUMAN 500 UNITS IV KIT: 50.0000 [IU]/kg | PACK | Status: DC

## 2021-06-12 MED ORDER — PROPOFOL 10 MG/ML IV BOLUS
INTRAVENOUS | Status: DC | PRN
  Administered 2021-06-12: 100 mg via INTRAVENOUS

## 2021-06-12 MED ORDER — OXYCODONE HCL 5 MG PO TABS: 5.0000 mg | ORAL_TABLET | Freq: Once | ORAL | Status: DC | PRN

## 2021-06-12 MED ORDER — VASOPRESSIN 20 UNIT/ML IV SOLN
INTRAVENOUS | Status: DC | PRN
  Administered 2021-06-12: .5 [IU] via INTRAVENOUS
  Administered 2021-06-12: .25 [IU] via INTRAVENOUS
  Administered 2021-06-12 (×5): .5 [IU] via INTRAVENOUS
  Administered 2021-06-12: .25 [IU] via INTRAVENOUS
  Administered 2021-06-12: .12 [IU] via INTRAVENOUS
  Administered 2021-06-12: .25 [IU] via INTRAVENOUS
  Administered 2021-06-12: .5 [IU] via INTRAVENOUS
  Administered 2021-06-12 (×2): .25 [IU] via INTRAVENOUS
  Administered 2021-06-12: 1 [IU] via INTRAVENOUS

## 2021-06-12 MED ORDER — SUGAMMADEX SODIUM 200 MG/2ML IV SOLN
INTRAVENOUS | Status: DC | PRN
  Administered 2021-06-12: 200 mg via INTRAVENOUS

## 2021-06-12 MED ORDER — OXYCODONE HCL 5 MG/5ML PO SOLN: 5.0000 mg | Freq: Once | ORAL | Status: DC | PRN

## 2021-06-12 MED ORDER — ALBUMIN HUMAN 5 % IV SOLN: INTRAVENOUS | Status: DC | PRN

## 2021-06-12 MED ORDER — PHENYLEPHRINE HCL (PRESSORS) 10 MG/ML IV SOLN
INTRAVENOUS | Status: DC | PRN
Start: 2021-06-12 — End: 2021-06-12
  Administered 2021-06-12: 160 ug via INTRAVENOUS
  Administered 2021-06-12: 100 ug via INTRAVENOUS
  Administered 2021-06-12 (×3): 80 ug via INTRAVENOUS

## 2021-06-12 MED ORDER — FENTANYL CITRATE (PF) 100 MCG/2ML IJ SOLN
INTRAMUSCULAR | Status: DC | PRN
  Administered 2021-06-12 (×3): 50 ug via INTRAVENOUS

## 2021-06-12 MED ORDER — MORPHINE SULFATE (PF) 2 MG/ML IV SOLN
2.0000 mg | INTRAVENOUS | Status: DC | PRN
  Administered 2021-06-12 – 2021-06-13 (×3): 2 mg via INTRAVENOUS
  Filled 2021-06-12 (×3): qty 1

## 2021-06-12 MED ORDER — HYDRALAZINE HCL 20 MG/ML IJ SOLN: 5.0000 mg | INTRAMUSCULAR | Status: DC | PRN

## 2021-06-12 MED ORDER — FENTANYL CITRATE (PF) 100 MCG/2ML IJ SOLN
INTRAMUSCULAR | Status: AC
  Filled 2021-06-12: qty 2

## 2021-06-12 MED ORDER — LIDOCAINE HCL (CARDIAC) PF 100 MG/5ML IV SOSY
PREFILLED_SYRINGE | INTRAVENOUS | Status: DC | PRN
  Administered 2021-06-12: 100 mg via INTRAVENOUS

## 2021-06-12 MED ORDER — HEMOSTATIC AGENTS (NO CHARGE) OPTIME
TOPICAL | Status: DC | PRN
  Administered 2021-06-12: 3 via TOPICAL

## 2021-06-12 MED ORDER — SODIUM CHLORIDE 0.9 % IV SOLN: 250.0000 mL | INTRAVENOUS | Status: DC

## 2021-06-12 MED ORDER — ONDANSETRON HCL 4 MG/2ML IJ SOLN
INTRAMUSCULAR | Status: AC
  Administered 2021-06-12: 4 mg via INTRAVENOUS
  Filled 2021-06-12: qty 2

## 2021-06-12 MED ORDER — PROTHROMBIN COMPLEX CONC HUMAN 500 UNITS IV KIT
1593.0000 [IU] | PACK | Status: AC
  Administered 2021-06-12: 1593 [IU] via INTRAVENOUS
  Filled 2021-06-12: qty 593

## 2021-06-12 MED ORDER — DEXAMETHASONE SODIUM PHOSPHATE 10 MG/ML IJ SOLN
INTRAMUSCULAR | Status: DC | PRN
  Administered 2021-06-12: 5 mg via INTRAVENOUS

## 2021-06-12 MED ORDER — PHENYLEPHRINE HCL-NACL 20-0.9 MG/250ML-% IV SOLN
INTRAVENOUS | Status: DC | PRN
  Administered 2021-06-12: 29.067 ug/min via INTRAVENOUS

## 2021-06-12 MED ORDER — SUCCINYLCHOLINE CHLORIDE 200 MG/10ML IV SOSY
PREFILLED_SYRINGE | INTRAVENOUS | Status: DC | PRN
Start: 2021-06-12 — End: 2021-06-12
  Administered 2021-06-12: 200 mg via INTRAVENOUS

## 2021-06-12 MED ORDER — SODIUM CHLORIDE 0.9 % IR SOLN
Status: DC | PRN
  Administered 2021-06-12: 175 mL

## 2021-06-12 MED ORDER — LACTATED RINGERS IV SOLN: INTRAVENOUS | Status: DC

## 2021-06-12 MED ORDER — BUPIVACAINE-EPINEPHRINE (PF) 0.5% -1:200000 IJ SOLN
INTRAMUSCULAR | Status: DC | PRN
  Administered 2021-06-12 (×2): 10 mL

## 2021-06-12 MED ORDER — ROCURONIUM BROMIDE 100 MG/10ML IV SOLN
INTRAVENOUS | Status: DC | PRN
  Administered 2021-06-12 (×2): 20 mg via INTRAVENOUS
  Administered 2021-06-12: 55 mg via INTRAVENOUS
  Administered 2021-06-12: 20 mg via INTRAVENOUS
  Administered 2021-06-12: 5 mg via INTRAVENOUS

## 2021-06-12 MED ORDER — GLYCOPYRROLATE 0.2 MG/ML IJ SOLN
INTRAMUSCULAR | Status: DC | PRN
  Administered 2021-06-12 (×2): .2 mg via INTRAVENOUS

## 2021-06-12 MED ORDER — EPHEDRINE SULFATE 50 MG/ML IJ SOLN
INTRAMUSCULAR | Status: DC | PRN
  Administered 2021-06-12 (×5): 5 mg via INTRAVENOUS

## 2021-06-12 MED ORDER — CEFAZOLIN SODIUM-DEXTROSE 2-4 GM/100ML-% IV SOLN
INTRAVENOUS | Status: AC
  Filled 2021-06-12: qty 100

## 2021-06-12 MED ORDER — 0.9 % SODIUM CHLORIDE (POUR BTL) OPTIME
TOPICAL | Status: DC | PRN
  Administered 2021-06-12: 710 mL

## 2021-06-12 SURGICAL SUPPLY — 91 items
APPLICATOR CHLORAPREP 10 TEAL (MISCELLANEOUS) ×4 IMPLANT
BIT DRILLSCRW 3.5 (BIT) ×4 IMPLANT
BONE CANC CHIPS 20CC PCAN1/4 (Bone Implant) ×4 IMPLANT
BONE CANC CHIPS 40CC CAN1/2 (Bone Implant) ×4 IMPLANT
BULB RESERV EVAC DRAIN JP 100C (MISCELLANEOUS) ×8 IMPLANT
BUR NEURO DRILL SOFT 3.0X3.8M (BURR) ×4 IMPLANT
CAP LOCKING (Cap) ×42 IMPLANT
CAP LOCKING THREADED (Cap) ×48 IMPLANT
CHIPS CANC BONE 20CC PCAN1/4 (Bone Implant) ×3 IMPLANT
CHIPS CANC BONE 40CC CAN1/2 (Bone Implant) ×3 IMPLANT
CHLORAPREP W/TINT 26 (MISCELLANEOUS) ×16 IMPLANT
CNTNR SPEC 2.5X3XGRAD LEK (MISCELLANEOUS) ×3
CONT SPEC 4OZ STER OR WHT (MISCELLANEOUS) ×1
CONTAINER SPEC 2.5X3XGRAD LEK (MISCELLANEOUS) ×3 IMPLANT
CORD BIP STRL DISP 12FT (MISCELLANEOUS) ×4 IMPLANT
COUNTER NEEDLE 20/40 LG (NEEDLE) ×8 IMPLANT
CUP MEDICINE 2OZ PLAST GRAD ST (MISCELLANEOUS) ×4 IMPLANT
DERMABOND ADVANCED (GAUZE/BANDAGES/DRESSINGS) ×1
DERMABOND ADVANCED .7 DNX12 (GAUZE/BANDAGES/DRESSINGS) ×3 IMPLANT
DRAIN JP 10F RND SILICONE (MISCELLANEOUS) ×8 IMPLANT
DRAPE 3/4 80X56 (DRAPES) ×12 IMPLANT
DRAPE C ARM PK CFD 31 SPINE (DRAPES) ×8 IMPLANT
DRAPE INCISE IOBAN 66X45 STRL (DRAPES) ×4 IMPLANT
DRAPE LAPAROTOMY 100X77 ABD (DRAPES) ×8 IMPLANT
DRAPE MICROSCOPE SPINE 48X150 (DRAPES) IMPLANT
DRAPE SURG 17X11 SM STRL (DRAPES) ×24 IMPLANT
ELECT CAUTERY BLADE TIP 2.5 (TIP) ×4
ELECT COATED BLADE 2.86 ST (ELECTRODE) ×4 IMPLANT
ELECT EZSTD 165MM 6.5IN (MISCELLANEOUS) ×4
ELECT REM PT RETURN 9FT ADLT (ELECTROSURGICAL) ×4
ELECTRODE CAUTERY BLDE TIP 2.5 (TIP) ×3 IMPLANT
ELECTRODE EZSTD 165MM 6.5IN (MISCELLANEOUS) ×3 IMPLANT
ELECTRODE REM PT RTRN 9FT ADLT (ELECTROSURGICAL) ×3 IMPLANT
GAUZE 4X4 16PLY ~~LOC~~+RFID DBL (SPONGE) ×12 IMPLANT
GAUZE SPONGE 4X4 12PLY STRL (GAUZE/BANDAGES/DRESSINGS) ×8 IMPLANT
GLOVE SURG SYN 6.5 ES PF (GLOVE) ×4 IMPLANT
GLOVE SURG SYN 8.5  E (GLOVE) ×5
GLOVE SURG SYN 8.5 E (GLOVE) ×15 IMPLANT
GLOVE SURG UNDER POLY LF SZ6.5 (GLOVE) ×4 IMPLANT
GLOVE SURG UNDER POLY LF SZ8.5 (GLOVE) ×4 IMPLANT
GOWN SRG LRG LVL 4 IMPRV REINF (GOWNS) ×3 IMPLANT
GOWN SRG XL LVL 3 NONREINFORCE (GOWNS) ×3 IMPLANT
GOWN STRL NON-REIN TWL XL LVL3 (GOWNS) ×1
GOWN STRL REIN LRG LVL4 (GOWNS) ×1
GOWN STRL REUS W/ TWL LRG LVL3 (GOWN DISPOSABLE) ×3 IMPLANT
GOWN STRL REUS W/TWL LRG LVL3 (GOWN DISPOSABLE) ×1
GRADUATE 1200CC STRL 31836 (MISCELLANEOUS) IMPLANT
HEMOVAC 400CC 10FR (MISCELLANEOUS) ×8 IMPLANT
HOLDER FOLEY CATH W/STRAP (MISCELLANEOUS) ×4 IMPLANT
IMPL QUARTEX 3.5X14MM (Neuro Prosthesis/Implant) ×24 IMPLANT
IMPL QUARTEX 3.5X16MM (Neuro Prosthesis/Implant) ×3 IMPLANT
IMPLANT QUARTEX 3.5X14MM (Neuro Prosthesis/Implant) ×32 IMPLANT
IMPLANT QUARTEX 3.5X16MM (Neuro Prosthesis/Implant) ×4 IMPLANT
KIT SPINAL PRONEVIEW (KITS) IMPLANT
LOCKING CAP (Cap) ×56 IMPLANT
MANIFOLD NEPTUNE II (INSTRUMENTS) ×4 IMPLANT
MARKER SKIN DUAL TIP RULER LAB (MISCELLANEOUS) ×8 IMPLANT
NDL SAFETY ECLIPSE 18X1.5 (NEEDLE) ×3 IMPLANT
NEEDLE HYPO 18GX1.5 SHARP (NEEDLE) ×1
NEEDLE HYPO 22GX1.5 SAFETY (NEEDLE) ×4 IMPLANT
NS IRRIG 1000ML POUR BTL (IV SOLUTION) ×8 IMPLANT
PACK LAMINECTOMY NEURO (CUSTOM PROCEDURE TRAY) ×4 IMPLANT
PENCIL ELECTRO HAND CTR (MISCELLANEOUS) ×4 IMPLANT
PENCIL SMOKE EVACUATOR (MISCELLANEOUS) ×4 IMPLANT
PIN MAYFIELD SKULL DISP (PIN) ×4 IMPLANT
PUTTY DBX 5CC (Putty) ×4 IMPLANT
QUARTEX IMPLANT 3.5X18MM (Neuro Prosthesis/Implant) ×4 IMPLANT
ROD HEXENDED ALLOY 5.5X500MM (Rod) ×4 IMPLANT
SCREW 4.2X12MM (Screw) ×8 IMPLANT
SCREW CREO SPINAL 6.5X45 (Screw) ×48 IMPLANT
SCREW QUARTEX IMPLANT 3.5X18MM (Neuro Prosthesis/Implant) ×3 IMPLANT
SCREW SPINAL QUARTEX 5.5X30 TI (Screw) ×8 IMPLANT
SCREW SPINAL QUARTEX 5.5X34 TI (Screw) ×8 IMPLANT
SEALER BIPOLAR AQUA 6.0 (INSTRUMENTS) ×4 IMPLANT
SOLUTION PRONTOSAN WOUND 350ML (IRRIGATION / IRRIGATOR) ×8 IMPLANT
STAPLER SKIN PROX 35W (STAPLE) ×8 IMPLANT
SURGIFLO W/THROMBIN 8M KIT (HEMOSTASIS) ×12 IMPLANT
SUT DVC VLOC 3-0 CL 6 P-12 (SUTURE) IMPLANT
SUT ETHILON 3-0 FS-10 30 BLK (SUTURE) ×16
SUT VIC AB 0 CT1 18XCR BRD 8 (SUTURE) ×12 IMPLANT
SUT VIC AB 0 CT1 27 (SUTURE) ×2
SUT VIC AB 0 CT1 27XCR 8 STRN (SUTURE) ×6 IMPLANT
SUT VIC AB 0 CT1 8-18 (SUTURE) ×4
SUT VIC AB 2-0 CT1 18 (SUTURE) ×28 IMPLANT
SUTURE EHLN 3-0 FS-10 30 BLK (SUTURE) ×12 IMPLANT
SYR 30ML LL (SYRINGE) ×8 IMPLANT
SYR 3ML LL SCALE MARK (SYRINGE) IMPLANT
TOWEL OR 17X26 4PK STRL BLUE (TOWEL DISPOSABLE) ×12 IMPLANT
TUBING CONNECTING 10 (TUBING) ×12 IMPLANT
WATER STERILE IRR 500ML POUR (IV SOLUTION) ×4 IMPLANT
utility drapes with tape 89731 ×24 IMPLANT

## 2021-06-12 NOTE — Anesthesia Procedure Notes (Signed)
Arterial Line Insertion Start/End11/11/22 5:40 PM, 06-17-21 5:40 PM Performed by: Sheila Oats, MD, anesthesiologist  Patient location: OR. Preanesthetic checklist: patient identified, IV checked, site marked, monitors and equipment checked and pre-op evaluation Patient sedated radial was placed Catheter size: 20 G  Attempts: 2 Procedure performed using ultrasound guided technique. Ultrasound Notes:needle tip was noted to be adjacent to the nerve/plexus identified Following insertion, dressing applied. Patient tolerated the procedure well with no immediate complications.

## 2021-06-12 NOTE — Interval H&P Note (Signed)
History and Physical Interval Note:  07/08/21 1:21 PM  [name]  has presented today for surgery, with the diagnosis of unstable 3 colum lumbar spine fracture; lumbar spinal instability.  The various methods of treatment have been discussed with the patient and family. After consideration of risks, benefits and other options for treatment, the patient has consented to    C2-T2 posterior fusion with epidural hematoma evacuation; T10-L4 posterior fusion     as a surgical intervention.  The patient's history has been reviewed, patient examined, no change in status, stable for surgery.  I have reviewed the patient's chart and labs.  Questions were answered to the patient's satisfaction.    Heart sounds normal no MRG. Chest Clear to Auscultation Bilaterally.   [name]

## 2021-06-12 NOTE — Anesthesia Preprocedure Evaluation (Addendum)
Anesthesia Evaluation  Patient identified by MRN, date of birth, ID band Patient awake    Reviewed: Allergy & Precautions, NPO status , Patient's Chart, lab work & pertinent test results  History of Anesthesia Complications Negative for: history of anesthetic complications  Airway Mallampati: II   Neck ROM: Limited    Dental no notable dental hx. 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    Abdominal   Peds  Hematology negative hematology ROS (+)   Anesthesia Other Findings   Reproductive/Obstetrics                            Anesthesia Physical Anesthesia Plan  ASA: 4 and emergent  Anesthesia Plan: General   Post-op Pain Management:    Induction: Intravenous  PONV Risk Score and Plan: Ondansetron and Dexamethasone  Airway Management Planned: Oral ETT  Additional Equipment:   Intra-op Plan:   Post-operative Plan:   Informed Consent: I have reviewed the patients History and Physical, chart, labs and discussed the procedure including the risks, benefits and alternatives for the proposed anesthesia with the patient or authorized representative who has indicated his/her understanding and acceptance.   Patient has DNR.  Discussed DNR with patient.   Dental advisory given  Plan Discussed with: CRNA  Anesthesia Plan Comments:        Anesthesia Quick Evaluation

## 2021-06-12 NOTE — Op Note (Signed)
Indications: [name] is a 75 yo male who presented with 2 acute 3 column injuries.    He suffered a fall on November 4.  He returns to medical attention today with severe back pain and was found to have unstable injuries in the cervical and lumbar spine.  Findings: open reduction and internal fixation of each fracture  Preoperative Diagnosis:  - cervical spinal instability - closed unstable burst fracture at C6 - epidural hematoma of the cervical spine - 3 column injury C6 - lumbar spinal instability - 3 column unstable spinal injury at L1 - closed unstable burst fracture at l1  Postoperative Diagnosis: same   EBL: 1300 ml IVF: 4500 ml Drains: 4 placed Disposition: Extubated and Stable to PACU Complications: none  A foley catheter was placed.   Preoperative Note:   Risks of surgery discussed include: infection, bleeding, stroke, coma, death, paralysis, CSF leak, nerve/spinal cord injury, numbness, tingling, weakness, complex regional pain syndrome, recurrent stenosis and/or disc herniation, vascular injury, development of instability, neck/back pain, need for further surgery, persistent symptoms, development of deformity, and the risks of anesthesia. The patient understood these risks and agreed to proceed.  Operative Note:   OPERATIVE PROCEDURE 1:  1. Posterior Segmental Instrumentation C2-T2 using Globus Quartex 2. Posterolateral arthrodesis from C2-T2 3. Right Cervical Hemilaminectomy from C5-C6 for evacuation of epidural hematoma 4. Open reduction and internal fixation of C6 fracture  OPERATIVE PROCEDURE:  After induction of general anesthesia, the mayfield was applied and the patient positioned.  The ead was secured.   A midline incision was then planned using fluoroscopy.  A timeout was performed, and antibiotics given.  Next, the posterior cervical region was prepped and draped in the usual sterile fashion. The incision was injected with local anesthetic, the  opened sharply. A subperiosteal dissection was then carried out to expose the remaining posterior elements from C2 and T2.  After satisfactory exposure had been obtained, our attention was turned to placement of lateral mass screws.    At C2, we used anatomic localization to place pars screws on each side.  From C3-6, Magerl technique was used to placed lateral mass screws at each level.  At T1 and T2, anatomic localization was utilized to make start points, then tracts were made for thoracic pedicle screws. These were then placed and checked with flouroscopy.   Rods were measured and shaped, then secured to the screws according to manufacturer's specifications.  By reducing the screws to the rod, the C6 fracture was openly reduced and internally fixated, thus completing the ORIF.   The drill was used to perform R C5-6 hemilaminectomy, whereupon an epidural hematoma was noted.  This was removed until the spinal cord was decompressed.  After decompression was complete, final AP and lateral radiographs were taken.  The wound was copiously irrigated with solution and hemostasis was achieved.  Using high-speed drill, the lateral margin of the lateral masses was gently decorticated.  Allograft was placed for arthrodesis C2-T2.   A Hemovac drain was then placed in the wound deep to the fascia.   The wound was closed in a multilayer fashion using interrupted 0 and 2-0 Vicryl sutures. A superficial drain was placed. The final skin edges were reapproximated using staples. A sterile dressing was placed.  We then removed the drapes, and moved the left arm into the superman position.  The right arm was not mobile enough, so was secured at the side.   Operative Note 2:  1. Open Reduction and Internal  fixation of L1 fracture 2. Posterolateral arthrodesis T10 to L4 3. Posterior segmental instrumentation T10 to L4 using Globus Creo    After completing the cervical reconstruction, the patient was  prepped and draped in the standard fashion in the thoracolumbar spine. A full timeout was performed. Preoperative antibiotics were given. The incision was injected with local anesthetic.  The incision was opened with a scalpel, then the soft tissues divided with the Bovie. Self-retaining retractors were placed. The paraspinus muscles were reflected laterally in subperiosteal fashion until the transverse processes were visible. Flouroscopy was used to confirm our localization.   The self-retaining retractors were repositioned. We then placed pedicle screws.   At T10 on one side, a starting point was chosen based on anatomic landmarks, then breached with a high speed drill. A pedicle finder probe was used to cannulate the pedicle, then the balltip probe used to confirm lack of breach. The tract was tapped, re-checked with the balltip probe, then a 6.5 x 45 mm pedicle screw was placed. The procedure was then repeated contralaterally and the same size screw placed.  At T11 on one side, a starting point was chosen based on anatomic landmarks, then breached with a high speed drill. A pedicle finder probe was used to cannulate the pedicle, then the balltip probe used to confirm lack of breach. The tract was tapped, re-checked with the balltip probe, then a 6.5 x 45 mm pedicle screw was placed. The procedure was then repeated contralaterally and the same size screw placed.  At T12 on one side, a starting point was chosen based on anatomic landmarks, then breached with a high speed drill. A pedicle finder probe was used to cannulate the pedicle, then the balltip probe used to confirm lack of breach. The tract was tapped, re-checked with the balltip probe, then a 6.5 x 45 mm pedicle screw was placed. The procedure was then repeated contralaterally and the same size screw placed.  At L2 on one side, a starting point was chosen based on anatomic landmarks, then breached with a high speed drill. A pedicle finder  probe was used to cannulate the pedicle, then the balltip probe used to confirm lack of breach. The tract was tapped, re-checked with the balltip probe, then a 6.5 x 45 mm pedicle screw was placed. The procedure was then repeated contralaterally and the same size screw placed.  At L3 on one side, a starting point was chosen based on anatomic landmarks, then breached with a high speed drill. A pedicle finder probe was used to cannulate the pedicle, then the balltip probe used to confirm lack of breach. The tract was tapped, re-checked with the balltip probe, then a 6.5 x 45 mm pedicle screw was placed. The procedure was then repeated contralaterally and the same size screw placed.  At L4 on one side, a starting point was chosen based on anatomic landmarks, then breached with a high speed drill. A pedicle finder probe was used to cannulate the pedicle, then the balltip probe used to confirm lack of breach. The tract was tapped, re-checked with the balltip probe, then a 6.5 x 45 mm pedicle screw was placed. The procedure was then repeated contralaterally and the same size screw placed.  We checked the screws using AP radiographs, which showed lateral breach at R T10 and  L4.  These were repositioned and rechecked.    Rods were measured to length, cut, and shaped. The rods were secured using locking caps to manufacturer's specifications. Final AP  and lateral radiographs were taken to confirm placement of instrumentation and appropriate alignment.   By reducing the screws to the rod, the unstable L1 fracture was internally fixated.  The wound was copiously irrigated, then the external surfaces of the remaining lamina, facet, and transverse processes from T10 to L4 were decorticated. A mixture of allograftwas placed over the decorticated surfaces for arthrodesis.  A drain was placed subfascially.   After hemostasis, the wound was closed in layers with 0 and 2-0 vicryl. Staples were applied to the  incision.  The patient was then flipped supine and extubated with incident. All counts were correct times 2 at the end of the case. No immediate complications were noted.     After closure, the patient was flipped supine and the Gardner-Wells tongs removed.  Patient was then handed back over to anesthesia.  All counts were correct at the conclusion of the procedure.    The patient was successfully extubated. Venetia Night MD

## 2021-06-12 NOTE — ED Notes (Signed)
Patient unable to void since arrival. Reports one void yesterday morning, PTA to this ED. Patient with attempted unsuccessfully to void and defecate for this RN. MD Don Perking informed.

## 2021-06-12 NOTE — Consult Note (Addendum)
Referring Physician:  No referring provider defined for this encounter.  Primary Physician:  Juline Patch, MD  Chief Complaint:  unstable L1 fracture  History of Present Illness: 06/08/2021 [name] is a 75 y.o. male who presents with the chief complaint of back pain after a fall.  He had a mechanical fall 2 days ago.  He had onset of back pain prompting an ER visit, where xrays did not show an obvious fracture.  He was treated and released, but had worsening pain prompting return to ER.  He now says he has terrible pain with movement, and cannot bear weight due to pain.  He had an MRI showing a 3 column injury with obvious instability.    He has no new neurological symptoms, but does have history of a stroke with resulting R sided weakness.   He is on coumadin but reversal has started.  Review of Systems:  A 10 point review of systems is negative, except for the pertinent positives and negatives detailed in the HPI.  Past Medical History: Past Medical History:  Diagnosis Date   Allergy    Hypertension    Stroke [facility])     Past Surgical History: Past Surgical History:  Procedure Laterality Date   HERNIA REPAIR      Allergies: Allergies as of 06/11/2021 - Review Complete 06/11/2021  Allergen Reaction Noted   Latex  06/02/2015    Medications:  Current Facility-Administered Medications:    0.9 %  sodium chloride infusion, , Intravenous, Continuous, Ivor Costa, MD   acetaminophen (TYLENOL) tablet 650 mg, 650 mg, Oral, Q6H PRN, Ivor Costa, MD   hydrALAZINE (APRESOLINE) injection 5 mg, 5 mg, Intravenous, Q2H PRN, Ivor Costa, MD   lactated ringers infusion, , Intravenous, Continuous, Ivor Costa, MD   methocarbamol (ROBAXIN) tablet 500 mg, 500 mg, Oral, Q8H PRN, Ivor Costa, MD   morphine 2 MG/ML injection 2 mg, 2 mg, Intravenous, Q4H PRN, Ivor Costa, MD   ondansetron (ZOFRAN) injection 4 mg, 4 mg, Intravenous, Q8H PRN, Ivor Costa, MD   oxyCODONE-acetaminophen  (PERCOCET/ROXICET) 5-325 MG per tablet 1 tablet, 1 tablet, Oral, Q4H PRN, Ivor Costa, MD   prothrombin complex conc human (KCENTRA) IVPB 1,593 Units, 1,593 Units, Intravenous, STAT, Ivor Costa, MD  Current Outpatient Medications:    atenolol (TENORMIN) 25 MG tablet, Take 1 tablet (25 mg total) by mouth daily., Disp: 90 tablet, Rfl: 1   fluticasone (FLONASE) 50 MCG/ACT nasal spray, Place 1 spray into both nostrils daily., Disp: 16 g, Rfl: 5   Lidocaine (HM LIDOCAINE PATCH) 4 % PTCH, Apply 1 application topically every 12 (twelve) hours as needed., Disp: 5 patch, Rfl: 2   lisinopril (ZESTRIL) 40 MG tablet, Take 1 tablet (40 mg total) by mouth daily., Disp: 90 tablet, Rfl: 1   oxyCODONE (ROXICODONE) 5 MG immediate release tablet, Take 1 tablet (5 mg total) by mouth every 8 (eight) hours as needed for up to 3 days., Disp: 9 tablet, Rfl: 0   warfarin (COUMADIN) 1 MG tablet, TAKE 1 TABLET AS DIRECTED (TAKE 4 MG ON MONDAY, WEDNESDAY, AND FRIDAY AND 3 MG THE REST OF THE WEEK), Disp: 90 tablet, Rfl: 1   warfarin (COUMADIN) 3 MG tablet, TAKE 1 TABLET DAILY OR AS DIRECTED, Disp: 90 tablet, Rfl: 1   warfarin (COUMADIN) 4 MG tablet, TAKE 1 TABLET DAILY OR AS DIRECTED, Disp: 90 tablet, Rfl: 1   Social History: Social History   Tobacco Use   Smoking status: Never   Smokeless  tobacco: Never  Substance Use Topics   Alcohol use: No    Alcohol/week: 0.0 standard drinks   Drug use: No    Family Medical History: No family history on file.  Physical Examination: Vitals:   06/11/21 2333 06/21/2021 0302  BP: (!) 156/76 139/72  Pulse: 64 72  Resp: 16 18  Temp: 97.7 F (36.5 C) 97.6 F (36.4 C)  SpO2: 95% 93%     General: Patient is well developed, well nourished, calm, collected, and in mild to moderate distress.  Psychiatric: Patient is non-anxious.  Head:  Pupils equal, round, and reactive to light. He has bilateral black eyes.  He has forehead swelling  ENT:  Oral mucosa appears well  hydrated.  Neck:   Limited range of motion with some pain on midline palpation, but not specific point tenderness.  Respiratory: Patient is breathing without any difficulty.  Extremities: No edema.  Vascular: Palpable pulses in dorsal pedal vessels.  Skin:   On exposed skin, there are no abnormal skin lesions.  NEUROLOGICAL:  General: In no acute distress.   Awake, alert, oriented to person, place, and time.  Pupils equal round and reactive to light.  Facial tone is symmetric.  Tongue protrusion is midline.    He is on bedrest.  Strength: Side Biceps Triceps Deltoid Interossei Grip Wrist Ext. Wrist Flex.  R 4+ 4+ 4+ 4+ 4+ 5 5  L 5 5 5 5 5 5 5    Side Iliopsoas Quads Hamstring PF DF EHL  R 4+ 4+ 5 5 5 5   L 5 5 5 5 5 5     Bilateral upper and lower extremity sensation is intact to light touch. Reflexes are 1+ and symmetric at the biceps, triceps, brachioradialis, patella and achilles. Hoffman's is absent.  Gait is untested.  Imaging: MRI L spine 06/17/2021 IMPRESSION: 1. 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 The fracture propagates posteriorly through the left T12 neural foramen, left T12-L1 facets, and right L1 posterior elements. Slight anterolisthesis. No retropulsion or spinal stenosis. Recommend Spine Precautions and Spine Surgery consultation.   2. No other acute finding in the lumbar spine. 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   Electronically Signed: By: Genevie Ann M.D. On: 06/14/2021 07:58  CT C spine 06/10/21 IMPRESSION: 1. Anterior wedging of the C6 vertebral body with discontinuity along the anterior osteophytes at the C5-6 level, along with mild interspinous splaying at the C5-6 level. There is a strong likelihood that this is old/chronic given the lack of sharply defined cortical discontinuity and the relative cortication of the osteophytes.  Given the lack of prior comparison exams, MRI of the cervical spine without contrast is recommended to confirm the chronicity of these findings unless the patient has a known history of remote C6 compression. 2. Otherwise anterior flowing osteophytes between C3 and T2. 3. Left foraminal impingement at C3-4 due to facet and left eccentric intervertebral spurring. Mild left foraminal stenosis at C2-3. 4. Atherosclerosis.     Electronically Signed   By: Van Clines M.D.   On: 06/10/2021 14:33  I have personally reviewed the images and agree with the above interpretation.  Labs: CBC Latest Ref Rng & Units 06/20/2021 06/10/2021  WBC 4.0 - 10.5 K/uL 12.8(H) 14.7(H)  Hemoglobin 13.0 - 17.0 g/dL 15.1 15.7  Hematocrit 39.0 - 52.0 % 44.6 46.7  Platelets 150 - 400 K/uL 135(L) 178       Assessment  and Plan: Mr. Bisceglia is a pleasant 75 y.o. male with unstable closed 3 column fracture through the L1 vertebral body.  He has acute spinal instability.  He is ASIA E and at his baseline.  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  To confirm that this is chronic, I have ordered a stat MRI C spine.  - TL spine CT for surgical planning - NPO - Reverse coumadin - OR today for likely T10-L4 posterior fusion but defer final planning until CT's and MRI are performed.  I discussed the planned procedure at length with the patient and his son and daughter, including the risks, benefits, alternatives, and indications. The risks discussed include but are not limited to bleeding, infection, need for reoperation, spinal fluid leak, stroke, vision loss, anesthetic complication, coma, paralysis, and even death. I also described in detail that improvement was not guaranteed.  The patient expressed understanding of these risks, and asked that we proceed with surgery. I described the surgery in layman's terms, and gave ample opportunity for questions, which were  answered to the best of my ability.    Stella Encarnacion K. Myer Haff MD, MPHS Dept. of Neurosurgery  This is an addendum to the above.  On his MRI C spine, there is edema in the C6 vertebral body and an epidural hematoma.  This suggests an unstable cervical spine fracture as well.   He has a 3 column unstable closed C5-6 fracture with epidural hematoma, as well as a 3 column unstable fracture at L1.    We will address both of these injuries during surgery today.    Venetia Night MD

## 2021-06-12 NOTE — H&P (View-Only) (Signed)
Referring Physician:  No referring provider defined for this encounter.  Primary Physician:  Juline Patch, MD  Chief Complaint:  unstable L1 fracture  History of Present Illness: 06/26/2021 [name] is a 75 y.o. male who presents with the chief complaint of back pain after a fall.  He had a mechanical fall 2 days ago.  He had onset of back pain prompting an ER visit, where xrays did not show an obvious fracture.  He was treated and released, but had worsening pain prompting return to ER.  He now says he has terrible pain with movement, and cannot bear weight due to pain.  He had an MRI showing a 3 column injury with obvious instability.    He has no new neurological symptoms, but does have history of a stroke with resulting R sided weakness.   He is on coumadin but reversal has started.  Review of Systems:  A 10 point review of systems is negative, except for the pertinent positives and negatives detailed in the HPI.  Past Medical History: Past Medical History:  Diagnosis Date   Allergy    Hypertension    Stroke [facility])     Past Surgical History: Past Surgical History:  Procedure Laterality Date   HERNIA REPAIR      Allergies: Allergies as of 06/11/2021 - Review Complete 06/11/2021  Allergen Reaction Noted   Latex  06/02/2015    Medications:  Current Facility-Administered Medications:    0.9 %  sodium chloride infusion, , Intravenous, Continuous, Ivor Costa, MD   acetaminophen (TYLENOL) tablet 650 mg, 650 mg, Oral, Q6H PRN, Ivor Costa, MD   hydrALAZINE (APRESOLINE) injection 5 mg, 5 mg, Intravenous, Q2H PRN, Ivor Costa, MD   lactated ringers infusion, , Intravenous, Continuous, Ivor Costa, MD   methocarbamol (ROBAXIN) tablet 500 mg, 500 mg, Oral, Q8H PRN, Ivor Costa, MD   morphine 2 MG/ML injection 2 mg, 2 mg, Intravenous, Q4H PRN, Ivor Costa, MD   ondansetron (ZOFRAN) injection 4 mg, 4 mg, Intravenous, Q8H PRN, Ivor Costa, MD   oxyCODONE-acetaminophen  (PERCOCET/ROXICET) 5-325 MG per tablet 1 tablet, 1 tablet, Oral, Q4H PRN, Ivor Costa, MD   prothrombin complex conc human (KCENTRA) IVPB 1,593 Units, 1,593 Units, Intravenous, STAT, Ivor Costa, MD  Current Outpatient Medications:    atenolol (TENORMIN) 25 MG tablet, Take 1 tablet (25 mg total) by mouth daily., Disp: 90 tablet, Rfl: 1   fluticasone (FLONASE) 50 MCG/ACT nasal spray, Place 1 spray into both nostrils daily., Disp: 16 g, Rfl: 5   Lidocaine (HM LIDOCAINE PATCH) 4 % PTCH, Apply 1 application topically every 12 (twelve) hours as needed., Disp: 5 patch, Rfl: 2   lisinopril (ZESTRIL) 40 MG tablet, Take 1 tablet (40 mg total) by mouth daily., Disp: 90 tablet, Rfl: 1   oxyCODONE (ROXICODONE) 5 MG immediate release tablet, Take 1 tablet (5 mg total) by mouth every 8 (eight) hours as needed for up to 3 days., Disp: 9 tablet, Rfl: 0   warfarin (COUMADIN) 1 MG tablet, TAKE 1 TABLET AS DIRECTED (TAKE 4 MG ON MONDAY, WEDNESDAY, AND FRIDAY AND 3 MG THE REST OF THE WEEK), Disp: 90 tablet, Rfl: 1   warfarin (COUMADIN) 3 MG tablet, TAKE 1 TABLET DAILY OR AS DIRECTED, Disp: 90 tablet, Rfl: 1   warfarin (COUMADIN) 4 MG tablet, TAKE 1 TABLET DAILY OR AS DIRECTED, Disp: 90 tablet, Rfl: 1   Social History: Social History   Tobacco Use   Smoking status: Never   Smokeless  tobacco: Never  Substance Use Topics   Alcohol use: No    Alcohol/week: 0.0 standard drinks   Drug use: No    Family Medical History: No family history on file.  Physical Examination: Vitals:   06/11/21 2333 06/11/2021 0302  BP: (!) 156/76 139/72  Pulse: 64 72  Resp: 16 18  Temp: 97.7 F (36.5 C) 97.6 F (36.4 C)  SpO2: 95% 93%     General: Patient is well developed, well nourished, calm, collected, and in mild to moderate distress.  Psychiatric: Patient is non-anxious.  Head:  Pupils equal, round, and reactive to light. He has bilateral black eyes.  He has forehead swelling  ENT:  Oral mucosa appears well  hydrated.  Neck:   Limited range of motion with some pain on midline palpation, but not specific point tenderness.  Respiratory: Patient is breathing without any difficulty.  Extremities: No edema.  Vascular: Palpable pulses in dorsal pedal vessels.  Skin:   On exposed skin, there are no abnormal skin lesions.  NEUROLOGICAL:  General: In no acute distress.   Awake, alert, oriented to person, place, and time.  Pupils equal round and reactive to light.  Facial tone is symmetric.  Tongue protrusion is midline.    He is on bedrest.  Strength: Side Biceps Triceps Deltoid Interossei Grip Wrist Ext. Wrist Flex.  R 4+ 4+ 4+ 4+ 4+ 5 5  L 5 5 5 5 5 5 5    Side Iliopsoas Quads Hamstring PF DF EHL  R 4+ 4+ 5 5 5 5   L 5 5 5 5 5 5     Bilateral upper and lower extremity sensation is intact to light touch. Reflexes are 1+ and symmetric at the biceps, triceps, brachioradialis, patella and achilles. Hoffman's is absent.  Gait is untested.  Imaging: MRI L spine 06/16/2021 IMPRESSION: 1. 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 The fracture propagates posteriorly through the left T12 neural foramen, left T12-L1 facets, and right L1 posterior elements. Slight anterolisthesis. No retropulsion or spinal stenosis. Recommend Spine Precautions and Spine Surgery consultation.   2. No other acute finding in the lumbar spine. 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   Electronically Signed: By: Genevie Ann M.D. On: 06/10/2021 07:58  CT C spine 06/10/21 IMPRESSION: 1. Anterior wedging of the C6 vertebral body with discontinuity along the anterior osteophytes at the C5-6 level, along with mild interspinous splaying at the C5-6 level. There is a strong likelihood that this is old/chronic given the lack of sharply defined cortical discontinuity and the relative cortication of the osteophytes.  Given the lack of prior comparison exams, MRI of the cervical spine without contrast is recommended to confirm the chronicity of these findings unless the patient has a known history of remote C6 compression. 2. Otherwise anterior flowing osteophytes between C3 and T2. 3. Left foraminal impingement at C3-4 due to facet and left eccentric intervertebral spurring. Mild left foraminal stenosis at C2-3. 4. Atherosclerosis.     Electronically Signed   By: Van Clines M.D.   On: 06/10/2021 14:33  I have personally reviewed the images and agree with the above interpretation.  Labs: CBC Latest Ref Rng & Units 06/23/2021 06/10/2021  WBC 4.0 - 10.5 K/uL 12.8(H) 14.7(H)  Hemoglobin 13.0 - 17.0 g/dL 15.1 15.7  Hematocrit 39.0 - 52.0 % 44.6 46.7  Platelets 150 - 400 K/uL 135(L) 178       Assessment  and Plan: [name] is a pleasant 75 y.o. male with unstable closed 3 column fracture through the L1 vertebral body.  He has acute spinal instability.  He is ASIA E and at his baseline.  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  To confirm that this is chronic, I have ordered a stat MRI C spine.  - TL spine CT for surgical planning - NPO - Reverse coumadin - OR today for likely T10-L4 posterior fusion but defer final planning until CT's and MRI are performed.  I discussed the planned procedure at length with the patient and his son and daughter, including the risks, benefits, alternatives, and indications. The risks discussed include but are not limited to bleeding, infection, need for reoperation, spinal fluid leak, stroke, vision loss, anesthetic complication, coma, paralysis, and even death. I also described in detail that improvement was not guaranteed.  The patient expressed understanding of these risks, and asked that we proceed with surgery. I described the surgery in layman's terms, and gave ample opportunity for questions, which were  answered to the best of my ability.    Leora Platt K. Myer Haff MD, MPHS Dept. of Neurosurgery  This is an addendum to the above.  On his MRI C spine, there is edema in the C6 vertebral body and an epidural hematoma.  This suggests an unstable cervical spine fracture as well.   He has a 3 column unstable closed C5-6 fracture with epidural hematoma, as well as a 3 column unstable fracture at L1.    We will address both of these injuries during surgery today.    Venetia Night MD

## 2021-06-12 NOTE — Anesthesia Procedure Notes (Signed)
Procedure Name: Intubation Date/Time: 06/07/2021 1:58 PM Performed by: Jannet Mantis, CRNA Pre-anesthesia Checklist: Patient identified, Emergency Drugs available, Suction available and Patient being monitored Patient Re-evaluated:Patient Re-evaluated prior to induction Oxygen Delivery Method: Circle system utilized Preoxygenation: Pre-oxygenation with 100% oxygen Induction Type: IV induction Ventilation: Mask ventilation without difficulty Laryngoscope Size: McGraph and 4 Grade View: Grade I Tube type: Oral Tube size: 7.5 mm Number of attempts: 1 Airway Equipment and Method: Stylet Placement Confirmation: ETT inserted through vocal cords under direct vision Secured at: 22 cm Tube secured with: Tape Dental Injury: Teeth and Oropharynx as per pre-operative assessment

## 2021-06-12 NOTE — H&P (Signed)
History and Physical    [name] [id number] DOB: [birth year] DOA: 06/11/2021  Referring MD/NP/PA:   PCP: Juline Patch, MD   Patient coming from:  The patient is coming from home.   Chief Complaint: back pain and neck pain after fall.  HPI: [name] is a 75 y.o. male with medical history significant of hypertension, hyperlipidemia, stroke, atrial fibrillation on Coumadin, CKD stage II, who presents with back pain and neck pain after fall.  Per his daughter and son at the bedside, patient fell 2 days ago on 11/4.  At that time patient developed the back pain. He was seen in ED. x-ray of the lumbar spine was negative for acute fracture. CXR showed mild wedging at the thoracolumbar junction with age indeterminate.  CT of head was negative for acute intracranial abnormalities.  CT of C-spine showed anterior wedging of the C6 vertebral body with discontinuity along the anterior osteophytes at the C5-6 level, along with mild interspinous splaying at the C5-6 level, which was considered as likely old/chronic change.   Patient states that he continues to have back pain after went home, which is constant, sharp, moderate to severe, nonradiating.  No leg weakness or numbness.  Patient also has mild neck pain. Patient does not have chest pain, cough, shortness of breath.  No fever or chills.  No nausea vomiting, diarrhea or abdominal pain.  No symptoms of UTI.  Patient was found to have urinary retention in ED, Foley catheter is placed to the ED.  ED Course: pt was found to have WBC 12.8, INR 2.2, troponin level 7, pending COVID-19 PCR, slightly worsening renal function, temperature normal, blood pressure 139/72, heart rate 72, RR 18, oxygen saturation 93-95% on room air.  MRI of L-spine and C-spine showed unstable L1 vertebral fracture and C5-C6 unstable C-spine fracture.  Patient is admitted to [facility] bed as inpatient.  Dr. Izora Ribas of neurosurgery is consulted.     CT of T-spin 1.  Questionable fracture through flowing anterior endplate osteophytes at C5-C6 in the lower cervical spine. Noncontrast Cervical Spine CT (rather than MRI) would be most valuable to further characterize.   2. 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 No superimposed thoracic spine fracture.   3. Small bilateral layering pleural effusions with lung base atelectasis.   4. Aortic Atherosclerosis (ICD10-I70.0).   MRI-L spin: 1. 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 The fracture propagates posteriorly through the left T12 neural foramen, left T12-L1 facets, and right L1 posterior elements. Slight anterolisthesis. No retropulsion or spinal stenosis. Recommend Spine Precautions and Spine Surgery consultation.   2. No other acute finding in the lumbar spine. 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  MRI-C spin: 1. Acute C5-C6 fracture through the ankylosed spine, corresponding to the abnormality on earlier Thoracic and Cervical CTs. 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 No definite cord contusion or edema.   2. Extensive midline suboccipital and bilateral paraspinal muscle edema/injury.   Preliminary report of this exam was discussed by telephone with Neurosurgery Dr. Meade Maw on 07/01/2021 at 1035 hours.  Review of Systems:   General: no fevers, chills, no body weight gain, has fatigue HEENT: no blurry vision, hearing changes or sore throat Respiratory: no dyspnea, coughing, wheezing CV: no chest pain, no palpitations GI: no nausea, vomiting, abdominal pain, diarrhea, constipation  GU: no dysuria, burning on urination, increased urinary frequency, hematuria  Ext: no leg edema Neuro: no unilateral weakness, numbness, or  tingling, no vision change or hearing loss. Has fall. Skin: no rash, no skin tear. MSK: has back pain and neck pain Heme: No easy bruising.  Travel history: No recent long distant travel.  Allergy:  Allergies  Allergen Reactions   Latex     Past Medical History:  Diagnosis Date   Allergy    Hypertension    Stroke [facility])     Past Surgical History:  Procedure Laterality Date   HERNIA REPAIR      Social History:  reports that he has never smoked. He has never used smokeless tobacco. He reports that he does not drink alcohol and does not use drugs.  Family History:  Family History  Problem Relation Age of Onset   Stroke Father      Prior to Admission medications   Medication Sig Start Date End Date Taking? Authorizing Provider  atenolol (TENORMIN) 25 MG tablet Take 1 tablet (25 mg total) by mouth daily. 06/01/21   Juline Patch, MD  fluticasone (FLONASE) 50 MCG/ACT nasal spray Place 1 spray into both nostrils daily. 06/01/21   Juline Patch, MD  Lidocaine (HM LIDOCAINE PATCH) 4 % PTCH Apply 1 application topically every 12 (twelve) hours as needed. 06/10/21   Nena Polio, MD  lisinopril (ZESTRIL) 40 MG tablet Take 1 tablet (40 mg total) by mouth daily. 06/01/21   Juline Patch, MD  oxyCODONE (ROXICODONE) 5 MG immediate release tablet Take 1 tablet (5 mg total) by mouth every 8 (eight) hours as needed for up to 3 days. 06/11/21 06/14/21  Rada Hay, MD  warfarin (COUMADIN) 1 MG tablet TAKE 1 TABLET AS DIRECTED (TAKE 4 MG ON MONDAY, WEDNESDAY, AND FRIDAY AND 3 MG THE REST OF THE WEEK) 07/12/20   Juline Patch, MD  warfarin (COUMADIN) 3 MG tablet TAKE 1 TABLET DAILY OR AS DIRECTED 06/09/20   Otilio Miu C, MD  warfarin (COUMADIN) 4 MG tablet TAKE 1 TABLET DAILY OR AS DIRECTED 11/30/20   Juline Patch, MD    Physical Exam: Vitals:   06/10/2021 0302 07/02/2021 1115 07/02/2021 1130 06/24/2021 1230  BP: 139/72 (!) 172/86 (!) 174/83 (!) 164/85  Pulse: 72 91 86 84  Resp:  18 (!) 23 (!) 22 (!) 23  Temp: 97.6 F (36.4 C)     TempSrc: Oral     SpO2: 93% 96% 94% 95%  Weight:      Height:       General: Not in acute distress HEENT:       Eyes: PERRL, EOMI, no scleral icterus.       ENT: No discharge from the ears and nose, no pharynx injection, no tonsillar enlargement.        Neck: No JVD, no bruit, no mass felt. Heme: No neck lymph node enlargement. Cardiac: S1/S2, RRR, No murmurs, No gallops or rubs. Respiratory: No rales, wheezing, rhonchi or rubs. GI: Soft, nondistended, nontender, no rebound pain, no organomegaly, BS present. GU: No hematuria Ext: No pitting leg edema bilaterally. 1+DP/PT pulse bilaterally. Musculoskeletal: has tenderness in lower back and neck Skin: No rashes. Has bruise in right eye Neuro: Alert, oriented X3, cranial nerves II-XII grossly intact, moves all extremities  Psych: Patient is not psychotic, no suicidal or hemocidal ideation.  Labs on Admission: I have personally reviewed following labs and imaging studies  CBC: Recent Labs  Lab 06/10/21 1258 06/18/2021 0755  WBC 14.7* 12.8*  NEUTROABS 12.0* 8.9*  HGB 15.7 15.1  HCT 46.7 44.6  MCV 94.7 94.3  PLT 178 [id number]*   Basic Metabolic Panel: Recent Labs  Lab 06/10/21 1258 07/01/2021 0755  NA 138 136  K 4.0 4.0  CL 103 102  CO2 28 26  GLUCOSE 232* 153*  BUN 22 25*  CREATININE 1.16 1.26*  CALCIUM 8.7* 8.8*   GFR: Estimated Creatinine Clearance: 61.1 mL/min (A) (by C-G formula based on SCr of 1.26 mg/dL (H)). Liver Function Tests: Recent Labs  Lab 06/10/21 1258  AST 59*  ALT 44  ALKPHOS 113  BILITOT 1.2  PROT 7.5  ALBUMIN 3.5   No results for input(s): LIPASE, AMYLASE in the last 168 hours. No results for input(s): AMMONIA in the last 168 hours. Coagulation Profile: Recent Labs  Lab 06/15/2021 0755 06/30/2021 1138  INR 2.3* 1.3*   Cardiac Enzymes: No results for input(s): CKTOTAL, CKMB, CKMBINDEX, TROPONINI in the last 168 hours. BNP (last 3  results) No results for input(s): PROBNP in the last 8760 hours. HbA1C: No results for input(s): HGBA1C in the last 72 hours. CBG: No results for input(s): GLUCAP in the last 168 hours. Lipid Profile: No results for input(s): CHOL, HDL, LDLCALC, TRIG, CHOLHDL, LDLDIRECT in the last 72 hours. Thyroid Function Tests: No results for input(s): TSH, T4TOTAL, FREET4, T3FREE, THYROIDAB in the last 72 hours. Anemia Panel: No results for input(s): VITAMINB12, FOLATE, FERRITIN, TIBC, IRON, RETICCTPCT in the last 72 hours. Urine analysis:    Component Value Date/Time   COLORURINE YELLOW (A) 06/28/2021 0755   APPEARANCEUR CLEAR (A) 06/14/2021 0755   LABSPEC 1.024 06/18/2021 0755   PHURINE 5.0 06/08/2021 0755   GLUCOSEU 50 (A) 07/01/2021 0755   HGBUR MODERATE (A) 06/15/2021 0755   BILIRUBINUR NEGATIVE 07/06/2021 0755   KETONESUR 5 (A) 06/16/2021 0755   PROTEINUR 30 (A) 06/16/2021 0755   NITRITE NEGATIVE 07/03/2021 0755   LEUKOCYTESUR NEGATIVE 06/11/2021 0755   Sepsis Labs: @LABRCNTIP (procalcitonin:4,lacticidven:4) ) Recent Results (from the past 240 hour(s))  Resp Panel by RT-PCR (Flu A&B, Covid) Nasopharyngeal Swab     Status: None   Collection Time: 06/29/2021 11:38 AM   Specimen: Nasopharyngeal Swab; Nasopharyngeal(NP) swabs in vial transport medium  Result Value Ref Range Status   SARS Coronavirus 2 by RT PCR NEGATIVE NEGATIVE Final    Comment: (NOTE) SARS-CoV-2 target nucleic acids are NOT DETECTED.  The SARS-CoV-2 RNA is generally detectable in upper respiratory specimens during the acute phase of infection. The lowest concentration of SARS-CoV-2 viral copies this assay can detect is 138 copies/mL. A negative result does not preclude SARS-Cov-2 infection and should not be used as the sole basis for treatment or other patient management decisions. A negative result may occur with  improper specimen collection/handling, submission of specimen other than nasopharyngeal swab,  presence of viral mutation(s) within the areas targeted by this assay, and inadequate number of viral copies(<138 copies/mL). A negative result must be combined with clinical observations, patient history, and epidemiological information. The expected result is Negative.  Fact Sheet for Patients:  EntrepreneurPulse.com.au  Fact Sheet for Healthcare Providers:  IncredibleEmployment.be  This test is no t yet approved or cleared by the [place] FDA and  has been authorized for detection and/or diagnosis of SARS-CoV-2 by FDA under an Emergency Use Authorization (EUA). This EUA will remain  in effect (meaning this test can be used) for the duration of the COVID-19 declaration under Section 564(b)(1)  of the Act, 21 U.S.C.section 360bbb-3(b)(1), unless the authorization is terminated  or revoked sooner.       Influenza A by PCR NEGATIVE NEGATIVE Final   Influenza B by PCR NEGATIVE NEGATIVE Final    Comment: (NOTE) The Xpert Xpress SARS-CoV-2/FLU/RSV plus assay is intended as an aid in the diagnosis of influenza from Nasopharyngeal swab specimens and should not be used as a sole basis for treatment. Nasal washings and aspirates are unacceptable for Xpert Xpress SARS-CoV-2/FLU/RSV testing.  Fact Sheet for Patients: EntrepreneurPulse.com.au  Fact Sheet for Healthcare Providers: IncredibleEmployment.be  This test is not yet approved or cleared by the [place] FDA and has been authorized for detection and/or diagnosis of SARS-CoV-2 by FDA under an Emergency Use Authorization (EUA). This EUA will remain in effect (meaning this test can be used) for the duration of the COVID-19 declaration under Section 564(b)(1) of the Act, 21 U.S.C. section 360bbb-3(b)(1), unless the authorization is terminated or revoked.  Performed at [facility], [address], [place], [place] [address]       Radiological Exams on Admission: CT THORACIC SPINE WO CONTRAST  Addendum Date: 06/22/2021   ADDENDUM REPORT: 06/08/2021 10:52 ADDENDUM: Study discussed by telephone with neurosurgery Dr. Meade Maw on 06/21/2021 at 1035 hours. He alerted me to the fact that a CT Cervical Spine was already done two days ago on 06/10/2021, and was suspicious for C5-C6 level fracture at that time. Follow-up Cervical Spine MRI is pending, but the early images from that exam confirm acute injury (please see that report) - which Dr. Izora Ribas and I discussed. Electronically Signed   By: Genevie Ann M.D.   On: 06/23/2021 10:52   Result Date: 06/17/2021 CLINICAL DATA:  75 year old male with low back pain after a fall, and fracture the ankylosed spine at the L1 level on MRI this morning. EXAM: CT THORACIC SPINE WITHOUT CONTRAST TECHNIQUE: Multidetector CT images of the thoracic were obtained using the standard protocol without intravenous contrast. COMPARISON:  Lumbar MRI today reported separately. FINDINGS: Limited cervical spine imaging: Partially visible interruption of flowing anterior endplate osteophytes in the anterior cervical spine at C5-C6. However, no obvious disruption of the partially visible posterior elements at that level. Dedicated cervical spine CT would be valuable. Thoracic spine segmentation: Normal, concordant with the lumbar numbering on the earlier MRI. Alignment: Mildly exaggerated thoracic kyphosis. No spondylolisthesis. Vertebrae: Ankylosis from the lower cervical spine through T12 from flowing endplate syndesmophytes. Intermittent bilateral costovertebral junction ankylosis also. Osteopenia. No thoracic spine fracture identified above T12. Visible posterior ribs appear intact. T12-L1 level fracture as described on the lumbar spine MRI earlier today. Associated disruption of the right facets at that level, and distracted fracture fragments with subtle anterolisthesis. Paraspinal and other soft tissues:  Calcified aortic atherosclerosis. Mild respiratory motion. Small bilateral layering pleural effusions. Associated lung base atelectasis. Visible major airways are patent. No pericardial effusion is evident. Negative visible noncontrast upper abdominal viscera Upper lumbar paraspinal muscle atrophy. Disc levels: Mild for age thoracic spine degeneration. No CT evidence of thoracic spinal stenosis. IMPRESSION: 1. Questionable fracture through flowing anterior endplate osteophytes at C5-C6 in the lower cervical spine. Noncontrast Cervical Spine CT (rather than MRI) would be most valuable to further characterize. 2. 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 No superimposed thoracic spine fracture. 3. Small bilateral layering pleural effusions with lung base atelectasis. 4. Aortic Atherosclerosis (ICD10-I70.0). Electronically Signed: By: Genevie Ann M.D. On: 06/12/2021 10:12   CT LUMBAR SPINE  WO CONTRAST  Result Date: 06/15/2021 CLINICAL DATA:  75 year old male with low back pain after a fall, and fracture the ankylosed spine at the L1 level on MRI this morning. EXAM: CT LUMBAR SPINE WITHOUT CONTRAST TECHNIQUE: Multidetector CT imaging of the lumbar spine was performed without intravenous contrast administration. Multiplanar CT image reconstructions were also generated. COMPARISON:  Thoracic spine CT today.  Lumbar MRI 0820 hours today. FINDINGS: Segmentation: Normal, concordant with the other thoracic and lumbar numbering today. Alignment: Relatively preserved lumbar lordosis. Vertebrae: Osteopenia. Incomplete ankylosis of the bilateral SI joints, but flowing endplate osteophytes throughout the lumbar spine resulting in ankylosis, bulky at L4-L5. Partially visible distracted horizontal fracture through the superior L1 level, unchanged from the earlier MRI. Elsewhere the lumbar vertebrae are intact.  Intact visible sacrum. Paraspinal and other soft tissues: Atrophied posterior lumbar  paraspinal muscles. Left nephrolithiasis. Aortoiliac calcified atherosclerosis. Negative visible other noncontrast abdominal viscera. Normal appendix is visible. Disc levels: Unchanged from the MRI earlier today. IMPRESSION: 1. Distracted, horizontal fracture through the superior L1 level as seen on the earlier MRI (and more included on the Thoracic Spine CT reported separately). 2. Underlying spinal ankylosis and osteopenia. No other acute osseous abnormality in the lumbar spine. Incomplete SI joint ankylosis. 3. Left nephrolithiasis.  Aortic Atherosclerosis (ICD10-I70.0). Electronically Signed   By: Genevie Ann M.D.   On: 06/25/2021 10:19   MR CERVICAL SPINE WO CONTRAST  Result Date: 06/29/2021 CLINICAL DATA:  Study discussed by telephone with neurosurgery Dr. Meade Maw on 06/30/2021 at 1035 hours. EXAM: MRI CERVICAL SPINE WITHOUT CONTRAST TECHNIQUE: Multiplanar, multisequence MR imaging of the cervical spine was performed. No intravenous contrast was administered. COMPARISON:  Thoracic spine CT today, and cervical spine CT 06/10/2021. FINDINGS: Alignment: Less reversal of cervical lordosis now. Vertebrae: Abnormal marrow edema in the C6 superior endplate, associated with heterogeneity in the adjacent C5-C6 disc space. In conjunction with the abnormal CT appearance here 2 days ago, and also on the thoracic spine CT today, this is consistent with acute fracture through the ankylosed spine. Underlying spinal ankylosis better demonstrated by CT. No other marrow edema or acute osseous abnormality. Cord: Spinal stenosis and at least mild spinal cord compression from a long segment heterogeneous dorsal epidural hemorrhage from the C4-C5 through C7-T1 levels. See series 7, image 8 and series 6, image 8 and series 9, image 30. No definite associated cord signal abnormality. Posterior Fossa, vertebral arteries, paraspinal tissues: Severe midline suboccipital and bilateral paraspinal muscle edema. Associated small  focal posterior paraspinal hematoma or seroma at the T2 level on series 6, image 3. Cervicomedullary junction is within normal limits. Negative visible posterior fossa. Preserved major vascular flow voids in the neck. 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 IMPRESSION: 1. Acute C5-C6 fracture through the ankylosed spine, corresponding to the abnormality on earlier Thoracic and Cervical CTs. 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 No definite cord contusion or edema. 2. Extensive midline suboccipital and bilateral paraspinal muscle edema/injury. Preliminary report of this exam was discussed by telephone with Neurosurgery Dr. Meade Maw on 06/07/2021 at 1035 hours. Electronically Signed   By: Genevie Ann M.D.   On: 06/11/2021 11:05   MR LUMBAR SPINE WO CONTRAST  Addendum Date: 06/27/2021   ADDENDUM REPORT: 06/12/2021 08:11 ADDENDUM: Study discussed by telephone with Dr.  McHugh on 06/08/2021 at 0905 hours. Electronically Signed   By: Odessa Fleming M.D.   On: 06/19/2021 08:11   Result Date: 06/18/2021 CLINICAL DATA:  75 year old male with low back pain after a fall. EXAM: MRI LUMBAR SPINE WITHOUT CONTRAST TECHNIQUE: Multiplanar, multisequence MR imaging of the lumbar spine was performed. No intravenous contrast was administered. COMPARISON:  Lumbar radiographs 06/10/2021. FINDINGS: Segmentation:  Normal on the comparison radiographs. Alignment: Straightening of lumbar lordosis. There is subtle anterolisthesis associated with a 3 column injury of the superior L1 vertebra, see below. Vertebrae: 3 column fracture with edema or hemorrhage tracking through the bony cleft of the superior L1 body, just subjacent to the L1 superior endplate. Fracture propagates through the bilateral posterior elements as seen on series 7,  image 6, including the left T12-L1 facet joint. The left T12 neural foramen is directly involved. The L1 spinous process appears to remain intact. The left L1 pedicle appears to remain intact. Underlying spinal ankylosis. Mild longitudinal distraction of the fracture fragments. Slight anterolisthesis of T12 with respect to L1. No retropulsion or associated spinal stenosis. Lumbar interbody ankylosis suspected elsewhere related to flowing endplate osteophytes or syndesmophytes, which are bulky anteriorly at L4-L5. However, unclear whether there is bilateral SI joint ankylosis. No other marrow edema or acute osseous abnormality. Conus medullaris and cauda equina: Conus extends to the L1 level. No lower spinal cord or conus signal abnormality. Paraspinal and other soft tissues: Only mild paraspinal edema at the L1 fracture level. Bilateral paraspinal muscle atrophy. Negative visible abdominal viscera. 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 No lumbar spinal stenosis. IMPRESSION: 1. 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 The fracture propagates posteriorly through the left T12 neural foramen, left T12-L1 facets, and right L1 posterior elements. Slight anterolisthesis. No retropulsion or spinal stenosis. Recommend Spine Precautions and Spine Surgery consultation. 2. No other acute finding in the lumbar spine. 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 Electronically Signed: By: Odessa Fleming M.D. On: 06/24/2021 07:58     EKG: I have personally reviewed.  Sinus rhythm, low voltage, LAD.  Assessment/Plan Principal Problem:   Closed L1 vertebral fracture (HCC) Active Problems:   Essential hypertension   HLD (hyperlipidemia)   Urine retention   Stroke (HCC)   CKD (chronic kidney disease), stage II   Leukocytosis   Cervical spine fracture C5-C6   Atrial fibrillation,  chronic (HCC)   Closed L1 vertebral fracture (HCC) and cervical spine fracture C5-C6: pt has unstable fracture of L1 and C5-C6 vertebra. Dr. Myer Haff of neurosurgery will do surgery today.  -Admitted to MedSurg bed as inpatient -Pain control: As needed Percocet, morphine, Tylenol -As needed Robaxin -Patient was given 1 dose of Kcentra in ED to reverse INR  Essential hypertension -IV hydralazine as needed -Continue home atenolol and lisinopril  HLD (hyperlipidemia): Patient's not taking medications currently -Follow-up with PCP  Urine retention -Foley catheter placed at  Chronic atrial fibrillation:: INR 2.2 -Continue atenolol -Hold Coumadin -Patient was given 1 dose of Kcentra for surgery  Stroke [facility]):  -Hold Coumadin  CKD (chronic kidney disease), stage II: Baseline creatinine 1.0-1.1.  His creatinine is 1.26, BUN 25, slightly worsening -IV fluid at 75 cc/h while pt is on NPO -Follow-up with BMP  Leukocytosis: WBC 12.8, no source of infection identified.  Likely reactive -Follow-up with CBC     DVT ppx: SCD Code Status: Full  code Family Communication:  Yes, patient's son and daughter   at bed side Disposition Plan:  Anticipate discharge back to previous environment Consults called: Dr. Izora Ribas of neurosurgery Admission status and Level of care: Med-Surg:    as inpt       Status is: Inpatient  Remains inpatient appropriate because: Patient has multiple comorbidities, fell 2 days ago, continue to have back pain and neck pain.  Patient was found to have unstable L1 vertebral and C5-C6 cervical vertebral fracture.  Patient will need surgery.  His presentation is highly complicated.  Patient will need to be treated in hospital for at least 2 days.           Date of Service 06/24/2021    Ivor Costa [facility]   If 7PM-7AM, please contact night-coverage [email or web address] 06/17/2021, 2:07 PM

## 2021-06-12 NOTE — Transfer of Care (Signed)
Immediate Anesthesia Transfer of Care Note  Patient: Grove Defina  Procedure(s) Performed: LAMINECTOMY WITH POSTERIOR LATERAL ARTHRODESIS LEVEL 1 (Bilateral) POSTERIOR THORACIC FUSION 4 LEVELS POSTERIOR LUMBAR FUSION 4 LEVEL (Bilateral)  Patient Location: PACU  Anesthesia Type:General  Level of Consciousness: drowsy and patient cooperative  Airway & Oxygen Therapy: Patient Spontanous Breathing and Patient connected to face mask oxygen  Post-op Assessment: Report given to RN and Post -op Vital signs reviewed and stable  Post vital signs: Reviewed and stable  Last Vitals:  Vitals Value Taken Time  BP 135/68 06/21/2021 2151  Temp    Pulse    Resp 12 07/02/2021 2151  SpO2 100 % 07/02/2021 2151    Last Pain:  Vitals:   07/01/2021 1106  TempSrc:   PainSc: 7          Complications: No notable events documented.

## 2021-06-12 NOTE — ED Notes (Signed)
Patient transported to MRI 

## 2021-06-12 NOTE — Progress Notes (Signed)
Contacted pt's son via phone to update on pt's progress in the OR going fine and according to plan.

## 2021-06-12 NOTE — ED Provider Notes (Addendum)
[facility] Emergency Department Provider Note  ____________________________________________   Event Date/Time   First MD Initiated Contact with Patient 06/27/2021 [phone]     (approximate)  I have reviewed the triage vital signs and the nursing notes.   HISTORY  Chief Complaint Back Pain   HPI [name] is a 75 y.o. male with past medical history of HTN, CVA with residual right hemibody weakness, A. fib on Coumadin recent ED evaluation 11/4 for abdominal mechanical fall with patient at that time complaining mainly of pain in his mid back and neck.  At that time he had CTs of his head and C-spine as well as plain films of his chest L-spine that did not show any significant acute injuries.  He is returning today accompanied by his daughter for assessment of worsening pain in his lower back as well as worsening weakness in his legs.  Patient states that prior to 2 or 3 days ago he could ambulate without significant difficulty using a cane but has been unable to walk or sit up over the last 2 days.  He states he has not had a bowel movement in 2 or 3 days.  He denies any interim injuries or falls, headache, vision changes, vertigo, chest pain, cough, fevers, shortness of breath, abdominal pain, vomiting or other clear associated sick symptoms other than some bruising that seems to developed around his eyes over the last 1 or 2 days.         Past Medical History:  Diagnosis Date   Allergy    Hypertension    Stroke [facility])     Patient Active Problem List   Diagnosis Date Noted   Closed L1 vertebral fracture (HCC) 06/16/2021   Longstanding persistent atrial fibrillation (HCC) 06/09/2020   Moderate mixed hyperlipidemia not requiring statin therapy 06/09/2020   Essential hypertension 11/29/2017   Chronic seasonal allergic rhinitis due to pollen 11/29/2017   Cerebral vascular disease 11/29/2017   Anticoagulated on Coumadin 02/23/2016    Past Surgical History:   Procedure Laterality Date   HERNIA REPAIR      Prior to Admission medications   Medication Sig Start Date End Date Taking? Authorizing Provider  atenolol (TENORMIN) 25 MG tablet Take 1 tablet (25 mg total) by mouth daily. 06/01/21   Duanne Limerick, MD  fluticasone (FLONASE) 50 MCG/ACT nasal spray Place 1 spray into both nostrils daily. 06/01/21   Duanne Limerick, MD  Lidocaine (HM LIDOCAINE PATCH) 4 % PTCH Apply 1 application topically every 12 (twelve) hours as needed. 06/10/21   Arnaldo Natal, MD  lisinopril (ZESTRIL) 40 MG tablet Take 1 tablet (40 mg total) by mouth daily. 06/01/21   Duanne Limerick, MD  oxyCODONE (ROXICODONE) 5 MG immediate release tablet Take 1 tablet (5 mg total) by mouth every 8 (eight) hours as needed for up to 3 days. 06/11/21 06/14/21  Georga Hacking, MD  warfarin (COUMADIN) 1 MG tablet TAKE 1 TABLET AS DIRECTED (TAKE 4 MG ON MONDAY, WEDNESDAY, AND FRIDAY AND 3 MG THE REST OF THE WEEK) 07/12/20   Duanne Limerick, MD  warfarin (COUMADIN) 3 MG tablet TAKE 1 TABLET DAILY OR AS DIRECTED 06/09/20   Duanne Limerick, MD  warfarin (COUMADIN) 4 MG tablet TAKE 1 TABLET DAILY OR AS DIRECTED 11/30/20   Duanne Limerick, MD    Allergies Latex  No family history on file.  Social History Social History   Tobacco Use   Smoking status: Never  Smokeless tobacco: Never  Substance Use Topics   Alcohol use: No    Alcohol/week: 0.0 standard drinks   Drug use: No    Review of Systems  Review of Systems  Constitutional:  Negative for chills and fever.  HENT:  Negative for sore throat.   Eyes:  Negative for pain.  Respiratory:  Negative for cough and stridor.   Cardiovascular:  Negative for chest pain.  Gastrointestinal:  Positive for constipation. Negative for vomiting.  Musculoskeletal:  Positive for back pain and neck pain.  Skin:  Negative for rash.  Neurological:  Positive for focal weakness (chronic in RUE and RLE but some acute on chronic in RLE) and weakness.  Negative for seizures, loss of consciousness and headaches.  Endo/Heme/Allergies:  Bruises/bleeds easily.  Psychiatric/Behavioral:  Negative for suicidal ideas.   All other systems reviewed and are negative.    ____________________________________________   PHYSICAL EXAM:  VITAL SIGNS: ED Triage Vitals  Enc Vitals Group     BP 06/11/21 2333 (!) 156/76     Pulse Rate 06/11/21 2333 64     Resp 06/11/21 2333 16     Temp 06/11/21 2333 97.7 F (36.5 C)     Temp Source 06/11/21 2333 Oral     SpO2 06/11/21 2333 95 %     Weight 06/11/21 2336 214 lb (97.1 kg)     Height 06/11/21 2336 5\' 11"  (1.803 m)     Head Circumference --      Peak Flow --      Pain Score 06/11/21 2336 10     Pain Loc --      Pain Edu? --      Excl. in GC? --    Vitals:   06/11/21 2333 06/25/2021 0302  BP: (!) 156/76 139/72  Pulse: 64 72  Resp: 16 18  Temp: 97.7 F (36.5 C) 97.6 F (36.4 C)  SpO2: 95% 93%   Physical Exam Vitals and nursing note reviewed.  Constitutional:      Appearance: He is well-developed.  HENT:     Head: Normocephalic and atraumatic.     Right Ear: External ear normal.     Left Ear: External ear normal.     Nose: Nose normal.  Eyes:     Conjunctiva/sclera: Conjunctivae normal.  Cardiovascular:     Rate and Rhythm: Normal rate and regular rhythm.     Heart sounds: No murmur heard. Pulmonary:     Effort: Pulmonary effort is normal. No respiratory distress.     Breath sounds: Normal breath sounds.  Abdominal:     Palpations: Abdomen is soft.     Tenderness: There is no abdominal tenderness.  Musculoskeletal:     Cervical back: Neck supple.  Skin:    General: Skin is warm and dry.     Capillary Refill: Capillary refill takes less than 2 seconds.  Neurological:     Mental Status: He is alert and oriented to person, place, and time.  Psychiatric:        Mood and Affect: Mood normal.    Patient is weaker in his right upper extremity compared to his left upper extremity  which he seem to have full strength throughout in.  Sensation is intact to light touch all extremities.  Patient is able to wiggle his toes with very little movement in the bilateral extremities but is able to lift his heels off the bed.  Sensation is intact throughout the bilateral lower extremities.  2+ radial and DP  pulses.  Cranial nerves II through Grossly intact.  Rectal exam and and lower back deferred given patient examined in the triage area and hallway and known unstable spinal fracture on MRI.  He has some mild tenderness over the C-spine ____________________________________________   LABS (all labs ordered are listed, but only abnormal results are displayed)  Labs Reviewed  CBC WITH DIFFERENTIAL/PLATELET - Abnormal; Notable for the following components:      Result Value   WBC 12.8 (*)    Platelets 135 (*)    Neutro Abs 8.9 (*)    Monocytes Absolute 1.6 (*)    Abs Immature Granulocytes 0.08 (*)    All other components within normal limits  BASIC METABOLIC PANEL - Abnormal; Notable for the following components:   Glucose, Bld 153 (*)    BUN 25 (*)    Creatinine, Ser 1.26 (*)    Calcium 8.8 (*)    GFR, Estimated 60 (*)    All other components within normal limits  PROTIME-INR - Abnormal; Notable for the following components:   Prothrombin Time 25.6 (*)    INR 2.3 (*)    All other components within normal limits  URINALYSIS, COMPLETE (UACMP) WITH MICROSCOPIC - Abnormal; Notable for the following components:   Color, Urine YELLOW (*)    APPearance CLEAR (*)    Glucose, UA 50 (*)    Hgb urine dipstick MODERATE (*)    Ketones, ur 5 (*)    Protein, ur 30 (*)    All other components within normal limits  RESP PANEL BY RT-PCR (FLU A&B, COVID) ARPGX2  TYPE AND SCREEN   ____________________________________________  EKG  ____________________________________________  RADIOLOGY  ED MD interpretation: MRI L-spine concerning for an acute unstable fracture given and closed  spine at L1 through posterior left T12 Foramen and left T12-L1 facets and right L1 posterior elements with some slight anterior listhesis.  There is no retropulsion or spinal stenosis or obvious hematoma.  No other acute findings.  Official radiology report(s): MR LUMBAR SPINE WO CONTRAST  Addendum Date: 07/09/21   ADDENDUM REPORT: 07-09-2021 08:11 ADDENDUM: Study discussed by telephone with Dr. Sidney Ace on 2021/07/09 at 0905 hours. Electronically Signed   By: Odessa Fleming M.D.   On: Jul 09, 2021 08:11   Result Date: 2021/07/09 CLINICAL DATA:  75 year old male with low back pain after a fall. EXAM: MRI LUMBAR SPINE WITHOUT CONTRAST TECHNIQUE: Multiplanar, multisequence MR imaging of the lumbar spine was performed. No intravenous contrast was administered. COMPARISON:  Lumbar radiographs 06/10/2021. FINDINGS: Segmentation:  Normal on the comparison radiographs. Alignment: Straightening of lumbar lordosis. There is subtle anterolisthesis associated with a 3 column injury of the superior L1 vertebra, see below. Vertebrae: 3 column fracture with edema or hemorrhage tracking through the bony cleft of the superior L1 body, just subjacent to the L1 superior endplate. Fracture propagates through the bilateral posterior elements as seen on series 7, image 6, including the left T12-L1 facet joint. The left T12 neural foramen is directly involved. The L1 spinous process appears to remain intact. The left L1 pedicle appears to remain intact. Underlying spinal ankylosis. Mild longitudinal distraction of the fracture fragments. Slight anterolisthesis of T12 with respect to L1. No retropulsion or associated spinal stenosis. Lumbar interbody ankylosis suspected elsewhere related to flowing endplate osteophytes or syndesmophytes, which are bulky anteriorly at L4-L5. However, unclear whether there is bilateral SI joint ankylosis. No other marrow edema or acute osseous abnormality. Conus medullaris and cauda equina: Conus extends to  the  L1 level. No lower spinal cord or conus signal abnormality. Paraspinal and other soft tissues: Only mild paraspinal edema at the L1 fracture level. Bilateral paraspinal muscle atrophy. Negative visible abdominal viscera. 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 No lumbar spinal stenosis. IMPRESSION: 1. 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 The fracture propagates posteriorly through the left T12 neural foramen, left T12-L1 facets, and right L1 posterior elements. Slight anterolisthesis. No retropulsion or spinal stenosis. Recommend Spine Precautions and Spine Surgery consultation. 2. No other acute finding in the lumbar spine. 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 Electronically Signed: By: Odessa Fleming M.D. On: 06/22/2021 07:58    ____________________________________________   PROCEDURES  Procedure(s) performed (including Critical Care):  .Critical Care Performed by: Gilles Chiquito, MD Authorized by: Gilles Chiquito, MD   Critical care provider statement:    Critical care time (minutes):  45   Critical care was necessary to treat or prevent imminent or life-threatening deterioration of the following conditions:  Trauma   Critical care was time spent personally by me on the following activities:  Ordering and review of radiographic studies, ordering and review of laboratory studies, pulse oximetry, re-evaluation of patient's condition, review of old charts, examination of patient, discussions with consultants, development of treatment plan with patient or surrogate and ordering and performing treatments and interventions   I assumed direction of critical care for this patient from another provider in my specialty: no     Care discussed with: admitting provider     ____________________________________________   INITIAL IMPRESSION /  ASSESSMENT AND PLAN / ED COURSE      Patient presents with above-stated history exam for assessment of worsening low back pain rating towards left hip associated with worsening lower extremity weakness and inability to ambulate as well as constipation difficulty urinating over the last couple days after recent ground-level mechanical fall.  On arrival patient is afebrile hemodynamically stable.  Patient examined prior to being roomed due to MRI ordered in triage that showed unstable L-spine fracture.  He does seem very weak in his lower extremities and I deferred rectal or back exam given known L-spine unstable fracture and lack of privacy at time of exam.  Otherwise sensation seems intact and he has good pulses in all extremities.  He denies any interim falls or injuries or any other significant pain other than a little neck soreness that started when he put a pillow under his neck while waiting to be roomed.  Additional differential considerations for low back pain overall symptoms include fracture compressing spinal cord, hematoma with a lower suspicion at this time for acute infectious process or interim trauma.  I was able to review patient's work-up from this ED visit on 11 4/4 that included a CT head and C-spine showed no evidence of skull fracture, intracranial hemorrhage but did show on the C-spine some wedging at C6 vertebral body likely chronic although technically indeterminant with some anterior osteophytes between C3 and T2 and left foraminal impingement on C3-C4 to process that eccentric intervertebral spurring and left foraminal stenosis at T2-3.  MRI obtained today L-spine concerning for an acute unstable fracture given and closed spine at L1 through posterior left T12 Foramen and left T12-L1 facets and right L1 posterior elements with some slight anterior listhesis.  There is no retropulsion or spinal stenosis or obvious hematoma.  No other acute findings.  CBC with slight  leukocytosis at 12.8 down from 14.72 days ago.  No evidence of significant acute anemia and platelets are normal.  BMP without significant metabolic derangements or electrolyte derangements.  INR slightly elevated at 2.3.  This would be appropriate for patient's indication for stroke prevention history of A. Fib.  UA remarkable for some hemoglobin ketones and protein but no evidence of infection.  Given findings on history and exam as well as unstable fracture seen on MRI I did immediately consult neurosurgery Dr. Marcell Barlow who recommended reversing patient's INR admitting to hospital service for likely operative repair scheduled for today.  I will admit patient to medicine service for further evaluation and management.     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, initial encounter (HCC)  Anticoagulated    Medications  prothrombin complex conc human (KCENTRA) IVPB 1,593 Units (has no administration in time range)  lactated ringers infusion (has no administration in time range)  oxyCODONE-acetaminophen (PERCOCET/ROXICET) 5-325 MG per tablet 1 tablet (has no administration in time range)  morphine 2 MG/ML injection 2 mg (has no administration in time range)  methocarbamol (ROBAXIN) tablet 500 mg (has no administration in time range)  ondansetron (ZOFRAN) injection 4 mg (has no administration in time range)  acetaminophen (TYLENOL) tablet 650 mg (has no administration in time range)  hydrALAZINE (APRESOLINE) injection 5 mg (has no administration in time range)  0.9 %  sodium chloride infusion (has no administration in time range)  morphine 4 MG/ML injection 4 mg (4 mg Intravenous Given 07/06/2021 0835)  ondansetron (ZOFRAN) injection 4 mg (4 mg Intravenous Given 06/15/2021 6004)     ED Discharge Orders     None        Note:  This document was prepared  using Dragon voice recognition software and may include unintentional dictation errors.    Gilles Chiquito, MD 06/27/2021 0920    Gilles Chiquito, MD 06/08/2021 1001    Gilles Chiquito, MD 06/25/2021 [phone]

## 2021-06-12 NOTE — ED Notes (Signed)
Pt still in MRI. Neurosurgeon has already seen pt.

## 2021-06-12 NOTE — ED Notes (Addendum)
Pt flat on back, foley in place. Obtaining VS and EKG. K Centra infusing. Surgeon to return to bedside to talk to pt about surgeries. Pt has bruising around both eyes, new since fall 2 days ago.

## 2021-06-12 NOTE — ED Notes (Signed)
Pt sleeping.  Lab called to verify if covid swab was received.

## 2021-06-12 NOTE — Anesthesia Procedure Notes (Signed)
Arterial Line Insertion Start/EndNov 11, 2022 5:51 PM, 06/17/2021 5:55 PM Performed by: Sheila Oats, MD, anesthesiologist  Preanesthetic checklist: patient identified, IV checked, site marked, risks and benefits discussed, surgical consent, monitors and equipment checked, pre-op evaluation, timeout performed and anesthesia consent Patient sedated Right, radial was placed Catheter size: 20 G Maximum sterile barriers used  and Seldinger technique used  Attempts: 2 Procedure performed using ultrasound guided technique. Ultrasound Notes:anatomy identified, needle tip was noted to be adjacent to the nerve/plexus identified and no ultrasound evidence of intravascular and/or intraneural injection Following insertion, dressing applied. Post procedure assessment: normal

## 2021-06-12 NOTE — ED Notes (Signed)
Surgeon was back at bedside discussing MRI findings and surgeries with pt and 2 family members (son and daughter).

## 2021-06-12 NOTE — ED Notes (Signed)
Lab did not receive covid swab. Redone and sent now.

## 2021-06-13 ENCOUNTER — Inpatient Hospital Stay: Payer: Medicare Other

## 2021-06-13 DIAGNOSIS — S32012A Unstable burst fracture of first lumbar vertebra, initial encounter for closed fracture: Secondary | ICD-10-CM | POA: Diagnosis not present

## 2021-06-13 DIAGNOSIS — S064XAA Epidural hemorrhage with loss of consciousness status unknown, initial encounter: Secondary | ICD-10-CM

## 2021-06-13 DIAGNOSIS — S12400A Unspecified displaced fracture of fifth cervical vertebra, initial encounter for closed fracture: Secondary | ICD-10-CM | POA: Diagnosis not present

## 2021-06-13 DIAGNOSIS — T794XXA Traumatic shock, initial encounter: Secondary | ICD-10-CM

## 2021-06-13 LAB — BASIC METABOLIC PANEL
Anion gap: <0 — ABNORMAL LOW (ref 5–15)
BUN: 25 mg/dL — ABNORMAL HIGH (ref 8–23)
CO2: 27 mmol/L (ref 22–32)
Calcium: 8 mg/dL — ABNORMAL LOW (ref 8.9–10.3)
Chloride: 112 mmol/L — ABNORMAL HIGH (ref 98–111)
Creatinine, Ser: 1.06 mg/dL (ref 0.61–1.24)
GFR, Estimated: >60 mL/min (ref >60)
Glucose, Bld: 174 mg/dL — ABNORMAL HIGH (ref 70–99)
Potassium: 4.7 mmol/L (ref 3.5–5.1)
Sodium: 137 mmol/L (ref 135–145)

## 2021-06-13 LAB — GLUCOSE, CAPILLARY
Glucose-Capillary: 152 mg/dL — ABNORMAL HIGH (ref 70–99)
Glucose-Capillary: 187 mg/dL — ABNORMAL HIGH (ref 70–99)
Glucose-Capillary: 189 mg/dL — ABNORMAL HIGH (ref 70–99)

## 2021-06-13 LAB — CBC
HCT: 30.3 % — ABNORMAL LOW (ref 39.0–52.0)
Hemoglobin: 10.3 g/dL — ABNORMAL LOW (ref 13.0–17.0)
MCH: 32.2 pg (ref 26.0–34.0)
MCHC: 34 g/dL (ref 30.0–36.0)
MCV: 94.7 fL (ref 80.0–100.0)
Platelets: 146 10*3/uL — ABNORMAL LOW (ref 150–400)
RBC: 3.2 MIL/uL — ABNORMAL LOW (ref 4.22–5.81)
RDW: 13.4 % (ref 11.5–15.5)
WBC: 19.9 10*3/uL — ABNORMAL HIGH (ref 4.0–10.5)
nRBC: 0 % (ref 0.0–0.2)

## 2021-06-13 LAB — PROTIME-INR
INR: 2.1 — ABNORMAL HIGH (ref 0.8–1.2)
INR: 2.2 — ABNORMAL HIGH (ref 0.8–1.2)
INR: 1.8 — ABNORMAL HIGH (ref 0.8–1.2)
Prothrombin Time: 24 seconds — ABNORMAL HIGH (ref 11.4–15.2)
Prothrombin Time: 24.6 seconds — ABNORMAL HIGH (ref 11.4–15.2)
Prothrombin Time: 20.6 seconds — ABNORMAL HIGH (ref 11.4–15.2)

## 2021-06-13 IMAGING — CT CT L SPINE W/O CM
3 of 4 series · 11 of 33 positions shown, 13 images · non-contrast
Comparison: Thoracolumbar CT, lumbar MRI yesterday.

CLINICAL DATA: 75-year-old male with spinal ankylosis, C5-C6 and L1
acute fractures. Status post surgery.

EXAM:
CT LUMBAR SPINE WITHOUT CONTRAST
TECHNIQUE: Multidetector CT imaging of the lumbar spine was performed without
intravenous contrast administration. Multiplanar CT image
reconstructions were also generated.

[Series 5: multi disc · axial · 0.29mm/px · z∈[-1004,-856]mm · 3 of 105 slices shown, 4 images]
[im 21/105  soft-tissue]
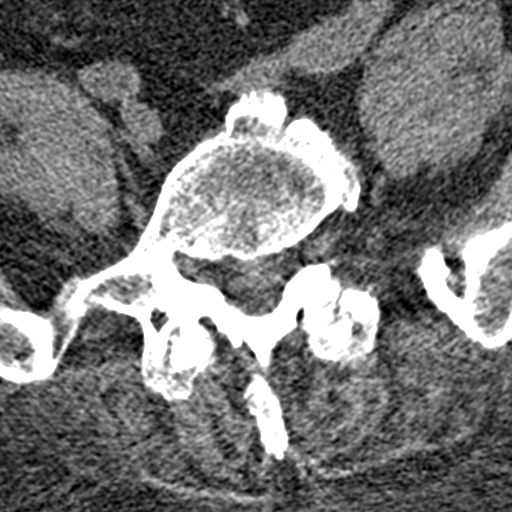
[im 21/105  bone]
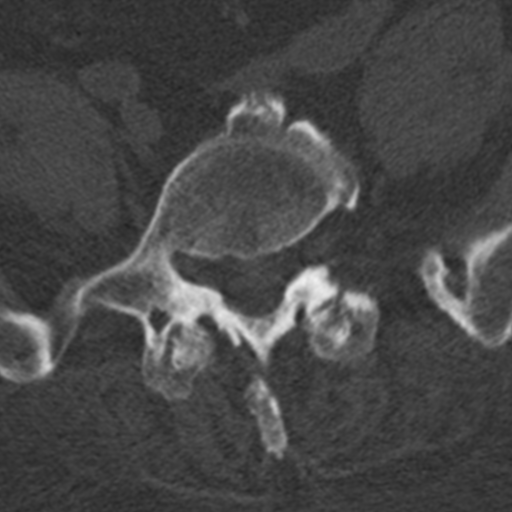
[im 63/105  bone]
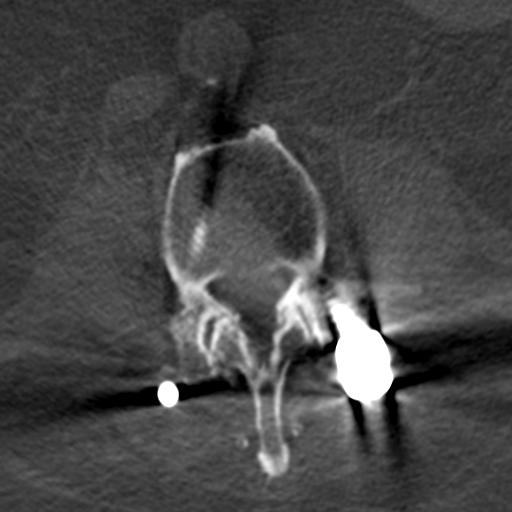
[im 84/105  bone]
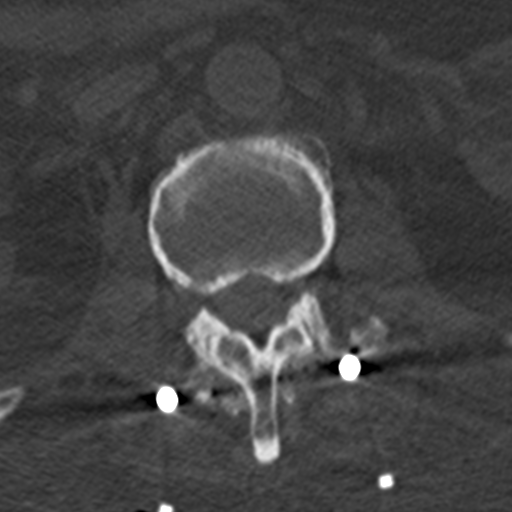

[Series 6: coronal bone · coronal · 0.46mm/px · 3 of 94 slices shown]
[im 19/94  bone]
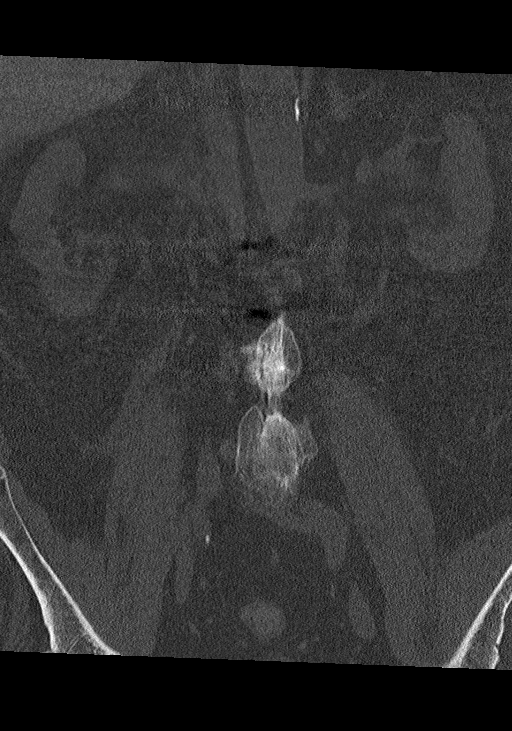
[im 38/94  bone]
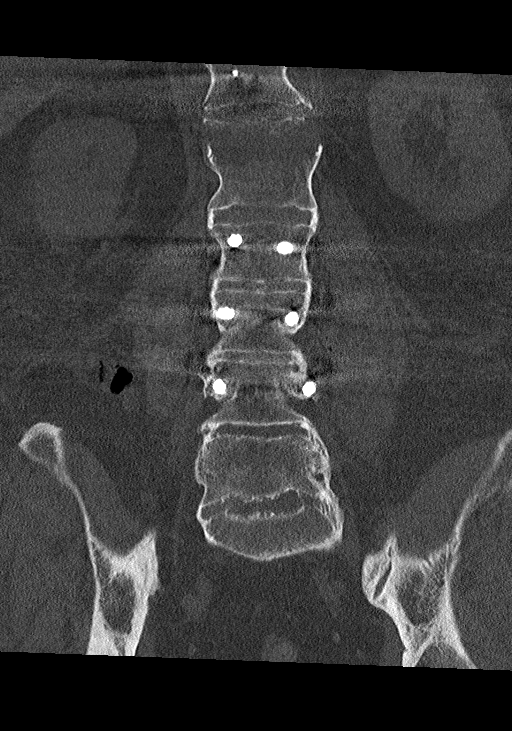
[im 56/94  bone]
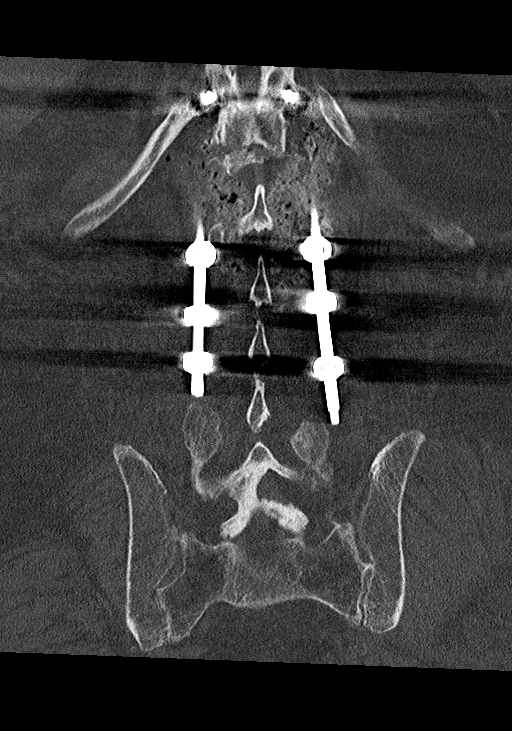

[Series 9: sagittal st · sagittal · 0.37mm/px · 5 of 120 slices shown, 6 images]
[im 40/120  bone]
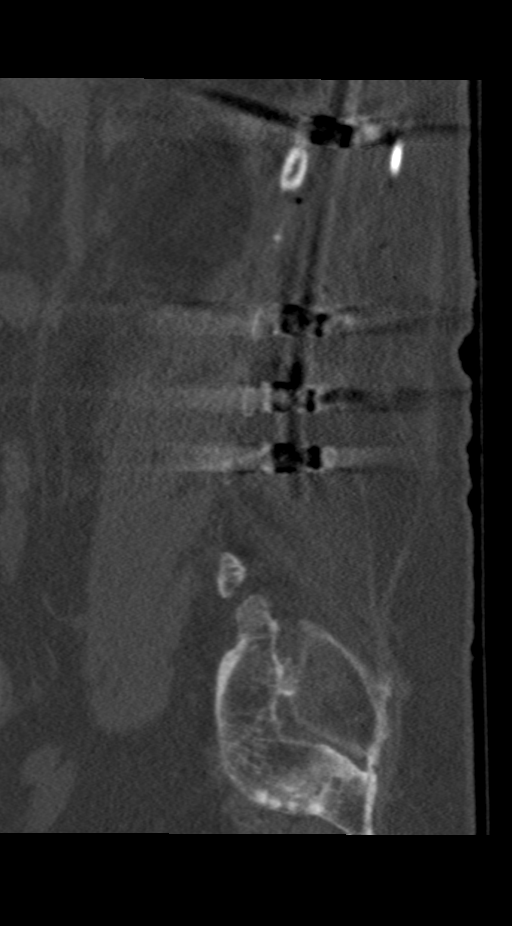
[im 50/120  bone]
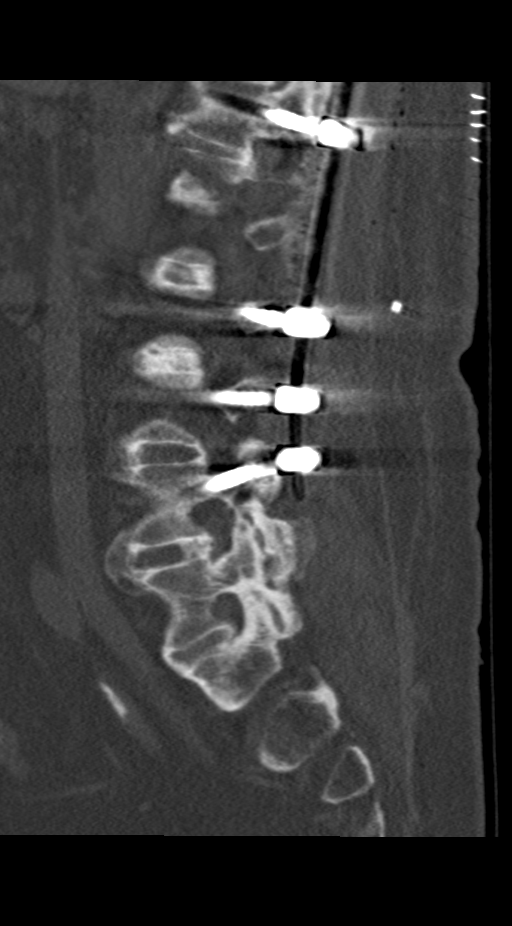
[im 60/120  soft-tissue]
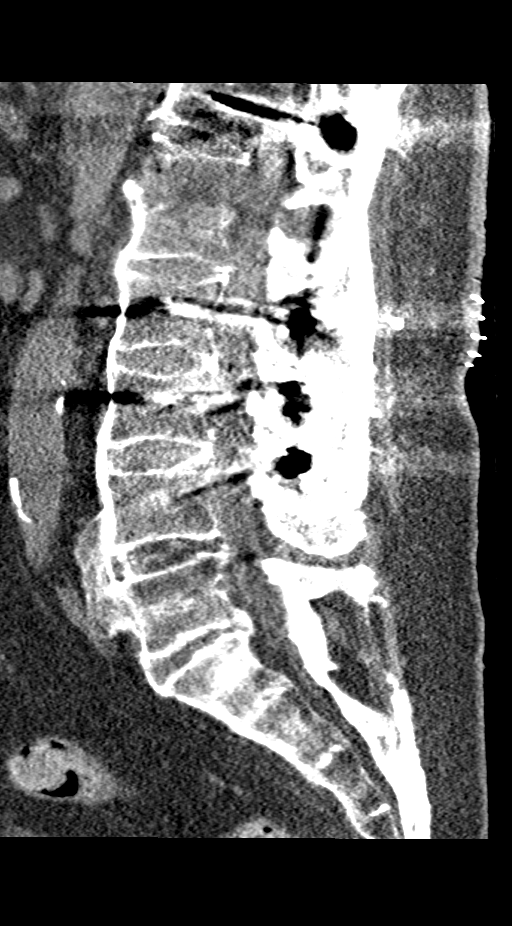
[im 60/120  bone]
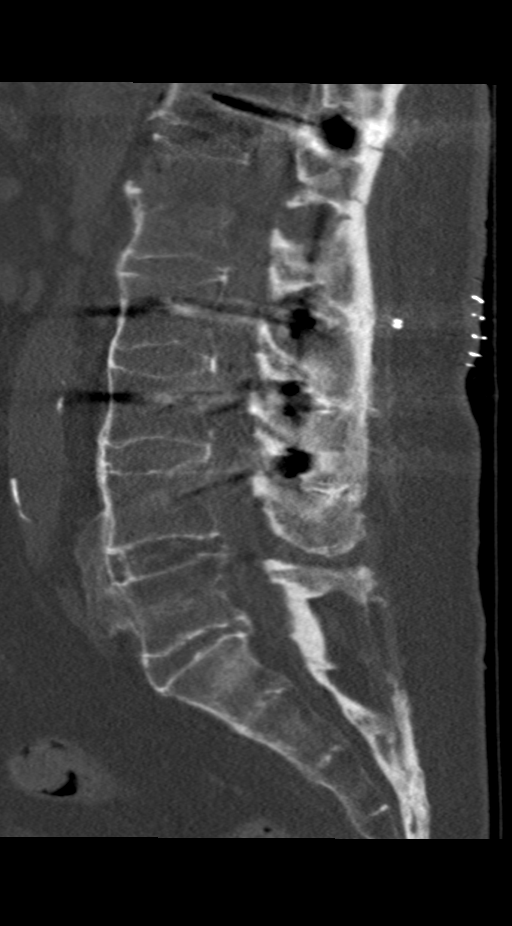
[im 70/120  bone]
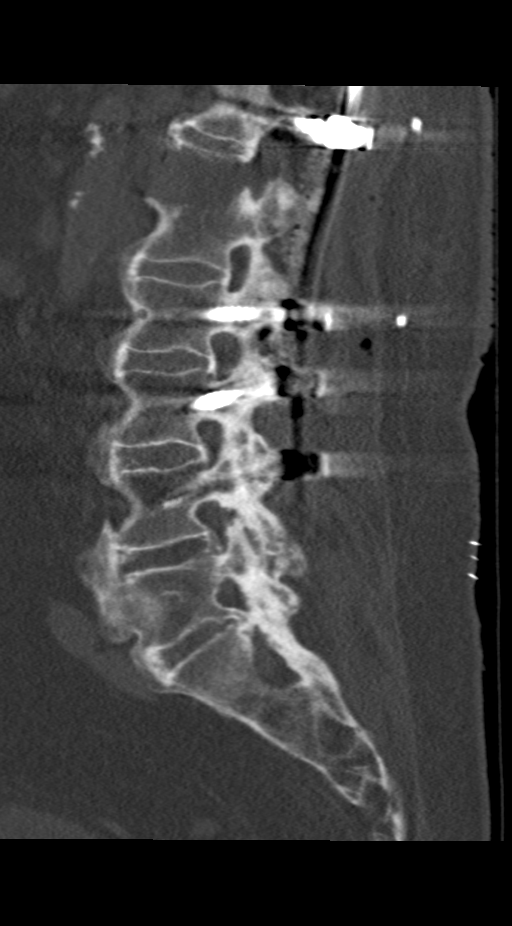
[im 80/120  bone]
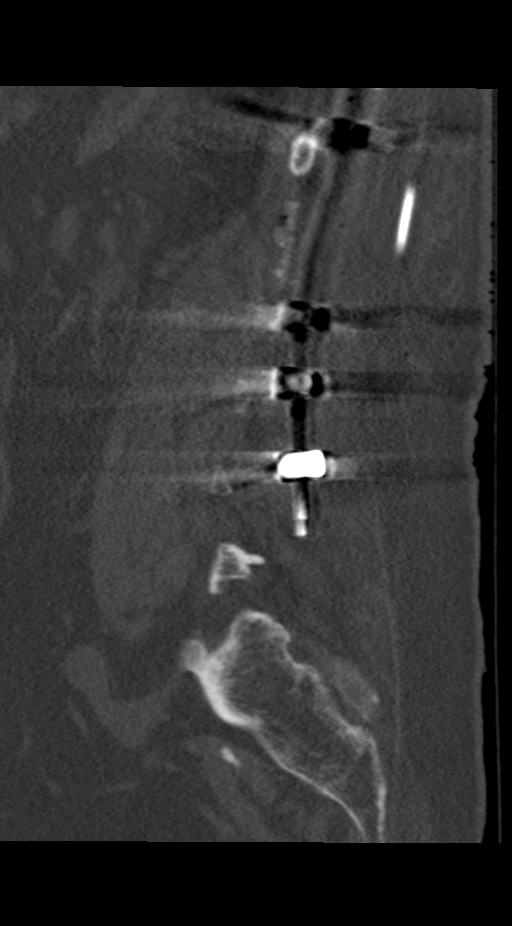

[11 of 33 positions shown; findings below may reference images not displayed]

FINDINGS: Segmentation: Normal.

Alignment: Stable since yesterday.

Vertebrae: Distracted, horizontal fracture through the superior L1
level appears stable aside from small new volume of gas in an around
the fracture cleft. Addition of posterior paraspinal fusion hardware
now, see details below. Underlying osteopenia. Disrupted T12-L1
ankylosis. But maintained L1 through S1 ankylosis with no new
osseous abnormality identified.

Paraspinal and other soft tissues: Postoperative changes to the
posterior paraspinal soft tissues. A postoperative drain is looped
over the T12 and L1 posterior paraspinal muscles, L1 spinous
process. It appears to descend from the thoracic level. Small volume
of regional soft tissue gas.

Stable visible noncontrast abdominal viscera.

Disc levels:

T12-L1: Bilateral T12 pedicle screws and posterior connecting rods
with no adverse features. Some posterior bone graft material also in
place.

L1-L2:  No L1 hardware.  Posterior, laminar bone graft material.

L2-L3: Bilateral L2 and L3 pedicle screws. Posterior bone graft
material, primarily at L2. No adverse features.

L3-L4: Bilateral L4 pedicle screws. Connecting rods terminates at
L4. No adverse features.

L4-L5:  Stable aside from L4 hardware.

L5-S1:  Stable.
IMPRESSION: 1. Spinal ankylosis with stable configuration of superior L1
fracture with distraction.
2. Status post T12 through L4 posterior spinal fusion hardware
placement with no adverse features. Posterior postoperative drain in
place.

3. No new osseous abnormality in the lumbar spine.

## 2021-06-13 IMAGING — CT CT CERVICAL SPINE W/O CM
3 of 4 series · 12 of 33 positions shown, 14 images · non-contrast
Comparison: Cervical spine MRI [DATE], CT [DATE].

CLINICAL DATA: 75-year-old male with spinal ankylosis, C5-C6 and L1
acute fractures. Status post surgery.

EXAM:
CT CERVICAL SPINE WITHOUT CONTRAST
TECHNIQUE: Multidetector CT imaging of the cervical spine was performed without
intravenous contrast. Multiplanar CT image reconstructions were also
generated.

[Series 3: sagittal bone · sagittal · 0.32mm/px · 5 of 97 slices shown, 6 images]
[im 33/97  bone]
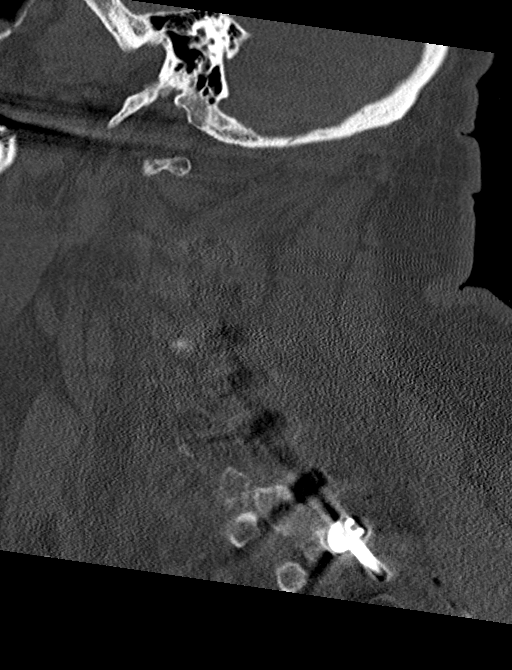
[im 41/97  bone]
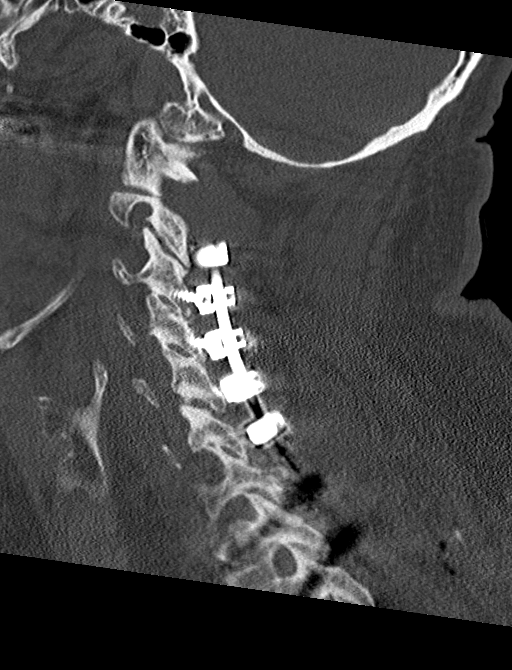
[im 49/97  soft-tissue]
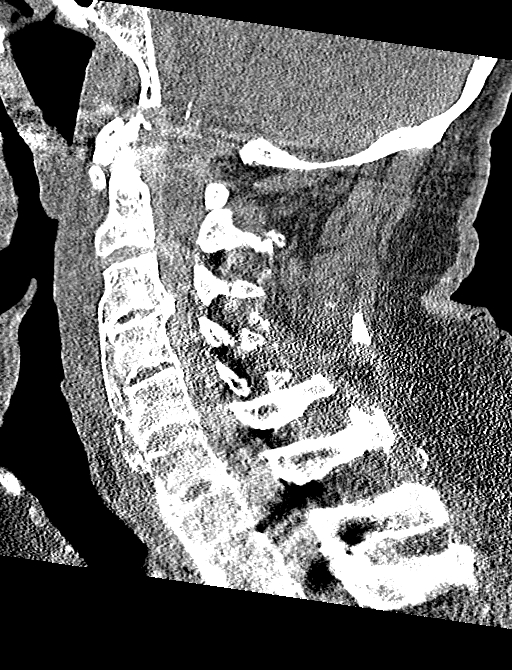
[im 49/97  bone]
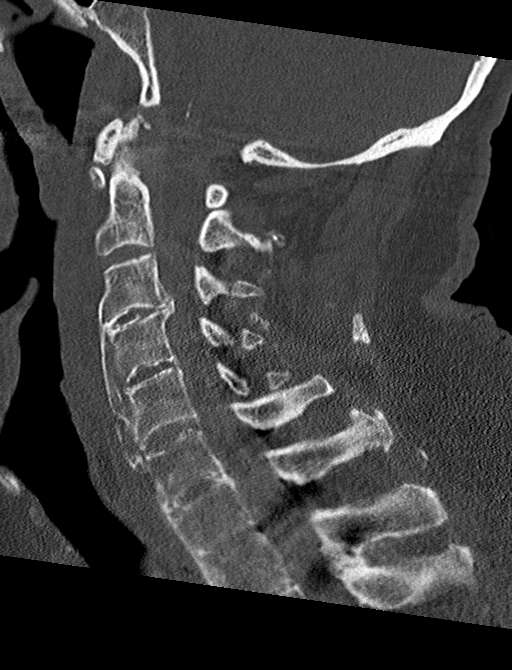
[im 57/97  bone]
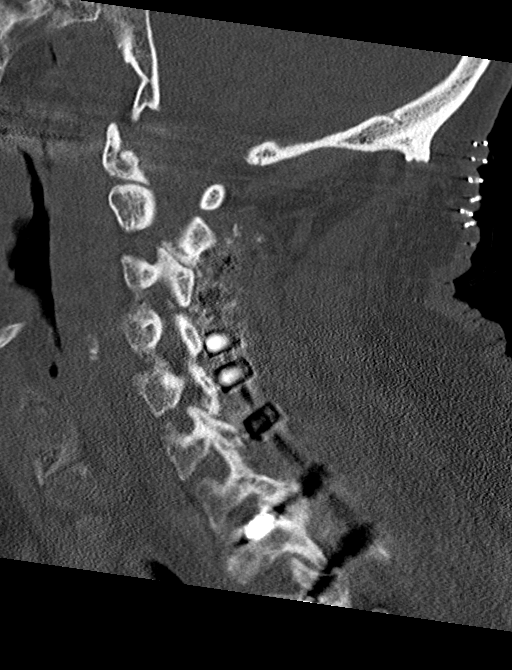
[im 65/97  bone]
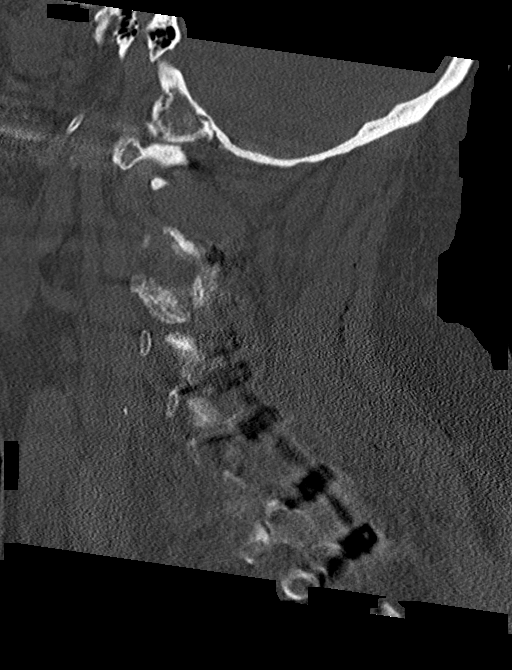

[Series 6: coronal bone · coronal · 0.38mm/px · 3 of 82 slices shown]
[im 17/82  bone]
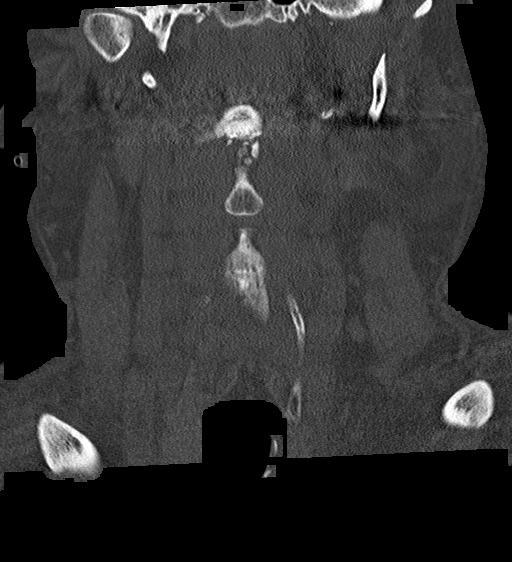
[im 33/82  bone]
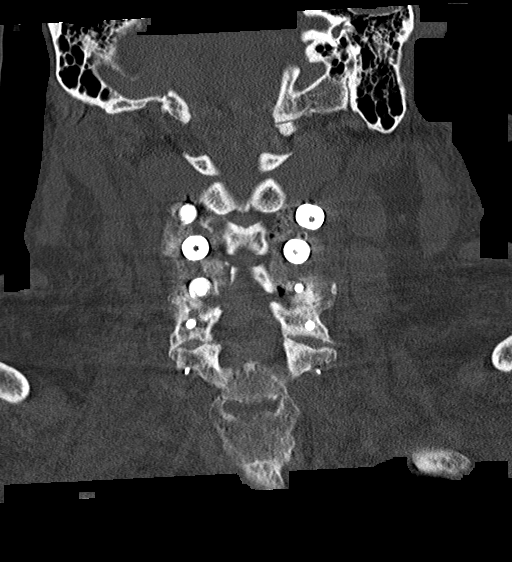
[im 49/82  bone]
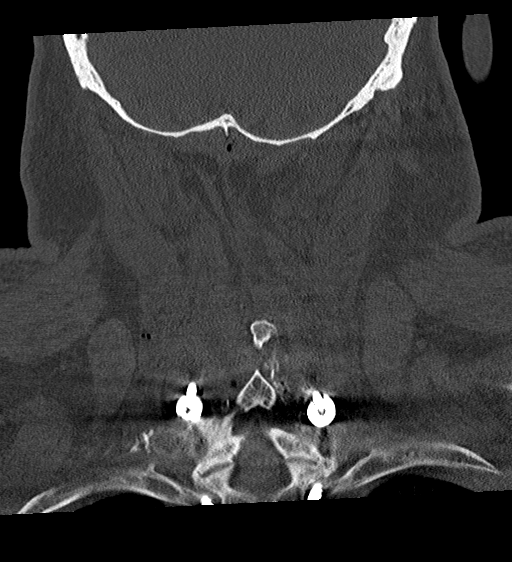

[Series 7: orthogonal bone · axial · 0.32mm/px · z∈[-561,-416]mm · 4 of 103 slices shown, 5 images]
[im 15/103  soft-tissue]
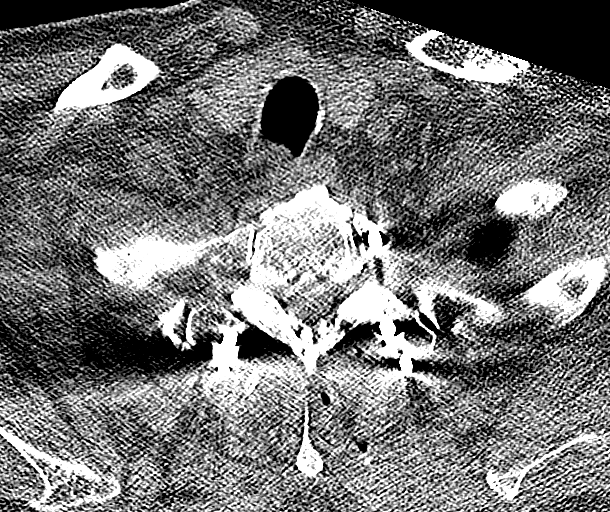
[im 15/103  bone]
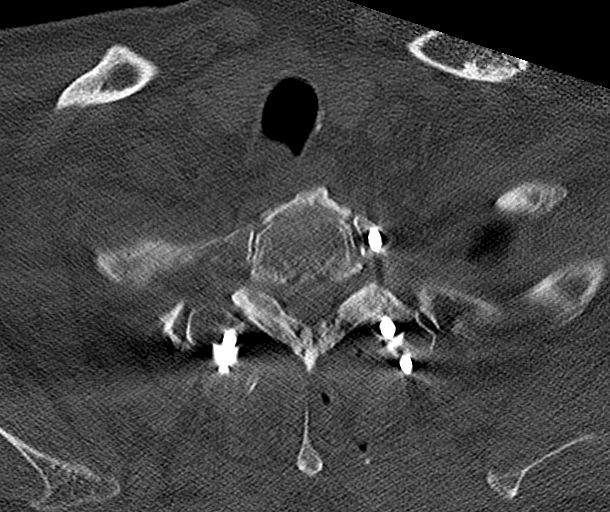
[im 44/103  bone]
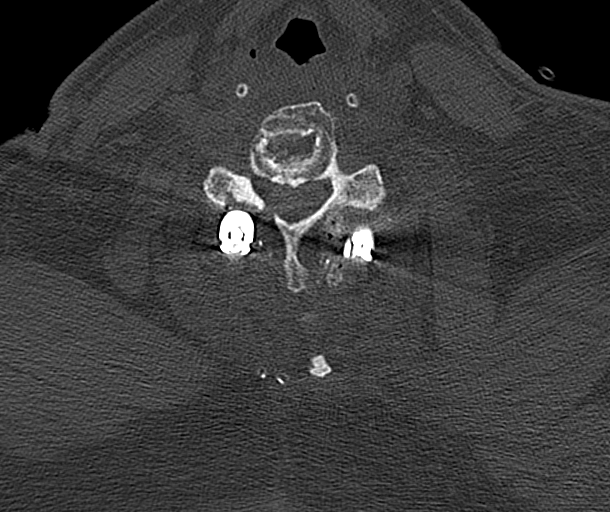
[im 59/103  bone]
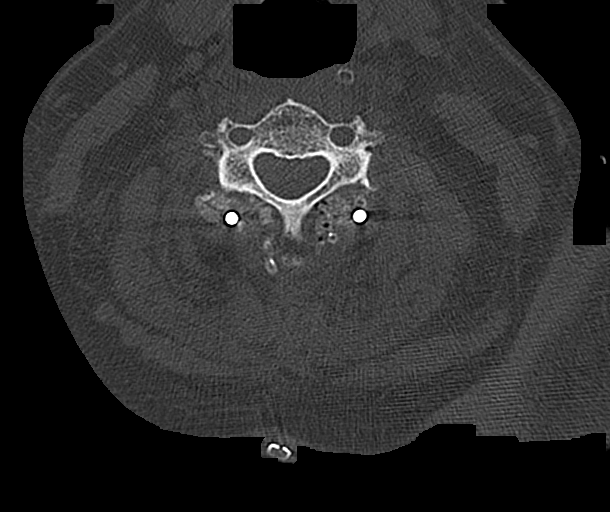
[im 88/103  bone]
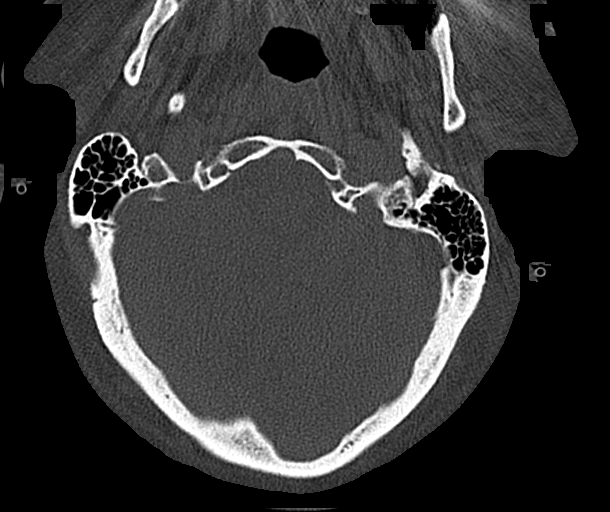

[12 of 33 positions shown; findings below may reference images not displayed]

FINDINGS: Alignment: Normalized cervical lordosis. Cervicothoracic junction
alignment is within normal limits. Relatively normal posterior
element alignment bilaterally.

Skull base and vertebrae: Visualized skull base is intact. No
atlanto-occipital dissociation. Postoperative changes are detailed
below. C1 and C2 appears stable and intact. C3 and C4 appear intact,
ankylosed.

Horizontal fracture through the C5-C6 level, including anterior
bridging osteophytes and the C6 superior endplate. Mild widening of
both C5-C6 facets now, but improved alignment.

C7 and T1 appear intact, ankylosed.

Soft tissues and spinal canal: Spinal epidural hematoma seen by MRI
yesterday is not apparent. There is mild prevertebral soft tissue
edema. Postoperative changes to the posterior paraspinal soft
tissues are noted including a small volume of soft tissue gas. And a
postoperative drain is in place tracking up from the thoracic spine,
looping along the cervical lamina and spinous processes.

Otherwise negative visible noncontrast neck soft tissues.

Disc levels:  C1-C2: Maintained alignment.

C2-C3: Bilateral posterior laminar hardware is new. No adverse
features.

C3-C4: Bilateral laminar hardware is new. Right C4 laminectomy. No
adverse features.

C4-C5: Bilateral laminar hardware is new. Right C5 laminectomy. No
adverse features.

C5-C6:  Bilateral laminar hardware is new.  No adverse features.

C6-C7:  C6 laminar hardware.  No hardware at C7.

C7-T1: No hardware at C7. Bilateral pedicle screws at both T1 and
T2. At both levels the left-side pedicle screws are lateral to the
pedicles but traversing the posterior elements. The right T1 pedicle
screw is slightly lateral but seated.

Upper chest: Negative visible noncontrast posterior fossa.

Other: Negative visible lung apices.
IMPRESSION: 1. C5-C6 unstable fracture through ankylosed spine with interval
right side laminectomy at both levels, and placement of posterior C2
through T2 fusion hardware. Normalized cervical lordosis. Slightly
lateral positioning of T1 and T2 pedicle screws. No other adverse
features.
2. Posterior postoperative drain in place, tracking up from the
thoracic spine level.
3. No new osseous abnormality identified.

## 2021-06-13 IMAGING — MR MR CERVICAL SPINE W/O CM
6 series · 35 of 48 positions shown · non-contrast
Comparison: Postoperative cervical spine CT [TM] hours today.
Preoperative cervical spine MRI yesterday.

CLINICAL DATA: 75-year-old male with spinal ankylosis, C5-C6 and L1
acute fractures. Status post surgery.

EXAM:
MRI CERVICAL SPINE WITHOUT CONTRAST
TECHNIQUE: Multiplanar, multisequence MR imaging of the cervical spine was
performed. No intravenous contrast was administered.

[Series 5: T2 · sagittal · 3.0mm · 0.62mm/px · 6 of 16 slices shown (1 of 2)]
[im 1/16]
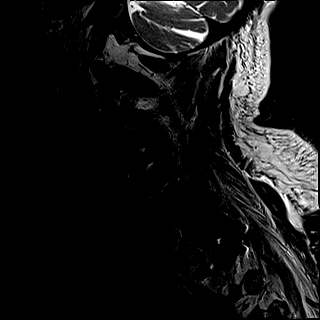
[im 4/16]
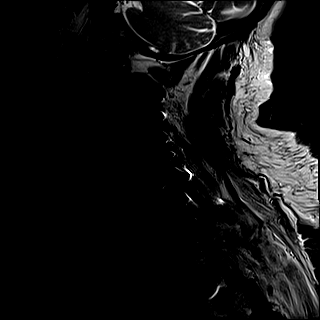
[im 7/16]
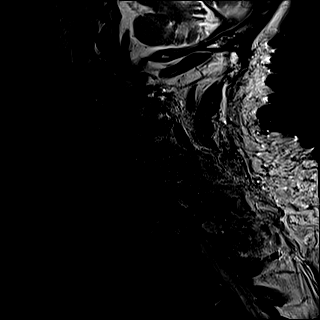
[im 10/16]
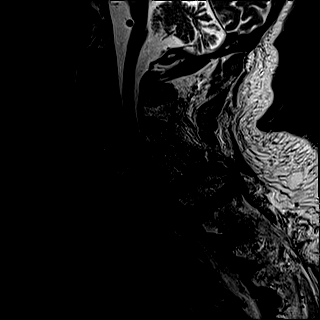
[im 13/16]
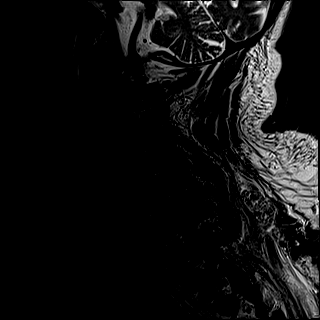
[im 16/16]
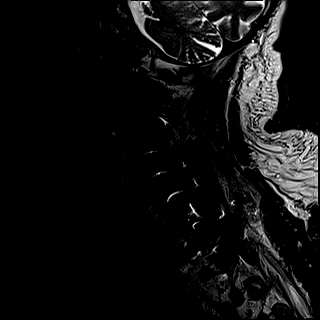

[Series 6: FLAIR · sagittal · 3.0mm · 0.78mm/px · 6 of 16 slices shown]
[im 1/16]
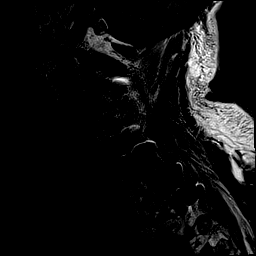
[im 4/16]
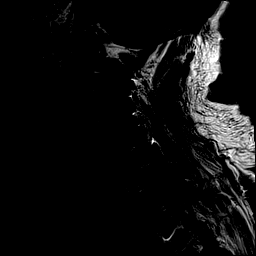
[im 7/16]
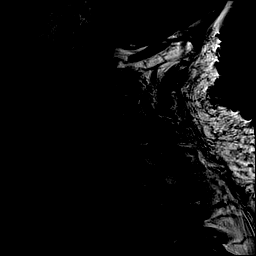
[im 10/16]
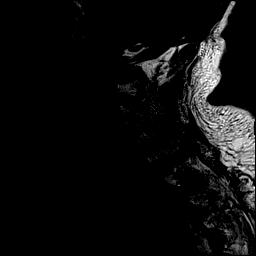
[im 13/16]
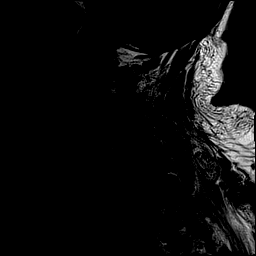
[im 16/16]
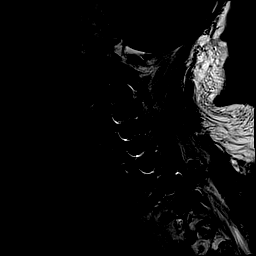

[Series 7: STIR · sagittal · 3.0mm · 0.62mm/px · 6 of 16 slices shown]
[im 1/16]
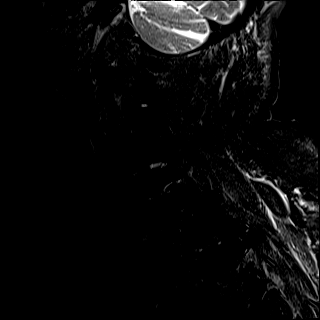
[im 4/16]
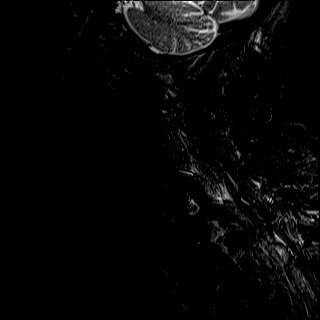
[im 7/16]
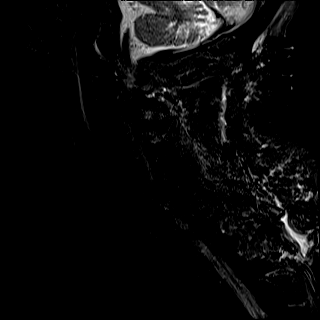
[im 10/16]
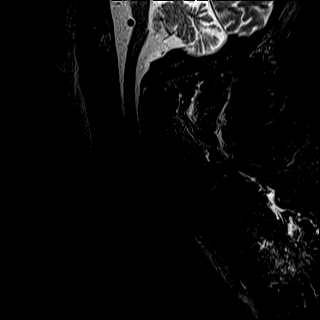
[im 13/16]
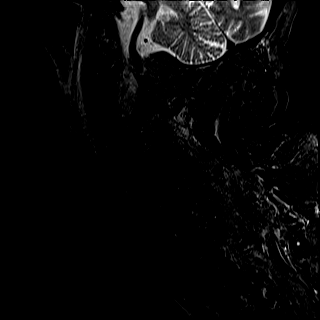
[im 16/16]
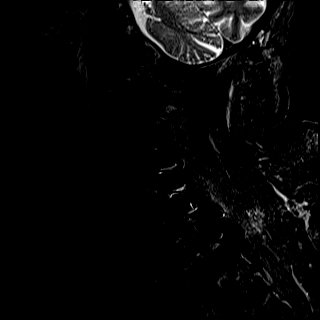

[Series 8: T2 · axial · 3.5mm · 0.70mm/px · z∈[-70,+36]mm · 8 of 30 slices shown (2 of 2)]
[im 1/30]
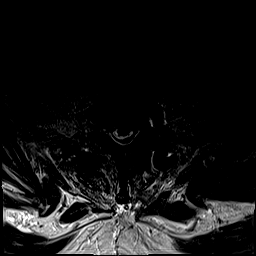
[im 4/30]
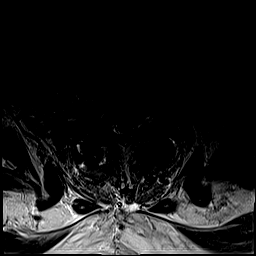
[im 10/30]
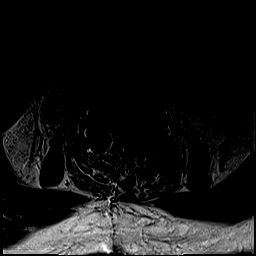
[im 13/30]
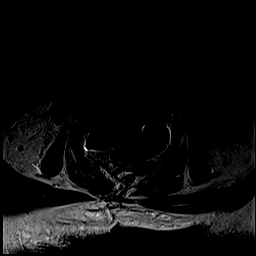
[im 17/30]
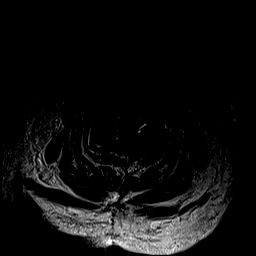
[im 20/30]
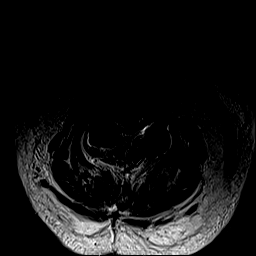
[im 26/30]
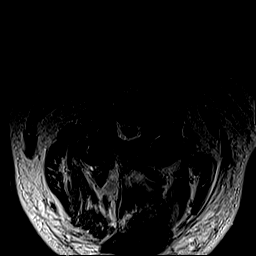
[im 30/30]
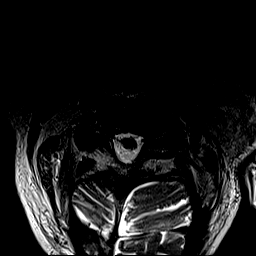

[Series 9: ax mpgr · axial · 3.5mm · 0.35mm/px · z∈[-70,+36]mm · 8 of 30 slices shown (1 of 2)]
[im 1/30]
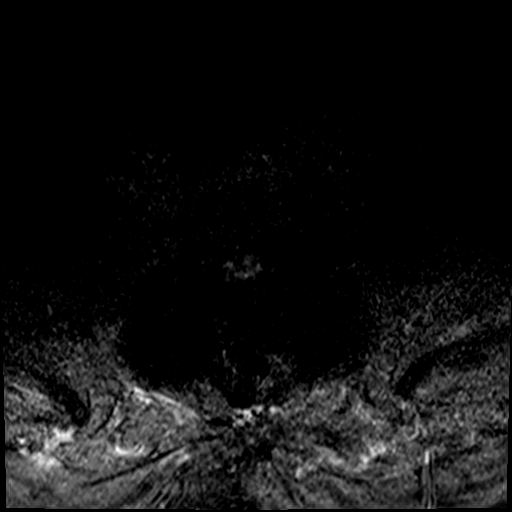
[im 4/30]
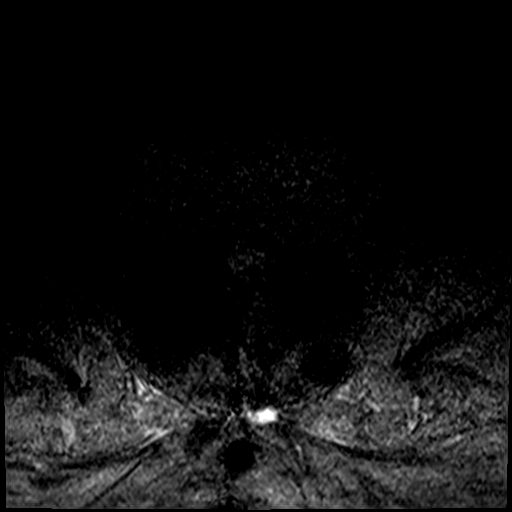
[im 10/30]
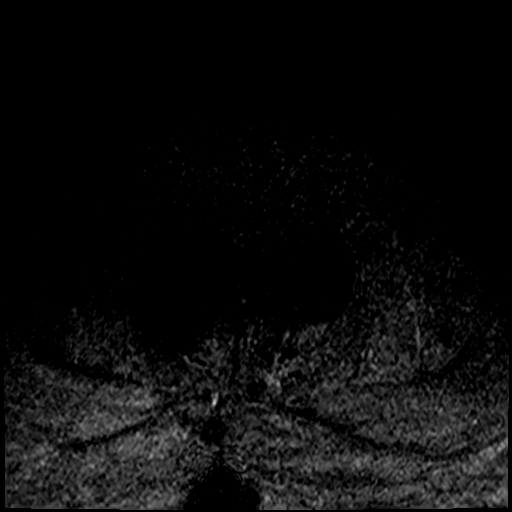
[im 13/30]
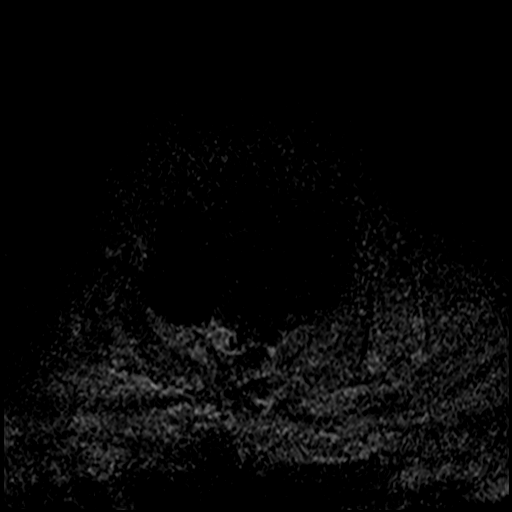
[im 17/30]
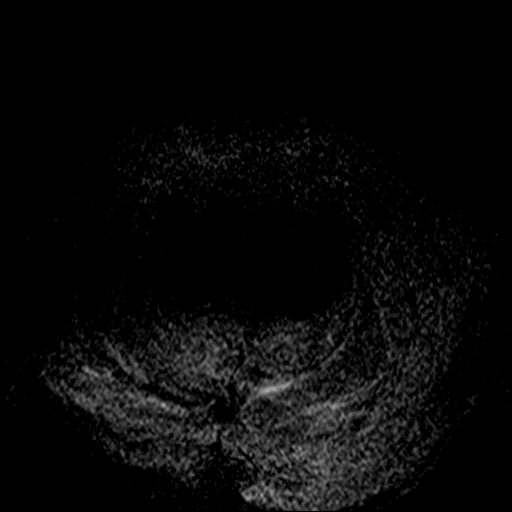
[im 20/30]
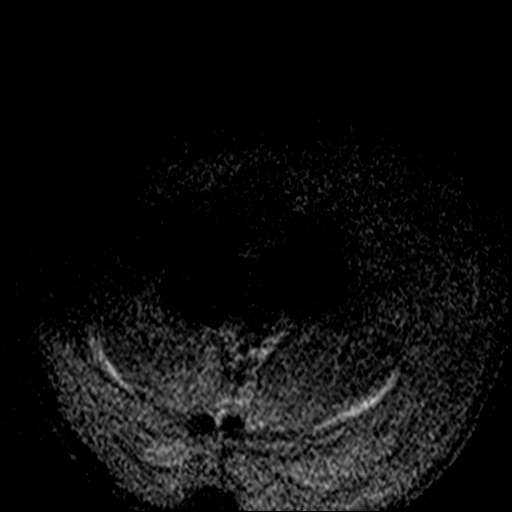
[im 26/30]
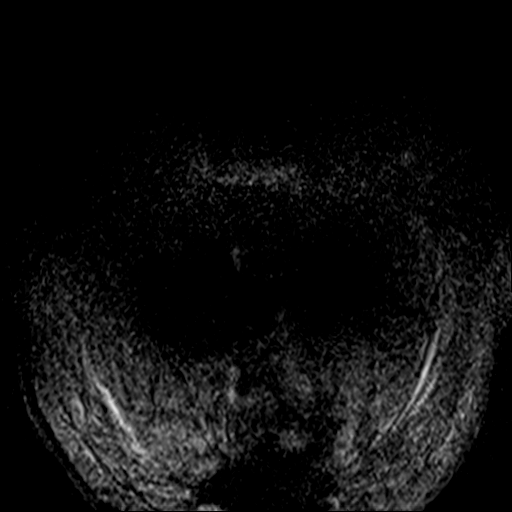
[im 30/30]
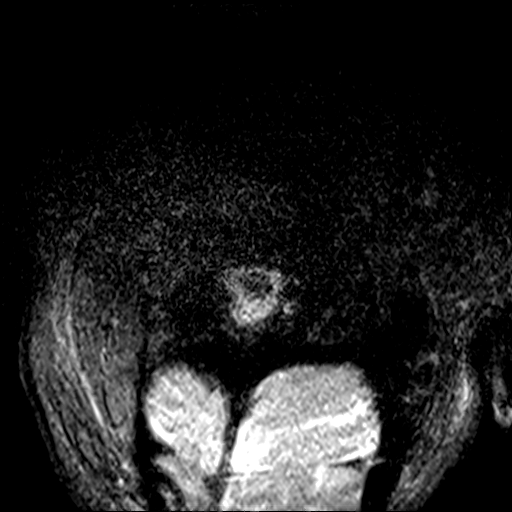

[Series 14: ax mpgr · axial · 3.5mm · 0.35mm/px · 1 of 30 slices shown (2 of 2)]
[im 1/30]
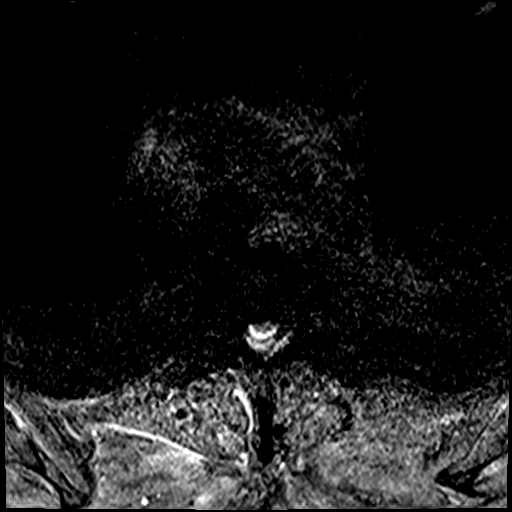

[35 of 48 positions shown; findings below may reference images not displayed]

FINDINGS: Alignment: Normalized cervical lordosis.

Vertebrae: Dark marrow signal throughout the visible spine in the
setting of chronic ankylosis. Superimposed bilateral posterior
paraspinal susceptibility artifact from new laminar hardware. Subtle
heterogeneity now in the C5-C6 disc space and C6 superior body.

Cord: Improved appearance since yesterday, with decreased but not
resolved posterior epidural blood. Residual now most apparent at the
C7 level (up to 6 mm thickness there series 8, image 27 and series
5, image 9.

And there is a new small volume of ventral epidural space fluid
which could be hematoma or postoperative seroma. This is centered at
C5-C6 (series 7, image 9), 1-2 mm in thickness. Some cord detail is
diminished due to the posterior susceptibility artifact now. But
there is no convincing cord contusion or edema.

Posterior Fossa, vertebral arteries, paraspinal tissues:
Cervicomedullary junction is within normal limits. Negative
posterior fossa. Postoperative changes to the posterior paraspinal
soft tissues. Midline suboccipital and bilateral paraspinal muscle
STIR hyperintensity has regressed but not resolved.

Disc levels:

Improved spinal canal patency. Regressed but not fully resolved
spinal stenosis C4-C5 through C7-T1, mild.
IMPRESSION: 1. Interval postoperative changes to the cervical spine as seen by
CT this morning. Regressed but not resolved posterior epidural blood
(residual at C7), small new ventral epidural space hematoma or
seroma (C5-C6), and improved spinal canal patency but mild residual
spinal stenosis.
2. No convincing spinal cord contusion or edema.
3. Regressed suboccipital and bilateral paraspinal muscle edema.

## 2021-06-13 MED ORDER — GLYCOPYRROLATE 0.2 MG/ML IJ SOLN: 0.2000 mg | INTRAMUSCULAR | Status: DC | PRN

## 2021-06-13 MED ORDER — PROTHROMBIN COMPLEX CONC HUMAN 500 UNITS IV KIT
1593.0000 [IU] | PACK | Status: AC
  Administered 2021-06-13: 1593 [IU] via INTRAVENOUS
  Filled 2021-06-13: qty 1593

## 2021-06-13 MED ORDER — GLYCOPYRROLATE 1 MG PO TABS
1.0000 mg | ORAL_TABLET | ORAL | Status: DC | PRN
  Filled 2021-06-13: qty 1

## 2021-06-13 MED ORDER — HALOPERIDOL 0.5 MG PO TABS
0.5000 mg | ORAL_TABLET | ORAL | Status: DC | PRN
  Filled 2021-06-13: qty 1

## 2021-06-13 MED ORDER — ONDANSETRON HCL 4 MG/2ML IJ SOLN: 4.0000 mg | Freq: Four times a day (QID) | INTRAMUSCULAR | Status: DC | PRN

## 2021-06-13 MED ORDER — HALOPERIDOL LACTATE 5 MG/ML IJ SOLN: 0.5000 mg | INTRAMUSCULAR | Status: DC | PRN

## 2021-06-13 MED ORDER — ATENOLOL 25 MG PO TABS: 25.0000 mg | ORAL_TABLET | Freq: Every day | ORAL | Status: DC

## 2021-06-13 MED ORDER — LISINOPRIL 20 MG PO TABS: 40.0000 mg | ORAL_TABLET | Freq: Every day | ORAL | Status: DC

## 2021-06-13 MED ORDER — ACETAMINOPHEN 650 MG RE SUPP: 650.0000 mg | Freq: Four times a day (QID) | RECTAL | Status: DC | PRN

## 2021-06-13 MED ORDER — ONDANSETRON 4 MG PO TBDP
4.0000 mg | ORAL_TABLET | Freq: Four times a day (QID) | ORAL | Status: DC | PRN
  Filled 2021-06-13: qty 1

## 2021-06-13 MED ORDER — BIOTENE DRY MOUTH MT LIQD: 15.0000 mL | OROMUCOSAL | Status: DC | PRN

## 2021-06-13 MED ORDER — ACETAMINOPHEN 325 MG PO TABS: 650.0000 mg | ORAL_TABLET | Freq: Four times a day (QID) | ORAL | Status: DC | PRN

## 2021-06-13 MED ORDER — POLYVINYL ALCOHOL 1.4 % OP SOLN
1.0000 [drp] | Freq: Four times a day (QID) | OPHTHALMIC | Status: DC | PRN
  Filled 2021-06-13: qty 15

## 2021-06-13 MED ORDER — HALOPERIDOL LACTATE 2 MG/ML PO CONC
0.5000 mg | ORAL | Status: DC | PRN
  Filled 2021-06-13: qty 0.3

## 2021-06-13 MED ORDER — MORPHINE SULFATE (PF) 2 MG/ML IV SOLN: 1.0000 mg | INTRAVENOUS | Status: DC | PRN

## 2021-06-13 MED ORDER — CHLORHEXIDINE GLUCONATE CLOTH 2 % EX PADS: 6.0000 | MEDICATED_PAD | Freq: Every day | CUTANEOUS | Status: DC

## 2021-06-13 MED ORDER — ATROPINE SULFATE 1 MG/10ML IJ SOSY
PREFILLED_SYRINGE | INTRAMUSCULAR | Status: AC
  Filled 2021-06-13: qty 10

## 2021-06-13 MED ORDER — NOREPINEPHRINE 4 MG/250ML-% IV SOLN
INTRAVENOUS | Status: AC
  Filled 2021-06-13: qty 250

## 2021-06-14 ENCOUNTER — Encounter: Payer: Self-pay | Admitting: Neurosurgery

## 2021-07-07 NOTE — Progress Notes (Signed)
   06/08/2021 1202  Clinical Encounter Type  Visited With Patient  Visit Type Initial  Referral From Nurse  Consult/Referral To Chaplain  Spiritual Encounters  Spiritual Needs Grief support;Emotional  Chaplain Larrell Rapozo responded to a page in ICU 7, Nurses secretary advising that pt in ICU 7 had died. Chaplain Oleta Mouse called priest several times to see if they could come out. Approximately 1200 father Oswaldo Done from [employer] returned my call and he stated, "that they do not usually come out for the death of a patient, they do not do a blessing over the dead but prayers for the sick and other blessings. Chaplain Maggie responded to the death, prayed and relayed message to nurse's station per father vincent.

## 2021-07-07 NOTE — Progress Notes (Signed)
   Jul 08, 2021 0720  Clinical Encounter Type  Visited With Patient and family together  Visit Type Critical Care  Referral From Nurse  Spiritual Encounters  Spiritual Needs Grief support  Chaplain Sophronia Simas was notified of critical condition of Pt and family presence. Family was preparing for possible end of life, so Luna Fuse gathered family and offered prayer at the bedside at family request. Pt responded and began to speak. Luna Fuse made space for additional family as they arrived. Chaplain also communicated within oncoming shift staff to make them aware of family presence and the ongoing uncertainty of status. Chaplain Burris alerted [employer] at [employer] of ongoing need for family support.

## 2021-07-07 NOTE — Anesthesia Postprocedure Evaluation (Signed)
Anesthesia Post Note  Patient: [name]  Procedure(s) Performed: LAMINECTOMY WITH POSTERIOR LATERAL ARTHRODESIS LEVEL 1 (Bilateral) POSTERIOR THORACIC FUSION 4 LEVELS POSTERIOR LUMBAR FUSION 4 LEVEL (Bilateral)  Patient location during evaluation: ICU Anesthesia Type: General Level of consciousness: awake Respiratory status: spontaneous breathing Cardiovascular status: unstable Anesthetic complications: no Comments: Arrived to ICU7  Multiple family members at bedside.  MD surgeon Marcell Barlow gave me report on patient.  Pt extubated.  Reported that pt coded around 0545 and revived and stabilized on max pressor support.  Withdrawal of support being considered by patient and family at this time.     No notable events documented.   Last Vitals:  Vitals:   14-Jun-2021 0450 14-Jun-2021 0600  BP:  (!) 198/109  Pulse: 84 98  Resp: 13 16  Temp:    SpO2: 94% 99%    Last Pain:  Vitals:   Jun 14, 2021 0000  TempSrc: Axillary  PainSc:                  Lyn Records

## 2021-07-07 NOTE — Progress Notes (Signed)
   07/12/2021 1025  Clinical Encounter Type  Visited With Patient and family together  Visit Type Initial;Spiritual support;Social support;Critical Care;Patient actively dying  Referral From Nurse  Consult/Referral To Chaplain  Spiritual Encounters  Spiritual Needs Grief support;Emotional;Ritual   PT placed on comfort care. Chaplain met with family who was all around the room. Compassionate presence, hospitality, and reflective listening were all components of theis visit. Chaplain will contact [place] priest for blessing of the sick ritual.

## 2021-07-07 NOTE — Progress Notes (Signed)
Patient has elected to move to comfort care, stated that he would like to have pain medication, but no other medications at this time. He would like to have vasopressors stopped. Dr. Jayme Cloud notified.

## 2021-07-07 NOTE — Consult Note (Signed)
NAME:  [name], MRN:  [id number], DOB:  1945-09-26, LOS: 1 ADMISSION DATE:  07/02/2021, CONSULTATION DATE: 06/17/21 REFERRING MD: Dr. Cari Caraway, CHIEF COMPLAINT: Back pain & neck pain post fall  History of Present Illness:  75 year old male presenting to [facility] ED from home with complaints of back and neck pain status post a mechanical fall at home 2 days prior on 06/10/2021.  Patient came to ED on 06/10/2021 post this fall and CT head was negative for acute intracranial abnormality and CT C-spine showed anterior wedging of the C6 vertebral body with discontinuity along the anterior osteophytes at the C5-C6 level along with mild interspinous splaying at the C5-C6 level which was considered as a likely old chronic change. X-ray of the lumbar spine was negative for acute fracture at this time and chest x-ray showed mild wedging at the thoracolumbar junction with age indeterminate. He returned for assessment of worsening pain in his lower back as well as worsening weakness in his legs.  Patient has also not had a bowel movement in 2 or 3 days.  On arrival he denied any interim injuries or falls, headache, vision changes, vertigo, chest pain, cough, fever, shortness of breath, abdominal pain/ vomiting or other clear associated sick symptoms other than some bruising that seem to develop around his eyes over the last 1 to 2 days. ED course: MRI of the L-spine and C-spine showed unstable L1 vertebral fracture and C5-C6 unstable C-spine fracture, as well as a C6 epidural hematoma. Patient was admitted to the hospitalist service, with Dr. Cari Caraway of neurosurgery consulted.  Patient is on Coumadin which was reversed prior to surgery.  Patient was taken to the OR emergently. medications given: Kcentra Initial Vitals: 96.9, 22, 86, 174/83 and SPO2 94% on room air Significant labs: (Labs/ Imaging personally reviewed) Chemistry: Na+: 137, K+: 4.7, BUN/Cr.:  25/1.06, Serum CO2: 27 Hematology: WBC: 19.9, Hgb:  10.3,  COVID-19 & Influenza A/B: Negative ABG: 7.32/43/112/22  CT thoracic spine without contrast 07/04/2021: Questionable fracture through flowing anterior endplate osteophytes at C5-C6 in the lower cervical spine.  Diffuse spinal ankylosis with superior L1 level fracture redemonstrated no superimposed thoracic spine fracture.  Small bilateral layering pleural effusions with a lung base atelectasis CT lumbar spine without contrast 06/09/2021: Distracted horizontal fracture through the superior L 1 level, underlying spinal ankylosis and osteopenia no other acute osseous abnormality in the lumbar spine MRI cervical spine without contrast 06/21/2021: Acute C5-C6 fracture through the ankylosed spine corresponding to the abnormality on earlier thoracic and cervical CTs. no significant spondylolithiasis but associated dorsal epidural hemorrhage resulting in spinal stenosis with spinal cord mass-effect from C4-C7.  No definite cord contusion or edema.  Extensive midline suboccipital and bilateral paraspinal muscle edema/injury.  Patient was emergently taken to the OR and was admitted to ICU status post posterior segmental instrumentation C2-T2, posterior lateral arthrodesis from C2-T2, right cervical hemilaminectomy from C5-C6 for evacuation of epidural hematoma, open reduction and internal fixation of C6 fracture.  Open reduction and internal fixation of L1 fracture, posterior lateral arthrodesis T10-L4 and posterior segmental instrumentation T10-L4.  Patient was admitted to ICU under hospitalist service, during the course of the evening patient became less responsive.  Dr. Cari Caraway with neurosurgery was called urgently to the bedside and additional vasopressors were added to maintain MAP > 80 for appropriate cerebral perfusion.  Patient's INR which had been reversed with Kcentra prior to surgery also became elevated postsurgery, additionally Kcentra ordered. Stat CT cervical and lumbar spine ordered and PCCM  consulted to assist with vasopressor management.  Pertinent  Medical History  Hypertension Stroke Hyperlipidemia Atrial fibrillation on Coumadin CKD stage II Significant Hospital Events: Including procedures, antibiotic start and stop dates in addition to other pertinent events   2021/06/21: Patient emergently taken to the OR for intervention due to: cervical spinal instability, closed unstable burst fracture at C6, epidural hematoma of the cervical spine, 3 column injury C6, lumbar spinal instability, 3 column unstable spinal injury at L1, closed unstable burst fracture at l1.  Patient extubated postprocedure and transferred to ICU 06/25/2021: Overnight patient became more lethargic, losing sensation in his fingers and legs.  PCCM consulted to assist with BP management while neurosurgery obtain stat CT and MRI imaging  Interim History / Subjective:  Patient lethargic but responsive, able to follow commands. Upon neuro assessment sensation diminished in bilateral fingers with right side more numb than left.  Patient was able to move bilateral feet and toes but not to command, no sensation in legs or feet at this time.  Objective   Blood pressure 125/64, pulse 83, temperature 97.6 F (36.4 C), temperature source Axillary, resp. rate 15, height 5\' 11"  (1.803 m), weight 97.1 kg, SpO2 95 %.        Intake/Output Summary (Last 24 hours) at 06/10/2021 0445 Last data filed at 06/22/2021 0200 Gross per 24 hour  Intake 4650 ml  Output 3045 ml  Net 1605 ml   Filed Weights   06/11/21 2336  Weight: 97.1 kg    Examination: General: Adult male, critically ill, lying in bed, NAD HEENT: MM pink/moist, anicteric, traumatic bruising and edema from mechanical fall PTA, neck supple Neuro: A&O x 3, lethargic-able to follow commands, PERRL +4, reduced sensation in fingertips bilaterally with right fingers more numb than left, no sensation in bilateral lower extremities still movement in bilateral feet and  toes but not to command CV: s1s2 RRR, NSR on monitor, no r/m/g Pulm: Regular, non labored on room air, breath sounds clear/diminished-BUL & diminished-BLL GI: soft, rounded, non tender, bs x 4 GU: foley in place with clear yellow urine Skin: Scattered ecchymosis, edema from fall  Extremities: warm/dry, pulses + 2 R/P Resolved Hospital Problem list     Assessment & Plan:  Unstable L1 vertebral fracture and C5-C6 cervical spine fracture status post open reduction and fixation Epidural hematoma of the cervical spine status post right cervical hemilaminectomy for epidural hematoma evacuation Postoperative hypotension -Neurosurgery consulted> stat CT cervical spine and lumbar spine ordered per their expertise, with likely need for MRI if no clot is evident on CT -Agree with additional Kcentra dose to reverse INR -Maintain MAP > 80, continue phenylephrine drip add Levophed drip as needed -Monitor INR closely -Continue IV fluid 75 mL an hour -Frequent neuro checks -Palliative care consult  Chronic Atrial Fibrillation  CVA -Coumadin on hold in the setting of neurosurgery -Outpatient atenolol on hold in the setting of hypotension requiring vasopressor support - Continuous cardiac monitoring  CKD stage II -Daily BMP, replace electrolytes as needed -Strict I&O's -Gentle IV fluid hydration  Best Practice (right click and "Reselect all SmartList Selections" daily)  Diet/type: NPO DVT prophylaxis: SCD GI prophylaxis: N/A Lines: Arterial Line and yes and it is still needed Foley:  Yes, and it is still needed Code Status:  DNR Last date of multidisciplinary goals of care discussion [Jun 21, 2021]  Labs   CBC: Recent Labs  Lab 06/10/21 1258 21-Jun-2021 0755 Jun 21, 2021 1800 2021-06-21 2045 07/01/2021 0249  WBC 14.7* 12.8* 13.8*  --  19.9*  NEUTROABS 12.0* 8.9*  --   --   --   HGB 15.7 15.1 12.8* 10.7* 10.3*  HCT 46.7 44.6 37.4* 32.3* 30.3*  MCV 94.7 94.3 94.4  --  94.7  PLT 178 135* 162  --   146*    Basic Metabolic Panel: Recent Labs  Lab 06/10/21 1258 07/03/2021 0755 07-08-21 0249  NA 138 136 137  K 4.0 4.0 4.7  CL 103 102 112*  CO2 28 26 27   GLUCOSE 232* 153* 174*  BUN 22 25* 25*  CREATININE 1.16 1.26* 1.06  CALCIUM 8.7* 8.8* 8.0*   GFR: Estimated Creatinine Clearance: 72.6 mL/min (by C-G formula based on SCr of 1.06 mg/dL). Recent Labs  Lab 06/10/21 1258 06/11/2021 0755 06/24/2021 1800 July 08, 2021 0249  WBC 14.7* 12.8* 13.8* 19.9*    Liver Function Tests: Recent Labs  Lab 06/10/21 1258  AST 59*  ALT 44  ALKPHOS 113  BILITOT 1.2  PROT 7.5  ALBUMIN 3.5   No results for input(s): LIPASE, AMYLASE in the last 168 hours. No results for input(s): AMMONIA in the last 168 hours.  ABG    Component Value Date/Time   PHART 7.32 (L) 07/05/2021 2015   PCO2ART 43 06/30/2021 2015   PO2ART 112 (H) 06/11/2021 2015   HCO3 22.2 06/30/2021 2015   ACIDBASEDEF 3.8 (H) 07/05/2021 2015   O2SAT 98.0 06/25/2021 2015     Coagulation Profile: Recent Labs  Lab 06/19/2021 0755 06/25/2021 1138 07/08/2021 0023 07/08/2021 0249  INR 2.3* 1.3* 2.1* 2.2*    Cardiac Enzymes: No results for input(s): CKTOTAL, CKMB, CKMBINDEX, TROPONINI in the last 168 hours.  HbA1C: Hgb A1c MFr Bld  Date/Time Value Ref Range Status  06/01/2021 09:55 AM 6.9 (H) 4.8 - 5.6 % Final    Comment:             Prediabetes: 5.7 - 6.4          Diabetes: >6.4          Glycemic control for adults with diabetes: <7.0   11/30/2020 10:00 AM 6.3 (H) 4.8 - 5.6 % Final    Comment:             Prediabetes: 5.7 - 6.4          Diabetes: >6.4          Glycemic control for adults with diabetes: <7.0     CBG: Recent Labs  Lab 06/12/21 2146 2021/07/08 0005  GLUCAP 206* 187*    Review of Systems: Positives in BOLD  Gen: Denies fever, chills, weight change, fatigue, night sweats HEENT: Denies blurred vision, double vision, hearing loss, tinnitus, sinus congestion, rhinorrhea, sore throat, neck stiffness,  dysphagia PULM: Denies shortness of breath, cough, sputum production, hemoptysis, wheezing CV: Denies chest pain, edema, orthopnea, paroxysmal nocturnal dyspnea, palpitations GI: Denies abdominal pain, nausea, vomiting, diarrhea, hematochezia, melena, constipation, change in bowel habits GU: Denies dysuria, hematuria, polyuria, oliguria, urethral discharge Endocrine: Denies hot or cold intolerance, polyuria, polyphagia or appetite change Derm: Denies rash, dry skin, scaling or peeling skin change Heme: Denies easy bruising, bleeding, bleeding gums Neuro: Denies headache, numbness, weakness, slurred speech, loss of memory or consciousness  Past Medical History:  He,  has a past medical history of Allergy, Hypertension, and Stroke ([place]).   Surgical History:   Past Surgical History:  Procedure Laterality Date   HERNIA REPAIR       Social History:   reports that he has never smoked. He has never used  smokeless tobacco. He reports that he does not drink alcohol and does not use drugs.   Family History:  His family history includes Stroke in his father.   Allergies Allergies  Allergen Reactions   Tegaderm Ag Mesh [Silver] Other (See Comments)    Causes blisters- per patient    Latex      Home Medications  Prior to Admission medications   Medication Sig Start Date End Date Taking? Authorizing Provider  atenolol (TENORMIN) 25 MG tablet Take 1 tablet (25 mg total) by mouth daily. 06/01/21  Yes Juline Patch, MD  lisinopril (ZESTRIL) 40 MG tablet Take 1 tablet (40 mg total) by mouth daily. 06/01/21  Yes Juline Patch, MD  fluticasone (FLONASE) 50 MCG/ACT nasal spray Place 1 spray into both nostrils daily. 06/01/21   Juline Patch, MD  Lidocaine (HM LIDOCAINE PATCH) 4 % PTCH Apply 1 application topically every 12 (twelve) hours as needed. Patient not taking: Reported on 06/16/2021 06/10/21   Nena Polio, MD  oxyCODONE (ROXICODONE) 5 MG immediate release tablet Take 1 tablet (5  mg total) by mouth every 8 (eight) hours as needed for up to 3 days. 06/11/21 06/14/21  Rada Hay, MD  warfarin (COUMADIN) 1 MG tablet TAKE 1 TABLET AS DIRECTED (TAKE 4 MG ON MONDAY, WEDNESDAY, AND FRIDAY AND 3 MG THE REST OF THE WEEK) Patient not taking: No sig reported 07/12/20   Juline Patch, MD  warfarin (COUMADIN) 3 MG tablet TAKE 1 TABLET DAILY OR AS DIRECTED Patient taking differently: Take 6 mg by mouth one time only at 4 PM. Pt taking two 3mg  tabs daily 06/09/20   Juline Patch, MD  warfarin (COUMADIN) 4 MG tablet TAKE 1 TABLET DAILY OR AS DIRECTED Patient not taking: No sig reported 11/30/20   Juline Patch, MD     Critical care time: 55 minutes       Domingo Pulse Rust[place], AGACNP-BC Acute Care Nurse Practitioner [facility]   [phone] / [phone] Please see Amion for pager details.

## 2021-07-07 NOTE — Progress Notes (Addendum)
CT reviewed after images acquired - no implant complications.  No obvious hematoma.    MRI then obtained and reviewed by me - the epidural hematoma has reaccumulated at C5-6.  Final radiologist review pending.  Per RN - En route back to the ICU, [name] became unresponsive.  He was previously interactive with her and with me and the other providers.  I was present when [name] returned to ICU around [phone]. He was examined by me and by Ms. Rust-Arnitra Sokoloski (NP in ICU).   My exam:  Does not OE. No regard, no fc  R pupil 6 to 5, L ovoid and 3 mm.  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 Cannot rotate neck enough for doll's response.  Sonorous breathing.  Nonresponsive to noxious stimuli.  Shortly after my exam, he lost pulse and became acutely hypotensive.  His pulse did recover but he remains nonresponsive and hypotensive.  - Will provide supportive care.  He is not stable enough to consider epidural hematoma evacuation.  He is DNR so intubation will not be pursued. - It is unclear what the cause of his change in status is.  He may have subdural hematoma, stroke, or other proximate cause of his hypotension. CT Head is indicated, but he is not stable enough at this point. - His condition is grave.  I have called his daughter and asked her to come to the hospital.  Mark Night MD

## 2021-07-07 NOTE — Progress Notes (Signed)
MEDICATION RELATED CONSULT NOTE  Pharmacy Consult for [facility] Indication: Elevated INR post-op  Allergies  Allergen Reactions   Tegaderm Ag Mesh [Silver] Other (See Comments)    Causes blisters- per patient    Latex     Patient Measurements: Height: 5\' 11"  (180.3 cm) Weight: 97.1 kg (214 lb) IBW/kg (Calculated) : 75.3  Vital Signs: Temp: 97.6 F (36.4 C) (11/07 0000) Temp Source: Axillary (11/07 0000) BP: 125/64 (11/07 0015) Pulse Rate: 83 (11/07 0115) Intake/Output from previous day: 11/06 0701 - 11/07 0700 In: 4650 [I.V.:4300; IV Piggyback:350] Out: 3045 [Urine:1300; Drains:445; Blood:1300] Intake/Output from this shift: Total I/O In: 850 [I.V.:500; IV Piggyback:350] Out: 940 [Urine:245; Drains:445; Blood:250]  Labs: Recent Labs    06/10/21 1258 Jun 16, 2021 0755 2021/06/16 1800 2021/06/16 2045  WBC 14.7* 12.8* 13.8*  --   HGB 15.7 15.1 12.8* 10.7*  HCT 46.7 44.6 37.4* 32.3*  PLT 178 135* 162  --   CREATININE 1.16 1.26*  --   --   ALBUMIN 3.5  --   --   --   PROT 7.5  --   --   --   AST 59*  --   --   --   ALT 44  --   --   --   ALKPHOS 113  --   --   --   BILITOT 1.2  --   --   --    Estimated Creatinine Clearance: 61.1 mL/min (A) (by C-G formula based on SCr of 1.26 mg/dL (H)).   Microbiology: Recent Results (from the past 720 hour(s))  Resp Panel by RT-PCR (Flu A&B, Covid) Nasopharyngeal Swab     Status: None   Collection Time: 2021/06/16 11:38 AM   Specimen: Nasopharyngeal Swab; Nasopharyngeal(NP) swabs in vial transport medium  Result Value Ref Range Status   SARS Coronavirus 2 by RT PCR NEGATIVE NEGATIVE Final    Comment: (NOTE) SARS-CoV-2 target nucleic acids are NOT DETECTED.  The SARS-CoV-2 RNA is generally detectable in upper respiratory specimens during the acute phase of infection. The lowest concentration of SARS-CoV-2 viral copies this assay can detect is 138 copies/mL. A negative result does not preclude SARS-Cov-2 infection and should not be  used as the sole basis for treatment or other patient management decisions. A negative result may occur with  improper specimen collection/handling, submission of specimen other than nasopharyngeal swab, presence of viral mutation(s) within the areas targeted by this assay, and inadequate number of viral copies(<138 copies/mL). A negative result must be combined with clinical observations, patient history, and epidemiological information. The expected result is Negative.  Fact Sheet for Patients:  13/06/22  Fact Sheet for Healthcare Providers:  BloggerCourse.com  This test is no t yet approved or cleared by the [employer] FDA and  has been authorized for detection and/or diagnosis of SARS-CoV-2 by FDA under an Emergency Use Authorization (EUA). This EUA will remain  in effect (meaning this test can be used) for the duration of the COVID-19 declaration under Section 564(b)(1) of the Act, 21 U.S.C.section 360bbb-3(b)(1), unless the authorization is terminated  or revoked sooner.       Influenza A by PCR NEGATIVE NEGATIVE Final   Influenza B by PCR NEGATIVE NEGATIVE Final    Comment: (NOTE) The Xpert Xpress SARS-CoV-2/FLU/RSV plus assay is intended as an aid in the diagnosis of influenza from Nasopharyngeal swab specimens and should not be used as a sole basis for treatment. Nasal washings and aspirates are unacceptable for Xpert Xpress SARS-CoV-2/FLU/RSV testing.  Fact Sheet for Patients: BloggerCourse.com  Fact Sheet for Healthcare Providers: [employer]ious[employer]ker[employer]it  This test is not yet approved or cleared by the [place] FDA and has been authorized for detection and/or diagnosis of SARS-CoV-2 by FDA under an Emergency Use Authorization (EUA). This EUA will remain in effect (meaning this test can be used) for the duration of the COVID-19 declaration under Section  564(b)(1) of the Act, 21 U.S.C. section 360bbb-3(b)(1), unless the authorization is terminated or revoked.  Performed at [facility], [address] [place], [place] [address]     Medical History: Past Medical History:  Diagnosis Date   Allergy    Hypertension    Stroke [facility])     Medications:  PTA:  Warfarin 11/6 1104 Kcentra 1593 units given preop 11/6 1138 INR 1.3  Assessment: Pharmacy contacted by Dr Toniann Fail regarding concerns about post-op bleed risk due to increase of pt's INR 2.2 (11/7 0249).   Goal of Therapy:  Subtherapeutic INR  Plan:  Reordered additional Kentra 1593 units x 1, given @ 0421 prior to AM INR schedule for 0500 x 2 days.  Otelia Sergeant, PharmD, [facility] 06/09/2021 6:33 AM

## 2021-07-07 NOTE — Progress Notes (Addendum)
    Attending Progress Note  History: [name] is here for 2 different spinal injuries, at C6 and L1.  He had an epidural hematoma at C5-6 in addition to an unstable spinal fracture.  He had an obviously unstable L1 fracture as well.  POD1: At around 0200, he was noted to have a worsened neurological examination.  He developed worsening hand function and lost ability to feel his legs and hands.  The RN contacted me for guidance.  I have now evaluated the patient and initiated imaging evaluation.  Physical Exam: Vitals:   2021-07-10 0100 07-10-21 0115  BP:    Pulse: 75 83  Resp: 14 15  Temp:    SpO2: 92% 95%    OE spontaneously, regards, follows commands. CN grossly intact  UE:  Biceps 3/3 Triceps 3/3 Grip 1/1 Wrist Extension 1/1 Wrist Flexion 1/1  LE:  IP 1/1 KE 1/1 KF 1/1 DF 1/1 PF 1/1  Sensory examinations shows altered sensation below the shoulders, without ability to feel his hands on either side.  He has preserved light touch sensation from C6 and above.  Data:  Recent Labs  Lab 06/10/21 1258 07/03/2021 0755  NA 138 136  K 4.0 4.0  CL 103 102  CO2 28 26  BUN 22 25*  CREATININE 1.16 1.26*  GLUCOSE 232* 153*  CALCIUM 8.7* 8.8*   Recent Labs  Lab 06/10/21 1258  AST 59*  ALT 44  ALKPHOS 113     Recent Labs  Lab 06/10/21 1258 06/19/2021 0755 06/07/2021 1800 06/08/2021 2045  WBC 14.7* 12.8* 13.8*  --   HGB 15.7 15.1 12.8* 10.7*  HCT 46.7 44.6 37.4* 32.3*  PLT 178 135* 162  --    Recent Labs  Lab 06/15/2021 0755 06/15/2021 1138 07/10/21 0023  INR 2.3* 1.3* 2.1*         Other tests/results: n/a  Assessment/Plan:  [name] is POD1 from extension cervical and thoracolumbar intervention for an unstable C6 burst fracture with epidural hematoma as well as an unstable 3 column injury at L1.  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  He had a long post-anesthesia recovery with some issues with his blood pressure.  There is some possibility that the epidural hematoma in his neck prior to surgery contributed to his presentation as well, as he is now awake enough for a full examination.  - Continue ICU care. Support MAP above 80. - STAT CT scan of C and L spine - Likely will need MRI if no clot is evident on CT.   - Monitor INR closely - will need correction as his INR has drifted back out of the normal range.   Venetia Night MD, [facility] Department of Neurosurgery

## 2021-07-07 NOTE — Progress Notes (Signed)
Patient passed at 1115 surrounded by family. Pronounced by this RN and Vergia Alberts, RN

## 2021-07-07 NOTE — Progress Notes (Signed)
0000 Patient arrival to unit  on NEO. Patient alert and oriented  Able to move both arms and hands Grip weak but present.  Able to push down bilaterally with feet and move toes. Soreness in arms and neck.   0200 Patient Alert and oriented.Patient numb throughout arms. Weaker grip and unable to spread fingers out. Patient unable to feel legs or move them.  0230 Dr Cari Caraway contacted regarding change in neuro status.  Am labs requested early, CT and MRI scan ordered, and ke centra ordered.   [place] Dr Cari Caraway at bedside assessing patient.  Updated on patients AM labs.   0400 Trip to CT with no incidents.  0500 trip to MRI patient became unresponsive on the way back from scan. BP 133/97 HR 88 RR 17 O2 97 on RA. Glucose 189 on arrival. NP Rust- chester and MD Izora Ribas met patient on arrival back to unit.  After raising patient up patient became hypotensive and brady down to 25. Patient had pupil changes R pupil 6 and L ovoid and 62m no reflexes.   0630 Patient became responsive when family arrived to room.  Patient requiring Levo and NEO to support BP

## 2021-07-07 NOTE — Progress Notes (Addendum)
I spoke with the patient's son as well as Dr. Jayme Cloud.  I reviewed the events with both independently.  Mr. Svec has made some progress, but remains critically ill and unstable for additional decompression.  MRI has now been read and shows improved spinal canal patency per the radiologist.    I am still concerned about the epidural hematoma, but cannot recommend exploration given his instability.  Dr. Jayme Cloud will continue to follow and help manage.  She advised to consult palliative care, which may be helpful for the patient and family.  - Continue supportive care. MAP goal >80 while full goals of care are in place. - He may survive this, but his neurologic recovery is uncertain at this point.   - I have updated my partner Dr. Adriana Simas, who is covering call today and tomorrow and is available for emergencies as well.   Venetia Night MD

## 2021-07-07 NOTE — Death Summary Note (Addendum)
DEATH SUMMARY   Patient Details  Name: [name] MRN: [id number] DOB: May 04, 1946  Admission/Discharge Information   Admit Date:  2021-06-21  Date of Death: Date of Death: 22-Jun-2021  Time of Death: Time of Death: 61  Length of Stay: 1  Referring Physician: Juline Patch, MD   Reason(s) for Hospitalization  Trauma due to mechanical fall with unstable C-spine and L-spine fractures  Diagnoses  Preliminary cause of death:  Neurogenic shock  Secondary Diagnoses (including complications and co-morbidities):  Principal Problem:   Cervical spine fracture C5-C6 Active Problems:   Closed L1 vertebral fracture (HCC)   Epidural hematoma   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  Patient came to ED on 06/10/2021 post this fall and CT head was negative for acute intracranial abnormality and CT C-spine showed anterior wedging of the C6 vertebral body with discontinuity along the anterior osteophytes at the C5-C6 level along with mild interspinous splaying at the C5-C6 level which was considered as a likely old chronic change. X-ray of the lumbar spine was negative for acute fracture at this time and chest x-ray showed mild wedging at the thoracolumbar junction with age indeterminate. He returned for assessment of worsening pain in his lower back as well as worsening weakness in his legs.  Patient has also not had a bowel movement in 2 or 3 days.  On arrival he denied any  interim injuries or falls, headache, vision changes, vertigo, chest pain, cough, fever, shortness of breath, abdominal pain/ vomiting or other clear associated sick symptoms other than some bruising that seem to develop around his eyes over the last 1 to 2 days.  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  ED course: MRI of the L-spine and C-spine showed unstable L1 vertebral fracture and C5-C6 unstable C-spine fracture, as well as a C6 epidural hematoma. Patient was admitted to the hospitalist service, with Dr. Cari Caraway of neurosurgery consulted.  Patient is on Coumadin which was reversed prior to surgery.  Patient was taken to the OR emergently. medications given: [facility] events: Jun 21, 2021: Patient emergently taken to the OR for intervention due to: cervical spinal instability, closed unstable burst fracture at C6, epidural hematoma of the cervical spine, 3 column injury C6, lumbar spinal instability, 3 column unstable spinal injury at L1, closed unstable burst fracture at l1.  Patient extubated postprocedure and transferred to ICU 06/22/2021: Overnight patient became more lethargic, losing sensation in his fingers and legs.  PCCM consulted to assist with BP management while neurosurgery obtain stat CT and MRI imaging.   Patient became lethargic but was able to follow some commands when aroused.  He had persistent sensation of both upper and lower extremities.  He lost sensation of his extremities.  Family gathered.  Patient became more interactive with family.  He had made it clear that he was DNR/DNI and also requested transition to comfort measures.  Family was in agreement with this.  Patient's support at that time consisted of vasopressors at a modest dose for neurogenic shock.  Patient was transitioned to comfort measures and subsequently expired at 1115 hours on 07-03-21.  Family was at  bedside.    Pertinent Labs and Studies  Significant Diagnostic Studies DG Chest 2 View  Result Date: 06/10/2021 CLINICAL DATA:  dizziness, weakness EXAM: CHEST - 2 VIEW COMPARISON:  None. FINDINGS: The cardiomediastinal silhouette is mildly enlarged in contour.Low lung volumes. No pleural effusion. No pneumothorax. Bibasilar platelike opacities most consistent with atelectasis. Visualized abdomen is unremarkable. Wedging at the thoracolumbar junction, age indeterminate. IMPRESSION: 1. Low lung volume film with bibasilar atelectasis. 2. Mild wedging at the thoracolumbar junction, age indeterminate. Recommend correlation with point tenderness. Electronically Signed   By: Valentino Saxon M.D.   On: 06/10/2021 13:54   DG Lumbar Spine 2-3 Views  Result Date: 07/06/2021 CLINICAL DATA:  Laminectomy with posterior-lateral arthrodesis. Posterior fusion 4 levels. EXAM: LUMBAR SPINE - 2-3 VIEW; DG C-ARM 1-60 MIN COMPARISON:  Preoperative imaging. FINDINGS: Four fluoroscopic spot views of the lumbar spine obtained in the operating room. Pedicle screws at T10, T11, T12, L2, L3, and L4. Posterior rod is demonstrated on the right on provided images. Fluoroscopy time 12 seconds. IMPRESSION: Fluoroscopic spot views during spinal fusion. Electronically Signed   By: Keith Rake M.D.   On: 06/25/2021 20:31   DG Lumbar Spine 2-3 Views  Result Date: 06/10/2021 CLINICAL DATA:  Fall, pain EXAM: LUMBAR SPINE - 2-3 VIEW COMPARISON:  None. FINDINGS: Diffuse ankylosis of the lumbar spine. No evident fracture or dislocation. Moderate to severe multilevel facet degenerative change. Nonobstructive pattern of overlying bowel gas. IMPRESSION: 1. 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 2. No evident fracture or dislocation. CT or MRI may be more sensitive for subtle fracture if there is high clinical suspicion. Electronically Signed   By: Delanna Ahmadi M.D.   On: 06/10/2021 13:52   CT Head  Wo Contrast  Result Date: 06/10/2021 CLINICAL DATA:  Fall, head trauma EXAM: CT HEAD WITHOUT CONTRAST TECHNIQUE: Contiguous axial images were obtained from the base of the skull through the vertex without intravenous contrast. COMPARISON:  None. FINDINGS: Brain: No evidence of acute infarction, hemorrhage, hydrocephalus, extra-axial collection or mass lesion/mass effect. Vascular: No hyperdense vessel or unexpected calcification. Skull: Normal. Negative for fracture or focal lesion. Sinuses/Orbits: No acute finding. Other: Soft tissue contusion of the right forehead. IMPRESSION: 1. No acute intracranial pathology. 2. Soft tissue contusion of the right forehead. Electronically Signed   By: Delanna Ahmadi M.D.   On: 06/10/2021 14:21   CT CERVICAL SPINE WO CONTRAST  Result Date: 03-Jul-2021 CLINICAL DATA:  75 year old male with spinal ankylosis, C5-C6 and L1 acute fractures. Status post surgery. EXAM: CT CERVICAL SPINE WITHOUT CONTRAST TECHNIQUE: Multidetector CT imaging of the cervical spine was performed without intravenous contrast. Multiplanar CT image reconstructions were also generated. COMPARISON:  Cervical spine MRI 06/30/2021, CT 06/10/2021. FINDINGS: Alignment: Normalized cervical lordosis. Cervicothoracic junction alignment is within normal limits. Relatively normal posterior element alignment bilaterally. Skull base and vertebrae: Visualized skull base is intact. No atlanto-occipital dissociation. Postoperative changes are detailed below. C1 and C2 appears stable and intact. C3 and C4 appear intact, ankylosed. Horizontal fracture through the C5-C6 level, including anterior bridging osteophytes and the C6 superior endplate. Mild widening of both C5-C6 facets now, but improved alignment. C7 and T1 appear intact, ankylosed. Soft tissues and spinal canal: Spinal epidural hematoma seen by MRI yesterday is not apparent. There  is mild prevertebral soft tissue edema. Postoperative changes to the posterior  paraspinal soft tissues are noted including a small volume of soft tissue gas. And a postoperative drain is in place tracking up from the thoracic spine, looping along the cervical lamina and spinous processes. Otherwise negative visible noncontrast neck soft tissues. Disc levels:  C1-C2: Maintained alignment. C2-C3: Bilateral posterior laminar hardware is new. No adverse features. C3-C4: Bilateral laminar hardware is new. Right C4 laminectomy. No adverse features. C4-C5: Bilateral laminar hardware is new. Right C5 laminectomy. No adverse features. C5-C6:  Bilateral laminar hardware is new.  No adverse features. C6-C7:  C6 laminar hardware.  No hardware at C7. C7-T1: No hardware at C7. Bilateral pedicle screws at both T1 and T2. At both levels the left-side pedicle screws are lateral to the pedicles but traversing the posterior elements. The right T1 pedicle screw is slightly lateral but seated. Upper chest: Negative visible noncontrast posterior fossa. Other: Negative visible lung apices. IMPRESSION: 1. C5-C6 unstable fracture through ankylosed spine with interval right side laminectomy at both levels, and placement of posterior C2 through T2 fusion hardware. Normalized cervical lordosis. Slightly lateral positioning of T1 and T2 pedicle screws. No other adverse features. 2. Posterior postoperative drain in place, tracking up from the thoracic spine level. 3. No new osseous abnormality identified. Electronically Signed   By: Odessa Fleming M.D.   On: 06/27/2021 04:15   CT Cervical Spine Wo Contrast  Result Date: 06/10/2021 CLINICAL DATA:  Fall on concrete.  Anticoagulation. EXAM: CT CERVICAL SPINE WITHOUT CONTRAST TECHNIQUE: Multidetector CT imaging of the cervical spine was performed without intravenous contrast. Multiplanar CT image reconstructions were also generated. COMPARISON:  None. FINDINGS: Alignment: Mild reversal of the normal cervical lordosis. No subluxation identified. Skull base and vertebrae: There is  mild splaying of the interspinous space at C5-6 along with anterior wedging/loss of height of the C6 vertebra and some irregularity/discontinuity along the bridging anterior osteophytes at the C5-6 level. That said, we do not demonstrate sharp discontinuities in the cortex at C6 or in the osteophytes, and accordingly I this is substantially more likely to be from a chronic fracture rather than acute. Given the lack of prior exams, it might be prudent to confirm with MRI. Otherwise there are continuous osteophytes along the anterior vertebral column between C3 and T2. Degenerative spurring loss of articular space at the anterior C1-2 articulation. Soft tissues and spinal canal: There is high density projecting in along the spinal canal at the T2 level and below, believed to be attributable to beam hardening artifact given the similar findings in the surrounding soft tissues, but if the patient has a profound or progressive neurologic deficit then this may warrant further investigation such as with thoracic spine MRI. Atherosclerotic calcification of the aortic branch vessels. Disc levels: C2-3: Mild left foraminal stenosis due to facet spurring. C3-4: Moderate to prominent left foraminal stenosis due to facet and left eccentric intervertebral spurring. C4-5: No impingement identified. C5-6: No impingement. C6-7: No impingement. C7-T1: No impingement. Upper chest: No additional findings. Other: No supplemental non-categorized findings. IMPRESSION: 1. Anterior wedging of the C6 vertebral body with discontinuity along the anterior osteophytes at the C5-6 level, along with mild interspinous splaying at the C5-6 level. There is a strong likelihood that this is old/chronic given the lack of sharply defined cortical discontinuity and the relative cortication of the osteophytes. Given the lack of prior comparison exams, MRI of the cervical spine without contrast is recommended to confirm the chronicity of  these findings  unless the patient has a known history of remote C6 compression. 2. Otherwise anterior flowing osteophytes between C3 and T2. 3. Left foraminal impingement at C3-4 due to facet and left eccentric intervertebral spurring. Mild left foraminal stenosis at C2-3. 4. Atherosclerosis. Electronically Signed   By: Van Clines M.D.   On: 06/10/2021 14:33   CT THORACIC SPINE WO CONTRAST  Addendum Date: 07/03/2021   ADDENDUM REPORT: 06/30/2021 10:52 ADDENDUM: Study discussed by telephone with neurosurgery Dr. Meade Maw on 06/10/2021 at 1035 hours. He alerted me to the fact that a CT Cervical Spine was already done two days ago on 06/10/2021, and was suspicious for C5-C6 level fracture at that time. Follow-up Cervical Spine MRI is pending, but the early images from that exam confirm acute injury (please see that report) - which Dr. Izora Ribas and I discussed. Electronically Signed   By: Genevie Ann M.D.   On: 07/06/2021 10:52   Result Date: 06/28/2021 CLINICAL DATA:  75 year old male with low back pain after a fall, and fracture the ankylosed spine at the L1 level on MRI this morning. EXAM: CT THORACIC SPINE WITHOUT CONTRAST TECHNIQUE: Multidetector CT images of the thoracic were obtained using the standard protocol without intravenous contrast. COMPARISON:  Lumbar MRI today reported separately. FINDINGS: Limited cervical spine imaging: Partially visible interruption of flowing anterior endplate osteophytes in the anterior cervical spine at C5-C6. However, no obvious disruption of the partially visible posterior elements at that level. Dedicated cervical spine CT would be valuable. Thoracic spine segmentation: Normal, concordant with the lumbar numbering on the earlier MRI. Alignment: Mildly exaggerated thoracic kyphosis. No spondylolisthesis. Vertebrae: Ankylosis from the lower cervical spine through T12 from flowing endplate syndesmophytes. Intermittent bilateral costovertebral junction ankylosis also.  Osteopenia. No thoracic spine fracture identified above T12. Visible posterior ribs appear intact. T12-L1 level fracture as described on the lumbar spine MRI earlier today. Associated disruption of the right facets at that level, and distracted fracture fragments with subtle anterolisthesis. Paraspinal and other soft tissues: Calcified aortic atherosclerosis. Mild respiratory motion. Small bilateral layering pleural effusions. Associated lung base atelectasis. Visible major airways are patent. No pericardial effusion is evident. Negative visible noncontrast upper abdominal viscera Upper lumbar paraspinal muscle atrophy. Disc levels: Mild for age thoracic spine degeneration. No CT evidence of thoracic spinal stenosis. IMPRESSION: 1. Questionable fracture through flowing anterior endplate osteophytes at C5-C6 in the lower cervical spine. Noncontrast Cervical Spine CT (rather than MRI) would be most valuable to further characterize. 2. 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 No superimposed thoracic spine fracture. 3. Small bilateral layering pleural effusions with lung base atelectasis. 4. Aortic Atherosclerosis (ICD10-I70.0). Electronically Signed: By: Genevie Ann M.D. On: 06/19/2021 10:12   CT LUMBAR SPINE WO CONTRAST  Result Date: 2021-06-30 CLINICAL DATA:  75 year old male with spinal ankylosis, C5-C6 and L1 acute fractures. Status post surgery. EXAM: CT LUMBAR SPINE WITHOUT CONTRAST TECHNIQUE: Multidetector CT imaging of the lumbar spine was performed without intravenous contrast administration. Multiplanar CT image reconstructions were also generated. COMPARISON:  Thoracolumbar CT, lumbar MRI yesterday. FINDINGS: Segmentation: Normal. Alignment: Stable since yesterday. Vertebrae: Distracted, horizontal fracture through the superior L1 level appears stable aside from small new volume of gas in an around the fracture cleft. Addition of posterior paraspinal fusion hardware now, see  details below. Underlying osteopenia. Disrupted T12-L1 ankylosis. But maintained L1 through S1 ankylosis with no new osseous abnormality identified. Paraspinal and other soft tissues: Postoperative changes to the posterior paraspinal soft  tissues. A postoperative drain is looped over the T12 and L1 posterior paraspinal muscles, L1 spinous process. It appears to descend from the thoracic level. Small volume of regional soft tissue gas. Stable visible noncontrast abdominal viscera. Disc levels: T12-L1: Bilateral T12 pedicle screws and posterior connecting rods with no adverse features. Some posterior bone graft material also in place. L1-L2:  No L1 hardware.  Posterior, laminar bone graft material. L2-L3: Bilateral L2 and L3 pedicle screws. Posterior bone graft material, primarily at L2. No adverse features. L3-L4: Bilateral L4 pedicle screws. Connecting rods terminates at L4. No adverse features. L4-L5:  Stable aside from L4 hardware. L5-S1:  Stable. IMPRESSION: 1. Spinal ankylosis with stable configuration of superior L1 fracture with distraction. 2. Status post T12 through L4 posterior spinal fusion hardware placement with no adverse features. Posterior postoperative drain in place. 3. No new osseous abnormality in the lumbar spine. Electronically Signed   By: Genevie Ann M.D.   On: 2021-07-13 04:21   CT LUMBAR SPINE WO CONTRAST  Result Date: 06/19/2021 CLINICAL DATA:  75 year old male with low back pain after a fall, and fracture the ankylosed spine at the L1 level on MRI this morning. EXAM: CT LUMBAR SPINE WITHOUT CONTRAST TECHNIQUE: Multidetector CT imaging of the lumbar spine was performed without intravenous contrast administration. Multiplanar CT image reconstructions were also generated. COMPARISON:  Thoracic spine CT today.  Lumbar MRI 0820 hours today. FINDINGS: Segmentation: Normal, concordant with the other thoracic and lumbar numbering today. Alignment: Relatively preserved lumbar lordosis. Vertebrae:  Osteopenia. Incomplete ankylosis of the bilateral SI joints, but flowing endplate osteophytes throughout the lumbar spine resulting in ankylosis, bulky at L4-L5. Partially visible distracted horizontal fracture through the superior L1 level, unchanged from the earlier MRI. Elsewhere the lumbar vertebrae are intact.  Intact visible sacrum. Paraspinal and other soft tissues: Atrophied posterior lumbar paraspinal muscles. Left nephrolithiasis. Aortoiliac calcified atherosclerosis. Negative visible other noncontrast abdominal viscera. Normal appendix is visible. Disc levels: Unchanged from the MRI earlier today. IMPRESSION: 1. Distracted, horizontal fracture through the superior L1 level as seen on the earlier MRI (and more included on the Thoracic Spine CT reported separately). 2. Underlying spinal ankylosis and osteopenia. No other acute osseous abnormality in the lumbar spine. Incomplete SI joint ankylosis. 3. Left nephrolithiasis.  Aortic Atherosclerosis (ICD10-I70.0). Electronically Signed   By: Genevie Ann M.D.   On: 06/24/2021 10:19   MR CERVICAL SPINE WO CONTRAST  Result Date: 07-13-21 CLINICAL DATA:  75 year old male with spinal ankylosis, C5-C6 and L1 acute fractures. Status post surgery. EXAM: MRI CERVICAL SPINE WITHOUT CONTRAST TECHNIQUE: Multiplanar, multisequence MR imaging of the cervical spine was performed. No intravenous contrast was administered. COMPARISON:  Postoperative cervical spine CT 0347 hours today. Preoperative cervical spine MRI yesterday. FINDINGS: Alignment: Normalized cervical lordosis. Vertebrae: Dark marrow signal throughout the visible spine in the setting of chronic ankylosis. Superimposed bilateral posterior paraspinal susceptibility artifact from new laminar hardware. Subtle heterogeneity now in the C5-C6 disc space and C6 superior body. Cord: Improved appearance since yesterday, with decreased but not resolved posterior epidural blood. Residual now most apparent at the C7 level  (up to 6 mm thickness there series 8, image 27 and series 5, image 9. And there is a new small volume of ventral epidural space fluid which could be hematoma or postoperative seroma. This is centered at C5-C6 (series 7, image 9), 1-2 mm in thickness. Some cord detail is diminished due to the posterior susceptibility artifact now. But there is no convincing cord  contusion or edema. Posterior Fossa, vertebral arteries, paraspinal tissues: Cervicomedullary junction is within normal limits. Negative posterior fossa. Postoperative changes to the posterior paraspinal soft tissues. Midline suboccipital and bilateral paraspinal muscle STIR hyperintensity has regressed but not resolved. Disc levels: Improved spinal canal patency. Regressed but not fully resolved spinal stenosis C4-C5 through C7-T1, mild. IMPRESSION: 1. Interval postoperative changes to the cervical spine as seen by CT this morning. Regressed but not resolved posterior epidural blood (residual at C7), small new ventral epidural space hematoma or seroma (C5-C6), and improved spinal canal patency but mild residual spinal stenosis. 2. No convincing spinal cord contusion or edema. 3. Regressed suboccipital and bilateral paraspinal muscle edema. Electronically Signed   By: Genevie Ann M.D.   On: 07-08-2021 06:10   MR CERVICAL SPINE WO CONTRAST  Result Date: 06/19/2021 CLINICAL DATA:  Study discussed by telephone with neurosurgery Dr. Meade Maw on 06/08/2021 at 1035 hours. EXAM: MRI CERVICAL SPINE WITHOUT CONTRAST TECHNIQUE: Multiplanar, multisequence MR imaging of the cervical spine was performed. No intravenous contrast was administered. COMPARISON:  Thoracic spine CT today, and cervical spine CT 06/10/2021. FINDINGS: Alignment: Less reversal of cervical lordosis now. Vertebrae: Abnormal marrow edema in the C6 superior endplate, associated with heterogeneity in the adjacent C5-C6 disc space. In conjunction with the abnormal CT appearance here 2 days ago,  and also on the thoracic spine CT today, this is consistent with acute fracture through the ankylosed spine. Underlying spinal ankylosis better demonstrated by CT. No other marrow edema or acute osseous abnormality. Cord: Spinal stenosis and at least mild spinal cord compression from a long segment heterogeneous dorsal epidural hemorrhage from the C4-C5 through C7-T1 levels. See series 7, image 8 and series 6, image 8 and series 9, image 30. No definite associated cord signal abnormality. Posterior Fossa, vertebral arteries, paraspinal tissues: Severe midline suboccipital and bilateral paraspinal muscle edema. Associated small focal posterior paraspinal hematoma or seroma at the T2 level on series 6, image 3. Cervicomedullary junction is within normal limits. Negative visible posterior fossa. Preserved major vascular flow voids in the neck. 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 IMPRESSION: 1. Acute C5-C6 fracture through the ankylosed spine, corresponding to the abnormality on earlier Thoracic and Cervical CTs. 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 No definite cord contusion or edema. 2. Extensive midline suboccipital and bilateral paraspinal muscle edema/injury. Preliminary report of this exam was discussed by telephone with Neurosurgery Dr. Meade Maw on 06/18/2021 at 1035 hours. Electronically Signed   By: Genevie Ann M.D.   On: 07/04/2021 11:05   MR LUMBAR SPINE WO CONTRAST  Addendum Date: 06/17/2021   ADDENDUM REPORT: 06/17/2021 08:11 ADDENDUM: Study discussed by telephone with Dr. Starleen Blue on 06/12/2021 at 0905 hours. Electronically Signed   By: Genevie Ann M.D.   On: 06/12/2021 08:11   Result Date: 06/12/2021 CLINICAL DATA:  75 year old male with low back pain after a fall. EXAM: MRI LUMBAR SPINE  WITHOUT CONTRAST TECHNIQUE: Multiplanar, multisequence MR imaging of the lumbar spine was performed. No intravenous contrast was administered. COMPARISON:  Lumbar radiographs 06/10/2021. FINDINGS: Segmentation:  Normal on the comparison radiographs. Alignment: Straightening of lumbar lordosis. There is subtle anterolisthesis associated with a 3 column injury of the superior L1 vertebra, see below. Vertebrae: 3 column fracture with edema or hemorrhage tracking through  the bony cleft of the superior L1 body, just subjacent to the L1 superior endplate. Fracture propagates through the bilateral posterior elements as seen on series 7, image 6, including the left T12-L1 facet joint. The left T12 neural foramen is directly involved. The L1 spinous process appears to remain intact. The left L1 pedicle appears to remain intact. Underlying spinal ankylosis. Mild longitudinal distraction of the fracture fragments. Slight anterolisthesis of T12 with respect to L1. No retropulsion or associated spinal stenosis. Lumbar interbody ankylosis suspected elsewhere related to flowing endplate osteophytes or syndesmophytes, which are bulky anteriorly at L4-L5. However, unclear whether there is bilateral SI joint ankylosis. No other marrow edema or acute osseous abnormality. Conus medullaris and cauda equina: Conus extends to the L1 level. No lower spinal cord or conus signal abnormality. Paraspinal and other soft tissues: Only mild paraspinal edema at the L1 fracture level. Bilateral paraspinal muscle atrophy. Negative visible abdominal viscera. 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 No lumbar spinal stenosis. IMPRESSION: 1. 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 The fracture propagates posteriorly through the left T12 neural foramen, left T12-L1 facets, and right L1 posterior elements. Slight anterolisthesis. No retropulsion or spinal  stenosis. Recommend Spine Precautions and Spine Surgery consultation. 2. No other acute finding in the lumbar spine. 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 Electronically Signed: By: Genevie Ann M.D. On: 06/25/2021 07:58   DG C-Arm 1-60 Min  Result Date: 07/01/2021 CLINICAL DATA:  Laminectomy with posterior-lateral arthrodesis. Posterior fusion 4 levels. EXAM: LUMBAR SPINE - 2-3 VIEW; DG C-ARM 1-60 MIN COMPARISON:  Preoperative imaging. FINDINGS: Four fluoroscopic spot views of the lumbar spine obtained in the operating room. Pedicle screws at T10, T11, T12, L2, L3, and L4. Posterior rod is demonstrated on the right on provided images. Fluoroscopy time 12 seconds. IMPRESSION: Fluoroscopic spot views during spinal fusion. Electronically Signed   By: Keith Rake M.D.   On: 06/14/2021 20:31    Microbiology Recent Results (from the past 240 hour(s))  Resp Panel by RT-PCR (Flu A&B, Covid) Nasopharyngeal Swab     Status: None   Collection Time: 06/18/2021 11:38 AM   Specimen: Nasopharyngeal Swab; Nasopharyngeal(NP) swabs in vial transport medium  Result Value Ref Range Status   SARS Coronavirus 2 by RT PCR NEGATIVE NEGATIVE Final    Comment: (NOTE) SARS-CoV-2 target nucleic acids are NOT DETECTED.  The SARS-CoV-2 RNA is generally detectable in upper respiratory specimens during the acute phase of infection. The lowest concentration of SARS-CoV-2 viral copies this assay can detect is 138 copies/mL. A negative result does not preclude SARS-Cov-2 infection and should not be used as the sole basis for treatment or other patient management decisions. A negative result may occur with  improper specimen collection/handling, submission of specimen other than nasopharyngeal swab, presence of viral mutation(s) within the areas targeted by this assay, and inadequate number of viral copies(<138 copies/mL). A negative result must be combined  with clinical observations, patient history, and epidemiological information. The expected result is Negative.  Fact Sheet for Patients:  EntrepreneurPulse.com.au  Fact Sheet for Healthcare Providers:  IncredibleEmployment.be  This test is no t yet approved or cleared by the [place] FDA and  has been authorized for detection and/or diagnosis of SARS-CoV-2 by FDA under an Emergency Use Authorization (EUA). This EUA will remain  in effect (meaning this test can be used) for the duration of  the COVID-19 declaration under Section 564(b)(1) of the Act, 21 U.S.C.section 360bbb-3(b)(1), unless the authorization is terminated  or revoked sooner.       Influenza A by PCR NEGATIVE NEGATIVE Final   Influenza B by PCR NEGATIVE NEGATIVE Final    Comment: (NOTE) The Xpert Xpress SARS-CoV-2/FLU/RSV plus assay is intended as an aid in the diagnosis of influenza from Nasopharyngeal swab specimens and should not be used as a sole basis for treatment. Nasal washings and aspirates are unacceptable for Xpert Xpress SARS-CoV-2/FLU/RSV testing.  Fact Sheet for Patients: EntrepreneurPulse.com.au  Fact Sheet for Healthcare Providers: IncredibleEmployment.be  This test is not yet approved or cleared by the [place] FDA and has been authorized for detection and/or diagnosis of SARS-CoV-2 by FDA under an Emergency Use Authorization (EUA). This EUA will remain in effect (meaning this test can be used) for the duration of the COVID-19 declaration under Section 564(b)(1) of the Act, 21 U.S.C. section 360bbb-3(b)(1), unless the authorization is terminated or revoked.  Performed at [facility], [address] [place], [place] [address]     Lab Basic Metabolic Panel: Recent Labs  Lab 06/10/21 1258 07/01/2021 0755 06/23/21 0249  NA 138 136 137  K 4.0 4.0 4.7  CL 103 102 112*  CO2 28 26 27   GLUCOSE 232*  153* 174*  BUN 22 25* 25*  CREATININE 1.16 1.26* 1.06  CALCIUM 8.7* 8.8* 8.0*   Liver Function Tests: Recent Labs  Lab 06/10/21 1258  AST 59*  ALT 44  ALKPHOS 113  BILITOT 1.2  PROT 7.5  ALBUMIN 3.5   No results for input(s): LIPASE, AMYLASE in the last 168 hours. No results for input(s): AMMONIA in the last 168 hours. CBC: Recent Labs  Lab 06/10/21 1258 06/11/2021 0755 06/14/2021 1800 07/01/2021 2045 June 23, 2021 0249  WBC 14.7* 12.8* 13.8*  --  19.9*  NEUTROABS 12.0* 8.9*  --   --   --   HGB 15.7 15.1 12.8* 10.7* 10.3*  HCT 46.7 44.6 37.4* 32.3* 30.3*  MCV 94.7 94.3 94.4  --  94.7  PLT 178 135* 162  --  146*   Cardiac Enzymes: No results for input(s): CKTOTAL, CKMB, CKMBINDEX, TROPONINI in the last 168 hours. Sepsis Labs: Recent Labs  Lab 06/10/21 1258 07/04/2021 0755 07/04/2021 1800 2021/06/23 0249  WBC 14.7* 12.8* 13.8* 19.9*    Procedures/Operations  Multiple imaging of the C-spine, L-spine and brain Posterior segmental instrumentation C2-T2 Posterior lateral arthrodesis from C2-T2 Right cervical hemilaminectomy from C5-C6 for evacuation of epidural hematoma Open reduction internal fixation C6 fracture     C. Derrill Kay, MD Advanced Bronchoscopy [facility] 23-Jun-2021, 12:03 PM   *This note was dictated using voice recognition software/Dragon.  Despite best efforts to proofread, errors can occur which can change the meaning.  Any change was purely unintentional.

## 2021-07-07 NOTE — Progress Notes (Signed)
After family arrived, I relayed to them the events of the last few hours.  They then went into the room to see him, and he remained hypotensive but began responding to his family.  His speech is strained and slow.  He is moving his LUE.  He is still profoundly hypotensive on pressors.  Will continue supportive care.  He is not stable for EDH evacuation at this point.  It is still unclear about the cause of his change an hour ago.

## 2021-07-07 DEATH — deceased

## 2021-11-30 ENCOUNTER — Ambulatory Visit: Payer: Medicare Other | Admitting: Family Medicine
